# Patient Record
Sex: Female | Born: 1935 | Race: White | Hispanic: No | Smoking: Former smoker
Health system: Southern US, Community
[De-identification: ages and names within clinical notes are randomized; demographics above are authoritative.]

## PROBLEM LIST (undated history)

## (undated) DIAGNOSIS — S329XXA Fracture of unspecified parts of lumbosacral spine and pelvis, initial encounter for closed fracture: Secondary | ICD-10-CM

## (undated) DIAGNOSIS — R739 Hyperglycemia, unspecified: Secondary | ICD-10-CM

## (undated) DIAGNOSIS — E119 Type 2 diabetes mellitus without complications: Secondary | ICD-10-CM

## (undated) DIAGNOSIS — I639 Cerebral infarction, unspecified: Secondary | ICD-10-CM

## (undated) DIAGNOSIS — N200 Calculus of kidney: Secondary | ICD-10-CM

## (undated) DIAGNOSIS — E7800 Pure hypercholesterolemia, unspecified: Secondary | ICD-10-CM

## (undated) DIAGNOSIS — R0902 Hypoxemia: Secondary | ICD-10-CM

## (undated) DIAGNOSIS — I10 Essential (primary) hypertension: Secondary | ICD-10-CM

## (undated) HISTORY — PX: ABDOMINAL HYSTERECTOMY: SHX81

---

## 1999-05-01 ENCOUNTER — Encounter (INDEPENDENT_AMBULATORY_CARE_PROVIDER_SITE_OTHER): Payer: Self-pay | Admitting: Specialist

## 1999-05-01 ENCOUNTER — Ambulatory Visit (HOSPITAL_COMMUNITY): Admission: RE | Admit: 1999-05-01 | Discharge: 1999-05-01 | Payer: Self-pay | Admitting: Gastroenterology

## 2000-06-16 ENCOUNTER — Ambulatory Visit (HOSPITAL_COMMUNITY): Admission: RE | Admit: 2000-06-16 | Discharge: 2000-06-16 | Payer: Self-pay | Admitting: *Deleted

## 2000-06-16 ENCOUNTER — Encounter: Payer: Self-pay | Admitting: *Deleted

## 2000-09-28 ENCOUNTER — Encounter: Payer: Self-pay | Admitting: Internal Medicine

## 2000-09-28 ENCOUNTER — Encounter: Admission: RE | Admit: 2000-09-28 | Discharge: 2000-09-28 | Payer: Self-pay | Admitting: Internal Medicine

## 2000-09-30 ENCOUNTER — Encounter: Admission: RE | Admit: 2000-09-30 | Discharge: 2000-09-30 | Payer: Self-pay | Admitting: Internal Medicine

## 2000-09-30 ENCOUNTER — Encounter: Payer: Self-pay | Admitting: Internal Medicine

## 2002-03-23 ENCOUNTER — Ambulatory Visit (HOSPITAL_COMMUNITY): Admission: RE | Admit: 2002-03-23 | Discharge: 2002-03-23 | Payer: Self-pay | Admitting: Gastroenterology

## 2005-01-22 ENCOUNTER — Encounter: Admission: RE | Admit: 2005-01-22 | Discharge: 2005-01-22 | Payer: Self-pay | Admitting: Internal Medicine

## 2005-02-05 ENCOUNTER — Encounter: Admission: RE | Admit: 2005-02-05 | Discharge: 2005-02-05 | Payer: Self-pay | Admitting: Internal Medicine

## 2005-02-05 ENCOUNTER — Other Ambulatory Visit: Admission: RE | Admit: 2005-02-05 | Discharge: 2005-02-05 | Payer: Self-pay | Admitting: Diagnostic Radiology

## 2005-02-05 ENCOUNTER — Encounter (INDEPENDENT_AMBULATORY_CARE_PROVIDER_SITE_OTHER): Payer: Self-pay | Admitting: Specialist

## 2005-05-14 ENCOUNTER — Ambulatory Visit: Payer: Self-pay | Admitting: Hematology & Oncology

## 2005-05-15 ENCOUNTER — Ambulatory Visit: Payer: Self-pay | Admitting: Physical Medicine & Rehabilitation

## 2005-05-15 ENCOUNTER — Inpatient Hospital Stay (HOSPITAL_COMMUNITY): Admission: EM | Admit: 2005-05-15 | Discharge: 2005-05-19 | Payer: Self-pay | Admitting: Emergency Medicine

## 2005-05-19 ENCOUNTER — Inpatient Hospital Stay
Admission: RE | Admit: 2005-05-19 | Discharge: 2005-05-23 | Payer: Self-pay | Admitting: Physical Medicine & Rehabilitation

## 2005-05-29 ENCOUNTER — Ambulatory Visit: Payer: Self-pay | Admitting: Hematology & Oncology

## 2005-06-05 ENCOUNTER — Encounter (INDEPENDENT_AMBULATORY_CARE_PROVIDER_SITE_OTHER): Payer: Self-pay | Admitting: *Deleted

## 2005-06-05 ENCOUNTER — Ambulatory Visit (HOSPITAL_COMMUNITY): Admission: RE | Admit: 2005-06-05 | Discharge: 2005-06-05 | Payer: Self-pay | Admitting: Gastroenterology

## 2005-06-20 ENCOUNTER — Encounter: Admission: RE | Admit: 2005-06-20 | Discharge: 2005-06-20 | Payer: Self-pay | Admitting: Orthopedic Surgery

## 2005-08-12 ENCOUNTER — Ambulatory Visit: Payer: Self-pay | Admitting: Hematology & Oncology

## 2006-01-21 ENCOUNTER — Ambulatory Visit: Payer: Self-pay | Admitting: Hematology & Oncology

## 2006-03-11 ENCOUNTER — Encounter: Admission: RE | Admit: 2006-03-11 | Discharge: 2006-03-11 | Payer: Self-pay | Admitting: Internal Medicine

## 2006-03-18 ENCOUNTER — Ambulatory Visit (HOSPITAL_COMMUNITY): Admission: RE | Admit: 2006-03-18 | Discharge: 2006-03-18 | Payer: Self-pay | Admitting: Gastroenterology

## 2006-03-24 ENCOUNTER — Ambulatory Visit: Payer: Self-pay | Admitting: Hematology & Oncology

## 2006-03-26 LAB — CBC WITH DIFFERENTIAL/PLATELET
BASO%: 0.7 % (ref 0.0–2.0)
EOS%: 0 % (ref 0.0–7.0)
HCT: 40.7 % (ref 34.8–46.6)
LYMPH%: 31.5 % (ref 14.0–48.0)
MCH: 27.4 pg (ref 26.0–34.0)
MCHC: 33.1 g/dL (ref 32.0–36.0)
MCV: 82.6 fL (ref 81.0–101.0)
MONO#: 0.8 10*3/uL (ref 0.1–0.9)
NEUT%: 53.9 % (ref 39.6–76.8)
Platelets: 272 10*3/uL (ref 145–400)

## 2006-06-24 ENCOUNTER — Ambulatory Visit: Payer: Self-pay | Admitting: Hematology & Oncology

## 2006-06-25 LAB — CBC WITH DIFFERENTIAL/PLATELET
BASO%: 0.7 % (ref 0.0–2.0)
EOS%: 0 % (ref 0.0–7.0)
HCT: 37.6 % (ref 34.8–46.6)
LYMPH%: 21.6 % (ref 14.0–48.0)
MCH: 28.4 pg (ref 26.0–34.0)
MCHC: 33.4 g/dL (ref 32.0–36.0)
MONO%: 10.1 % (ref 0.0–13.0)
NEUT%: 67.6 % (ref 39.6–76.8)
Platelets: 269 10*3/uL (ref 145–400)

## 2006-09-22 ENCOUNTER — Ambulatory Visit: Payer: Self-pay | Admitting: Hematology & Oncology

## 2006-09-24 LAB — CBC WITH DIFFERENTIAL/PLATELET
BASO%: 0.7 % (ref 0.0–2.0)
Basophils Absolute: 0 10e3/uL (ref 0.0–0.1)
EOS%: 0 % (ref 0.0–7.0)
Eosinophils Absolute: 0 10e3/uL (ref 0.0–0.5)
HCT: 39.6 % (ref 34.8–46.6)
HGB: 13.1 g/dL (ref 11.6–15.9)
LYMPH%: 25.4 % (ref 14.0–48.0)
MCH: 28.1 pg (ref 26.0–34.0)
MCHC: 33.1 g/dL (ref 32.0–36.0)
MCV: 84.9 fL (ref 81.0–101.0)
MONO#: 0.7 10e3/uL (ref 0.1–0.9)
MONO%: 12.6 % (ref 0.0–13.0)
NEUT#: 3.6 10e3/uL (ref 1.5–6.5)
NEUT%: 61.3 % (ref 39.6–76.8)
Platelets: 299 10e3/uL (ref 145–400)
RBC: 4.67 10e6/uL (ref 3.70–5.32)
RDW: 14.3 % (ref 11.3–14.5)
WBC: 5.9 10e3/uL (ref 3.9–10.0)
lymph#: 1.5 10e3/uL (ref 0.9–3.3)

## 2006-09-24 LAB — CHCC SMEAR

## 2006-09-26 LAB — FERRITIN: Ferritin: 17 ng/mL (ref 10–291)

## 2006-12-18 ENCOUNTER — Ambulatory Visit: Payer: Self-pay | Admitting: Hematology & Oncology

## 2006-12-18 ENCOUNTER — Encounter (HOSPITAL_COMMUNITY): Admission: RE | Admit: 2006-12-18 | Discharge: 2007-03-11 | Payer: Self-pay | Admitting: Hematology & Oncology

## 2006-12-18 LAB — CBC WITH DIFFERENTIAL/PLATELET
BASO%: 1.1 % (ref 0.0–2.0)
LYMPH%: 23.9 % (ref 14.0–48.0)
MCHC: 32.7 g/dL (ref 32.0–36.0)
MCV: 75.3 fL — ABNORMAL LOW (ref 81.0–101.0)
MONO#: 0.6 10*3/uL (ref 0.1–0.9)
MONO%: 11.4 % (ref 0.0–13.0)
NEUT#: 3.1 10*3/uL (ref 1.5–6.5)
Platelets: 356 10*3/uL (ref 145–400)
RBC: 3.12 10*6/uL — ABNORMAL LOW (ref 3.70–5.32)
RDW: 15.6 % — ABNORMAL HIGH (ref 11.3–14.5)
WBC: 4.9 10*3/uL (ref 3.9–10.0)

## 2006-12-22 LAB — CBC WITH DIFFERENTIAL/PLATELET
BASO%: 0.8 % (ref 0.0–2.0)
EOS%: 0.1 % (ref 0.0–7.0)
MCH: 25.5 pg — ABNORMAL LOW (ref 26.0–34.0)
MCHC: 32.6 g/dL (ref 32.0–36.0)
MONO#: 0.6 10*3/uL (ref 0.1–0.9)
RBC: 4.18 10*6/uL (ref 3.70–5.32)
RDW: 17.5 % — ABNORMAL HIGH (ref 11.3–14.5)
WBC: 5.8 10*3/uL (ref 3.9–10.0)
lymph#: 1.2 10*3/uL (ref 0.9–3.3)

## 2006-12-22 LAB — TYPE & CROSSMATCH - CHCC

## 2006-12-24 LAB — TRANSFERRIN RECEPTOR, SOLUABLE: Transferrin Receptor, Soluble: 58.5 nmol/L

## 2006-12-24 LAB — FERRITIN: Ferritin: 8 ng/mL — ABNORMAL LOW (ref 10–291)

## 2007-01-20 LAB — CBC & DIFF AND RETIC
Basophils Absolute: 0 10*3/uL (ref 0.0–0.1)
EOS%: 0.1 % (ref 0.0–7.0)
HGB: 11.7 g/dL (ref 11.6–15.9)
IRF: 0.43 — ABNORMAL HIGH (ref 0.130–0.330)
MCH: 27.6 pg (ref 26.0–34.0)
MCV: 83.4 fL (ref 81.0–101.0)
MONO%: 8.5 % (ref 0.0–13.0)
NEUT#: 6.5 10*3/uL (ref 1.5–6.5)
RBC: 4.24 10*6/uL (ref 3.70–5.32)
RDW: 24.5 % — ABNORMAL HIGH (ref 11.3–14.5)
RETIC #: 134 10*3/uL — ABNORMAL HIGH (ref 19.7–115.1)
Retic %: 3.2 % — ABNORMAL HIGH (ref 0.4–2.3)
lymph#: 1 10*3/uL (ref 0.9–3.3)

## 2007-02-26 ENCOUNTER — Ambulatory Visit: Payer: Self-pay | Admitting: Hematology & Oncology

## 2007-03-03 LAB — CBC WITH DIFFERENTIAL/PLATELET
BASO%: 0.8 % (ref 0.0–2.0)
EOS%: 0.1 % (ref 0.0–7.0)
Eosinophils Absolute: 0 10*3/uL (ref 0.0–0.5)
LYMPH%: 22.8 % (ref 14.0–48.0)
MCH: 26.9 pg (ref 26.0–34.0)
MCHC: 33 g/dL (ref 32.0–36.0)
MCV: 81.7 fL (ref 81.0–101.0)
MONO%: 7.3 % (ref 0.0–13.0)
Platelets: 481 10*3/uL — ABNORMAL HIGH (ref 145–400)
RBC: 4.24 10*6/uL (ref 3.70–5.32)
RDW: 21.2 % — ABNORMAL HIGH (ref 11.3–14.5)

## 2007-03-03 LAB — FERRITIN: Ferritin: 181 ng/mL (ref 10–291)

## 2007-04-12 ENCOUNTER — Ambulatory Visit: Payer: Self-pay | Admitting: Hematology & Oncology

## 2007-05-19 LAB — CBC WITH DIFFERENTIAL/PLATELET
BASO%: 0.4 % (ref 0.0–2.0)
EOS%: 0.1 % (ref 0.0–7.0)
HCT: 38.2 % (ref 34.8–46.6)
MCH: 27.5 pg (ref 26.0–34.0)
MCHC: 34 g/dL (ref 32.0–36.0)
NEUT%: 60.9 % (ref 39.6–76.8)
RBC: 4.71 10*6/uL (ref 3.70–5.32)
WBC: 5.9 10*3/uL (ref 3.9–10.0)
lymph#: 1.7 10*3/uL (ref 0.9–3.3)

## 2007-05-19 LAB — FERRITIN: Ferritin: 32 ng/mL (ref 10–291)

## 2007-06-25 ENCOUNTER — Ambulatory Visit: Payer: Self-pay | Admitting: Hematology & Oncology

## 2007-06-30 LAB — CBC WITH DIFFERENTIAL/PLATELET
Basophils Absolute: 0.1 10*3/uL (ref 0.0–0.1)
Eosinophils Absolute: 0 10*3/uL (ref 0.0–0.5)
HCT: 40.3 % (ref 34.8–46.6)
LYMPH%: 23.2 % (ref 14.0–48.0)
MCH: 27.6 pg (ref 26.0–34.0)
MCHC: 33.8 g/dL (ref 32.0–36.0)
MONO#: 0.6 10*3/uL (ref 0.1–0.9)
MONO%: 10.2 % (ref 0.0–13.0)
RBC: 4.93 10*6/uL (ref 3.70–5.32)
RDW: 15.4 % — ABNORMAL HIGH (ref 11.3–14.5)
lymph#: 1.4 10*3/uL (ref 0.9–3.3)

## 2007-08-16 ENCOUNTER — Ambulatory Visit: Payer: Self-pay | Admitting: Hematology & Oncology

## 2007-08-18 LAB — CBC WITH DIFFERENTIAL/PLATELET
BASO%: 0.7 % (ref 0.0–2.0)
Basophils Absolute: 0 10*3/uL (ref 0.0–0.1)
HCT: 37.1 % (ref 34.8–46.6)
HGB: 12.7 g/dL (ref 11.6–15.9)
LYMPH%: 31.2 % (ref 14.0–48.0)
MCH: 28.2 pg (ref 26.0–34.0)
MCHC: 34.3 g/dL (ref 32.0–36.0)
MONO#: 0.6 10*3/uL (ref 0.1–0.9)
NEUT%: 56.9 % (ref 39.6–76.8)
Platelets: 254 10*3/uL (ref 145–400)
WBC: 5.6 10*3/uL (ref 3.9–10.0)
lymph#: 1.8 10*3/uL (ref 0.9–3.3)

## 2007-10-11 ENCOUNTER — Ambulatory Visit: Payer: Self-pay | Admitting: Hematology & Oncology

## 2007-10-13 LAB — CBC & DIFF AND RETIC
Eosinophils Absolute: 0 10*3/uL (ref 0.0–0.5)
HCT: 36.5 % (ref 34.8–46.6)
LYMPH%: 30.1 % (ref 14.0–48.0)
MCV: 82.9 fL (ref 81.0–101.0)
MONO#: 0.6 10*3/uL (ref 0.1–0.9)
MONO%: 11 % (ref 0.0–13.0)
NEUT#: 2.9 10*3/uL (ref 1.5–6.5)
NEUT%: 57.7 % (ref 39.6–76.8)
Platelets: 288 10*3/uL (ref 145–400)
RBC: 4.4 10*6/uL (ref 3.70–5.32)
Retic %: 2.4 % — ABNORMAL HIGH (ref 0.4–2.3)

## 2007-10-13 LAB — FERRITIN: Ferritin: 521 ng/mL — ABNORMAL HIGH (ref 10–291)

## 2007-12-20 ENCOUNTER — Ambulatory Visit: Payer: Self-pay | Admitting: Hematology & Oncology

## 2007-12-22 LAB — CBC & DIFF AND RETIC
Basophils Absolute: 0 10*3/uL (ref 0.0–0.1)
Eosinophils Absolute: 0 10*3/uL (ref 0.0–0.5)
HCT: 39.8 % (ref 34.8–46.6)
HGB: 13.5 g/dL (ref 11.6–15.9)
LYMPH%: 17.9 % (ref 14.0–48.0)
MCV: 83.5 fL (ref 81.0–101.0)
MONO#: 0.8 10*3/uL (ref 0.1–0.9)
MONO%: 8.9 % (ref 0.0–13.0)
NEUT#: 6.4 10*3/uL (ref 1.5–6.5)
Platelets: 285 10*3/uL (ref 145–400)
RBC: 4.76 10*6/uL (ref 3.70–5.32)
Retic %: 1.4 % (ref 0.4–2.3)
WBC: 8.8 10*3/uL (ref 3.9–10.0)

## 2007-12-22 LAB — FERRITIN: Ferritin: 82 ng/mL (ref 10–291)

## 2007-12-30 ENCOUNTER — Other Ambulatory Visit: Payer: Self-pay | Admitting: Emergency Medicine

## 2007-12-30 ENCOUNTER — Other Ambulatory Visit: Payer: Self-pay | Admitting: Internal Medicine

## 2007-12-30 ENCOUNTER — Inpatient Hospital Stay (HOSPITAL_COMMUNITY): Admission: AD | Admit: 2007-12-30 | Discharge: 2008-01-02 | Payer: Self-pay | Admitting: Internal Medicine

## 2008-03-21 ENCOUNTER — Ambulatory Visit: Payer: Self-pay | Admitting: Hematology & Oncology

## 2008-06-20 ENCOUNTER — Ambulatory Visit: Payer: Self-pay | Admitting: Hematology & Oncology

## 2008-09-20 ENCOUNTER — Ambulatory Visit: Payer: Self-pay | Admitting: Hematology & Oncology

## 2008-09-21 LAB — CBC WITH DIFFERENTIAL (CANCER CENTER ONLY)
BASO#: 0 10*3/uL (ref 0.0–0.2)
BASO%: 0.6 % (ref 0.0–2.0)
EOS%: 0.7 % (ref 0.0–7.0)
HCT: 35.7 % (ref 34.8–46.6)
HGB: 11.9 g/dL (ref 11.6–15.9)
MCH: 26.8 pg (ref 26.0–34.0)
MCHC: 33.4 g/dL (ref 32.0–36.0)
MONO%: 9.7 % (ref 0.0–13.0)
NEUT#: 3.3 10*3/uL (ref 1.5–6.5)
NEUT%: 65.4 % (ref 39.6–80.0)
RDW: 12.5 % (ref 10.5–14.6)

## 2008-09-21 LAB — CHCC SATELLITE - SMEAR

## 2008-09-22 LAB — LIPID PANEL
Cholesterol: 204 mg/dL — ABNORMAL HIGH (ref 0–200)
HDL: 47 mg/dL (ref >39)
Total CHOL/HDL Ratio: 4.3 Ratio
Triglycerides: 166 mg/dL — ABNORMAL HIGH (ref <150)
VLDL: 33 mg/dL (ref 0–40)

## 2008-09-22 LAB — COMPREHENSIVE METABOLIC PANEL
Albumin: 3.6 g/dL (ref 3.5–5.2)
BUN: 17 mg/dL (ref 6–23)
CO2: 29 mEq/L (ref 19–32)
Glucose, Bld: 116 mg/dL — ABNORMAL HIGH (ref 70–99)
Sodium: 146 mEq/L — ABNORMAL HIGH (ref 135–145)
Total Bilirubin: 0.4 mg/dL (ref 0.3–1.2)
Total Protein: 6.1 g/dL (ref 6.0–8.3)

## 2008-09-22 LAB — VITAMIN D 25 HYDROXY (VIT D DEFICIENCY, FRACTURES): Vit D, 25-Hydroxy: 16 ng/mL — ABNORMAL LOW (ref 30–89)

## 2009-01-03 ENCOUNTER — Ambulatory Visit: Payer: Self-pay | Admitting: Hematology & Oncology

## 2009-01-11 LAB — CBC WITH DIFFERENTIAL (CANCER CENTER ONLY)
BASO#: 0 10*3/uL (ref 0.0–0.2)
BASO%: 0.2 % (ref 0.0–2.0)
EOS%: 0.9 % (ref 0.0–7.0)
HCT: 29.2 % — ABNORMAL LOW (ref 34.8–46.6)
HGB: 9 g/dL — ABNORMAL LOW (ref 11.6–15.9)
LYMPH#: 1 10*3/uL (ref 0.9–3.3)
LYMPH%: 21.9 % (ref 14.0–48.0)
MCHC: 30.8 g/dL — ABNORMAL LOW (ref 32.0–36.0)
MCV: 65 fL — ABNORMAL LOW (ref 81–101)
NEUT%: 67.9 % (ref 39.6–80.0)
RDW: 14.6 % (ref 10.5–14.6)

## 2009-02-08 LAB — CBC WITH DIFFERENTIAL (CANCER CENTER ONLY)
BASO#: 0 10*3/uL (ref 0.0–0.2)
BASO%: 0.6 % (ref 0.0–2.0)
Eosinophils Absolute: 0.1 10*3/uL (ref 0.0–0.5)
HCT: 41.2 % (ref 34.8–46.6)
HGB: 12.8 g/dL (ref 11.6–15.9)
LYMPH#: 1.2 10*3/uL (ref 0.9–3.3)
LYMPH%: 23.9 % (ref 14.0–48.0)
MCV: 77 fL — ABNORMAL LOW (ref 81–101)
MONO#: 0.4 10*3/uL (ref 0.1–0.9)
NEUT%: 66.1 % (ref 39.6–80.0)
RBC: 5.33 10*6/uL — ABNORMAL HIGH (ref 3.70–5.32)
RDW: 18.7 % — ABNORMAL HIGH (ref 10.5–14.6)
WBC: 4.9 10*3/uL (ref 3.9–10.0)

## 2009-04-04 ENCOUNTER — Ambulatory Visit: Payer: Self-pay | Admitting: Hematology & Oncology

## 2009-05-19 ENCOUNTER — Inpatient Hospital Stay (HOSPITAL_COMMUNITY): Admission: EM | Admit: 2009-05-19 | Discharge: 2009-05-25 | Payer: Self-pay | Admitting: Emergency Medicine

## 2009-05-21 ENCOUNTER — Ambulatory Visit: Payer: Self-pay | Admitting: Physical Medicine & Rehabilitation

## 2009-05-21 ENCOUNTER — Ambulatory Visit: Payer: Self-pay | Admitting: *Deleted

## 2009-05-21 ENCOUNTER — Encounter (INDEPENDENT_AMBULATORY_CARE_PROVIDER_SITE_OTHER): Payer: Self-pay | Admitting: *Deleted

## 2009-05-22 ENCOUNTER — Encounter (INDEPENDENT_AMBULATORY_CARE_PROVIDER_SITE_OTHER): Payer: Self-pay | Admitting: Internal Medicine

## 2009-05-25 ENCOUNTER — Ambulatory Visit: Payer: Self-pay | Admitting: Physical Medicine & Rehabilitation

## 2009-05-25 ENCOUNTER — Inpatient Hospital Stay (HOSPITAL_COMMUNITY)
Admission: RE | Admit: 2009-05-25 | Discharge: 2009-05-31 | Payer: Self-pay | Admitting: Physical Medicine & Rehabilitation

## 2010-01-02 ENCOUNTER — Ambulatory Visit: Payer: Self-pay | Admitting: Hematology & Oncology

## 2010-01-04 LAB — CBC WITH DIFFERENTIAL (CANCER CENTER ONLY)
BASO#: 0 10*3/uL (ref 0.0–0.2)
BASO%: 0.6 % (ref 0.0–2.0)
HCT: 33.8 % — ABNORMAL LOW (ref 34.8–46.6)
LYMPH%: 27.5 % (ref 14.0–48.0)
MCHC: 30.9 g/dL — ABNORMAL LOW (ref 32.0–36.0)
MCV: 68 fL — ABNORMAL LOW (ref 81–101)
MONO#: 0.7 10*3/uL (ref 0.1–0.9)
NEUT%: 61.4 % (ref 39.6–80.0)
RDW: 14.3 % (ref 10.5–14.6)
WBC: 6.8 10*3/uL (ref 3.9–10.0)

## 2010-01-04 LAB — RETICULOCYTES (CHCC): ABS Retic: 54.1 10*3/uL (ref 19.0–186.0)

## 2010-02-27 ENCOUNTER — Ambulatory Visit: Payer: Self-pay | Admitting: Hematology & Oncology

## 2010-03-01 LAB — CBC WITH DIFFERENTIAL (CANCER CENTER ONLY)
Eosinophils Absolute: 0 10*3/uL (ref 0.0–0.5)
LYMPH%: 31.2 % (ref 14.0–48.0)
MONO#: 0.4 10*3/uL (ref 0.1–0.9)
NEUT%: 59.8 % (ref 39.6–80.0)
RBC: 5.62 10*6/uL — ABNORMAL HIGH (ref 3.70–5.32)
RDW: 20.9 % — ABNORMAL HIGH (ref 10.5–14.6)
WBC: 5.5 10*3/uL (ref 3.9–10.0)

## 2010-03-01 LAB — RETICULOCYTES (CHCC)
ABS Retic: 43.8 10*3/uL (ref 19.0–186.0)
RBC.: 5.48 MIL/uL — ABNORMAL HIGH (ref 3.87–5.11)

## 2010-05-15 ENCOUNTER — Ambulatory Visit: Payer: Self-pay | Admitting: Hematology & Oncology

## 2010-06-13 LAB — CBC WITH DIFFERENTIAL (CANCER CENTER ONLY)
BASO%: 0.5 % (ref 0.0–2.0)
LYMPH%: 31.2 % (ref 14.0–48.0)
MCH: 29.3 pg (ref 26.0–34.0)
MCV: 87 fL (ref 81–101)
MONO%: 9.5 % (ref 0.0–13.0)
NEUT#: 3.1 10*3/uL (ref 1.5–6.5)
Platelets: 212 10*3/uL (ref 145–400)
RDW: 12.2 % (ref 10.5–14.6)

## 2010-06-13 LAB — FERRITIN: Ferritin: 18 ng/mL (ref 10–291)

## 2010-06-13 LAB — RETICULOCYTES (CHCC): RBC.: 5.11 MIL/uL (ref 3.87–5.11)

## 2010-09-11 ENCOUNTER — Ambulatory Visit: Payer: Self-pay | Admitting: Hematology & Oncology

## 2010-09-12 LAB — CBC WITH DIFFERENTIAL (CANCER CENTER ONLY)
BASO%: 0.6 % (ref 0.0–2.0)
EOS%: 0.6 % (ref 0.0–7.0)
HCT: 43.3 % (ref 34.8–46.6)
LYMPH%: 28.7 % (ref 14.0–48.0)
MCHC: 32.9 g/dL (ref 32.0–36.0)
MCV: 85 fL (ref 81–101)
MONO%: 10.4 % (ref 0.0–13.0)
NEUT%: 59.7 % (ref 39.6–80.0)
Platelets: 231 10*3/uL (ref 145–400)
RDW: 11.8 % (ref 10.5–14.6)

## 2010-09-12 LAB — TECHNOLOGIST REVIEW CHCC SATELLITE

## 2010-09-12 LAB — RETICULOCYTES (CHCC): RBC.: 4.95 MIL/uL (ref 3.87–5.11)

## 2010-09-12 LAB — CHCC SATELLITE - SMEAR

## 2010-12-09 ENCOUNTER — Ambulatory Visit: Payer: Self-pay | Admitting: Hematology & Oncology

## 2011-02-16 LAB — LIPID PANEL
Triglycerides: 85 mg/dL (ref <150)
VLDL: 17 mg/dL (ref 0–40)

## 2011-02-16 LAB — COMPREHENSIVE METABOLIC PANEL
AST: 28 U/L (ref 0–37)
Albumin: 2.8 g/dL — ABNORMAL LOW (ref 3.5–5.2)
Calcium: 8.9 mg/dL (ref 8.4–10.5)
Creatinine, Ser: 0.86 mg/dL (ref 0.4–1.2)
Sodium: 141 mEq/L (ref 135–145)
Total Protein: 5.6 g/dL — ABNORMAL LOW (ref 6.0–8.3)

## 2011-02-16 LAB — DIFFERENTIAL
Basophils Absolute: 0.1 10*3/uL (ref 0.0–0.1)
Basophils Absolute: 0 10*3/uL (ref 0.0–0.1)
Basophils Relative: 0 % (ref 0–1)
Eosinophils Absolute: 0 10*3/uL (ref 0.0–0.7)
Eosinophils Relative: 0 % (ref 0–5)
Lymphocytes Relative: 29 % (ref 12–46)
Neutro Abs: 2.9 10*3/uL (ref 1.7–7.7)

## 2011-02-16 LAB — URINE CULTURE
Colony Count: 25000
Special Requests: NEGATIVE

## 2011-02-16 LAB — POCT CARDIAC MARKERS
CKMB, poc: 2.8 ng/mL (ref 1.0–8.0)
Troponin i, poc: <0.05 ng/mL (ref 0.00–0.09)

## 2011-02-16 LAB — BASIC METABOLIC PANEL
BUN: 16 mg/dL (ref 6–23)
BUN: 19 mg/dL (ref 6–23)
BUN: 9 mg/dL (ref 6–23)
CO2: 29 mEq/L (ref 19–32)
CO2: 30 mEq/L (ref 19–32)
Chloride: 106 mEq/L (ref 96–112)
Chloride: 106 mEq/L (ref 96–112)
Chloride: 108 mEq/L (ref 96–112)
Creatinine, Ser: 0.96 mg/dL (ref 0.4–1.2)
GFR calc Af Amer: >60 mL/min (ref >60)
GFR calc non Af Amer: 51 mL/min — ABNORMAL LOW (ref >60)
GFR calc non Af Amer: >60 mL/min (ref >60)
Glucose, Bld: 104 mg/dL — ABNORMAL HIGH (ref 70–99)
Glucose, Bld: 95 mg/dL (ref 70–99)
Potassium: 3.5 mEq/L (ref 3.5–5.1)
Potassium: 4 mEq/L (ref 3.5–5.1)
Potassium: 4.1 mEq/L (ref 3.5–5.1)
Sodium: 141 mEq/L (ref 135–145)

## 2011-02-16 LAB — CBC
HCT: 36.6 % (ref 36.0–46.0)
HCT: 36.8 % (ref 36.0–46.0)
HCT: 41.6 % (ref 36.0–46.0)
Hemoglobin: 12.2 g/dL (ref 12.0–15.0)
MCHC: 33.6 g/dL (ref 30.0–36.0)
MCHC: 33 g/dL (ref 30.0–36.0)
MCV: 84.5 fL (ref 78.0–100.0)
MCV: 83.4 fL (ref 78.0–100.0)
MCV: 84.6 fL (ref 78.0–100.0)
Platelets: 195 10*3/uL (ref 150–400)
Platelets: 246 10*3/uL (ref 150–400)
RBC: 4.33 MIL/uL (ref 3.87–5.11)
RBC: 4.34 MIL/uL (ref 3.87–5.11)
RDW: 13.3 % (ref 11.5–15.5)
WBC: 5.4 10*3/uL (ref 4.0–10.5)
WBC: 7.3 10*3/uL (ref 4.0–10.5)

## 2011-02-16 LAB — URINALYSIS, MICROSCOPIC ONLY
Bilirubin Urine: NEGATIVE
Leukocytes, UA: NEGATIVE
Nitrite: NEGATIVE
Specific Gravity, Urine: 1.011 (ref 1.005–1.030)
Urobilinogen, UA: 1 mg/dL (ref 0.0–1.0)
pH: 7 (ref 5.0–8.0)

## 2011-02-16 LAB — MAGNESIUM: Magnesium: 2.2 mg/dL (ref 1.5–2.5)

## 2011-02-16 LAB — HEPATIC FUNCTION PANEL
AST: 20 U/L (ref 0–37)
Alkaline Phosphatase: 69 U/L (ref 39–117)
Bilirubin, Direct: 0.1 mg/dL (ref 0.0–0.3)
Total Bilirubin: 0.6 mg/dL (ref 0.3–1.2)

## 2011-02-16 LAB — URINALYSIS, ROUTINE W REFLEX MICROSCOPIC
Nitrite: POSITIVE — AB
Specific Gravity, Urine: 1.016 (ref 1.005–1.030)
Urobilinogen, UA: 0.2 mg/dL (ref 0.0–1.0)
pH: 5.5 (ref 5.0–8.0)

## 2011-02-16 LAB — RPR: RPR Ser Ql: NONREACTIVE

## 2011-02-16 LAB — CARDIAC PANEL(CRET KIN+CKTOT+MB+TROPI)
Relative Index: 3.2 — ABNORMAL HIGH (ref 0.0–2.5)
Troponin I: 0.02 ng/mL (ref 0.00–0.06)

## 2011-02-16 LAB — SEDIMENTATION RATE: Sed Rate: 19 mm/hr (ref 0–22)

## 2011-02-16 LAB — BRAIN NATRIURETIC PEPTIDE: Pro B Natriuretic peptide (BNP): 35.9 pg/mL (ref 0.0–100.0)

## 2011-02-16 LAB — URINE MICROSCOPIC-ADD ON

## 2011-02-16 LAB — D-DIMER, QUANTITATIVE: D-Dimer, Quant: 0.51 ug/mL-FEU — ABNORMAL HIGH (ref 0.00–0.48)

## 2011-03-25 NOTE — H&P (Signed)
NAMELAVORA, BRISBON                  ACCOUNT NO.:  [id number]   MEDICAL RECORD NO.:  [id number]          PATIENT TYPE:  IPS   LOCATION:  4037                         FACILITY:  [facility]   PHYSICIAN:  Ranelle Oyster, M.D.DATE OF BIRTH:  05/22/1919   DATE OF ADMISSION:  05/25/2009  DATE OF DISCHARGE:                              HISTORY & PHYSICAL   PRIMARY CARE PHYSICIAN:  Soyla Murphy. Renne Crigler, MD   HISTORY OF PRESENT ILLNESS:  This is an 77 year old white female with  history of hypertension and gait disorder.  She was admitted on May 18, 2009, with inability to walk.  There is some question of her anxiety.  MRI of the head and spine showed chronic T11 fracture.  CTA of the chest  was negative.  She was noted to have an E. coli UTI treated with Cipro.  It was felt that her shortness of breath was multifactorial, possibly  secondary to mild COPD, hiatal hernia, obesity, etc.  She had a run of V-  tach on the day of admission, and a 2-D echo was done showing left  ventricular was essentially normal with normal EF.  She was treated for  low potassium with supplementation and started on metoprolol 12.5 mg  b.i.d.  Rehab consulted on the patient on May 21, 2009, and felt that  she could potentially benefit from an inpatient rehab admission and  ultimately she was brought here today.   PAST MEDICAL HISTORY:  Notable for:  1. Hypertension.  2. Diverticular bleeding.  3. Depression.  4. Osteoporosis.  5. Gait disorder.  6. Also has anemia.  7. History of pubic rami fracture.  8. Appendectomy.  9. Hysterectomy.  10.T and A.   Family history is noncontributory.   REVIEW OF SYSTEMS:  Notable for dyspnea on exertion, coughing,  generalized weakness.  She denies frank back pain.  Other pertinent  positives are as above and full 14-point review is in the written H and  P.   SOCIAL HISTORY:  The patient lives alone, independent prior to arrival.  She does not drink or smoke.  She has a  daughter who lives next door,  who can check on her frequently.  She has 1-level house with no steps to  enter.   MEDICATIONS UPON ADMISSION:  Crestor, Norvasc, calcium and vitamin D,  Cipro 250 b.i.d., metoprolol, and Combivent inhaler q.4 h. p.r.n.   ALLERGIES:  None.   LABORATORY DATA:  Vitamin D level 15.  Beta-natriuretic peptide 35.  Magnesium 2.2, sodium 139, potassium 4, BUN 16, creatinine 0.96.  Hemoglobin 12.2, white count 7.3, platelets 195.  Urine culture notable  for 100,000 colon of E. coli UTI.   PHYSICAL EXAMINATION:  VITAL SIGNS:  Blood pressure is 119/58, pulse 72,  respiratory rate 20, temperature 97.5.  The patient is sating 95% on  room air.  GENERAL:  The patient is pleasant, in no acute distress.  She is alert  and oriented x3.  She is of short stature and moderately obese.  EXTREMITIES:  She has a few bruises noted on the arms  and extremities.  She has trace edema on both legs.  HEART:  Regular rate and rhythm without murmur, rubs, or gallops.  CHEST:  Generally clear with occasional wheeze.  ABDOMEN:  Slightly distended, but otherwise, nontender.  NEUROLOGIC:  Cranial nerves II through XII are intact.  Reflexes are 1+.  Sensation is generally normal 2/2 throughout all 4 extremities.  Strength was essentially 4/5 proximal upper extremities to 4+/5  distally.  Lower extremities strength was 3/5 proximal to 4/5 distally.  Judgment, orientation, memory, and mood are all within functional  limits.   POST-ADMISSION PHYSICIAN EVALUATION:  1. Functional deficits secondary to T11 compression fracture and      chronic gait disorder, subsequent deconditioning.  2. The patient is admitted to receive collaborative interdisciplinary      care between the physiatrist, rehab nursing staff, and therapy      team.  3. The patient's level of medical complexity and substantial therapy      needs in context of that medical necessity cannot be provided at a      lesser  intensity of care.  4. The patient has experienced substantial functional loss from her      baseline.  Premorbidly, she was independent albeit potentially      unsafe using a walker, cane, and furniture for balance.  Most      recently with therapy, she has been min to mod assist with basic      transfers and min to mod with ADLs.  Judging by the patient's      diagnosis, physical exam, and functional history, she has a      potential for functional progress, which will result in measurable      gains while an inpatient rehab.  These gains will be of substantial      and practical use upon discharge to home facilitating mobility and      self-care.  Interim changes in medical status since rehab consult      are detailed above.  5. Physiatrist will provide 24-hour management of the medical needs as      well as oversight of therapy plan/treatment and provide guidance as      appropriate regarding interaction of the two.  Medical problem list      and plan are listed below.  6. The 24-hour rehab nursing will assist in management of the      patient's skin care, safety, pain, nutrition, medication      administration, and integration of therapy concepts, techniques,      and education.  7. PT will assess and treat for lower extremity strength, range of      motion, stability, balance, adaptive equipment and techniques, and      safety awareness and family education with goals supervision to      modified independent.  8. OT will assess and treat for upper extremity use, adaptive      techniques and equipment, safety awareness, family education with      goals supervision to occasional modified independent.  9. Case management and social worker will assess and treat for      psychosocial issues and discharge planning.  10.Team conferences will be held weekly to assess progress towards      goals and to determine barriers at discharge.  11.The patient has demonstrated a sufficient medical  stability,      exercise capacity to tolerate at least 3 hours of therapy per day  at least 5 days per week.  12.Estimated length of stay is approximately 7-10 days.  Prognosis is      good.   MEDICAL PROBLEM LIST AND PLAN:  1. Escherichia coli urinary tract infection:  Continue Cipro 250 mg      b.i.d. for 3 additional days to completion.  Consider recheck of      urine culture at that point.  2. Shortness of breath:  Combivent inhaler p.r.n. and oxygen wean.      The patient may have mild case of COPD.  3. Hypokalemia:  The patient's potassium was supplemented.  We will      recheck level on Monday morning.  4. Hypertension:  Currently, the patient is eutensive on Norvasc 2.5      daily, metoprolol 12.5 b.i.d.  5. Vitamin D deficiency:  Calcium plus vitamin D 1 tablet twice daily.  6. Hyperlipidemia:  Crestor 5 mg daily.  7. Deep venous thrombosis prophylaxis:  Subcu Lovenox 5000 units q.8      h. for now until mobility increases sufficiently.  Knee-high TED      hose.  8. Pain management:  P.r.n. Tylenol for now.  The patient denied pain      on examination this morning.      Ranelle Oyster, M.D.  Electronically Signed     ZTS/MEDQ  D:  05/25/2009  T:  05/26/2009  Job:  [id number]   cc:   Soyla Murphy. Renne Crigler, M.D.

## 2011-03-25 NOTE — H&P (Signed)
Savannah [name], Savannah [name]                  ACCOUNT NO.:  [id number]   MEDICAL RECORD NO.:  [id number]          PATIENT TYPE:  EMS   LOCATION:  ED                           FACILITY:  [facility]   PHYSICIAN:  Savannah D. Prime, MD    DATE OF BIRTH:  [birth year]   DATE OF ADMISSION:  12/30/2007  DATE OF DISCHARGE:                              HISTORY & PHYSICAL   PRIMARY CARE PHYSICIAN:  Savannah Murphy. Renne Crigler, M.D.   The patient's GI doctor for her son is Savannah [name]. Savannah Fireman, M.D.   CHIEF COMPLAINT:  Bleeding.   HISTORY OF PRESENT ILLNESS:  Savannah [name] is an 77 year old female with a  history of microcytic anemia, history of severe diverticulosis with a  history of GI bleeding and heme-positive stools, who presents with a  history of waking up around 3 a.m. on the day of admission surrounded in  blood.  She notes she tried to walk to the bathroom but very lightheaded  with this, with blood trailing her.  She notes she did take a bath but  was exhausted after the bath to where she had to crawl to the telephone  to call her son.  Patient brought here with vital signs  stable.  She  has been taking B.C. and Goody Powders for severe headaches in the month  of January.  This was discontinued at the end of January.  The patient  denies any hematemesis.  The blood was bright red from below.  She  denies any fevers.  She denies any cough.  The patient was Hemoccult  fecal positive in the ER.   PAST MEDICAL HISTORY:  1. She has a history of lower GI bleed.  Her last hematocrit before      now shows a hemoglobin of 10.3, hematocrit 31.7 May 20, 2005.  2. She has a history of hysterectomy.  3. She has a history of right inferior superior pelvic fractures in      2006.  She was not operated on.  She has since used a cane.  4. History of hypertension.  5. History of depression.  6. History of osteoporosis.  7. History of adenomatous colon polyp.   ALLERGIES:  No known drug allergies.   MEDICATIONS:  She is on  Effexor, unsure of the dose.   SOCIAL HISTORY:  No history of tobacco, alcohol or drug use.  She lives  alone and she is very independent.   FAMILY HISTORY:  Father passed away of an MI at the age of 39.  Mother  passed away with stroke.   REVIEW OF SYSTEMS:  A 14-point review of systems negative except as  stated above.   PHYSICAL EXAMINATION:  VITAL SIGNS:  Temperature is 97.5, blood pressure  136/68, pulse of 84, respiratory rate of 16, saturations are 95% on room  air.  GENERAL:  She is a female who looks her stated age, in no acute  distress.  HEENT:  Normocephalic, atraumatic.  Pupils equal, round, and reactive to  light.  Extraocular movements intact.  Conjunctivae are mildly pale.  The oropharynx  is dry.  NECK:  Supple with no lymphadenopathy.  LUNGS:  Clear to auscultation bilaterally.  CARDIOVASCULAR:  Regular rhythm and rate with no murmurs.  ABDOMEN:  Obese, soft, nontender, nondistended.  EXTREMITIES:  No clubbing, cyanosis, or edema.  NEUROLOGIC:  She is alert and oriented x4.  Cranial nerves II-XII  grossly intact.   LABORATORY DATA:  White count of 8.3, hemoglobin 11.2, hematocrit 33.5,  platelets 254, segs of 79, lymphocytes 14.  Total protein 5.4, albumin  2.9, AST 15, ALT 11, alkaline phosphatase 52, total bilirubin 0.5.  Sodium 138, potassium 3.7, chloride 103, bicarb 29, glucose 159, BUN 21,  creatinine 0.81, calcium 8.4.   ASSESSMENT AND PLAN:  This is a patient with a history of severe  diverticulosis and adenomatous polyps who presents with gastrointestinal  bleeding and severe lightheadedness.  Orthostatics pending.  We will  type and cross her with a low threshold to transfuse her.  She will be  placed on high-dose proton pump inhibitor and also get gastroenterology  to see her.  We will counsel her concerning the use of nonsteroidal anti-  inflammatory drugs and will follow her hematocrits closely.  She  requests Dr. Madilyn Fireman to see her.  DVT  prophylaxis will be pneumatic  compression devices.      Savannah D. Prime, MD  Electronically Signed     DDP/MEDQ  D:  12/30/2007  T:  12/30/2007  Job:  [id number]

## 2011-03-25 NOTE — Discharge Summary (Signed)
NAMESYMPHONI, Savannah [name]                  ACCOUNT NO.:  [id number]   MEDICAL RECORD NO.:  [id number]          PATIENT TYPE:  IPS   LOCATION:  4037                         FACILITY:  [facility]   PHYSICIAN:  Ranelle Oyster, M.D.DATE OF BIRTH:  [birth year]   DATE OF ADMISSION:  05/25/2009  DATE OF DISCHARGE:  05/31/2009                               DISCHARGE SUMMARY   DISCHARGE DIAGNOSES:  1. Deconditioning secondary gait disorder and T11 fracture.  2. Escherichia coli urinary tract infection.  3. Hypokalemia.  4. Hypertension.   HISTORY OF PRESENT ILLNESS:  Savannah [name] is an 77 year old female with  history of hypertension, low back pain, and problems with mobility,  admitted on July 9 with inability to walk past sustaining a fall.  MRI  of brain and spine showed chronic T11 fracture.  CT of chest showed no  evidence of PE.  The patient was noted to have E. coli UTI and was  started on Cipro for treatment.  Therapies initiated and the patient is  noted to be limited by shortness of breath as well as dyspnea on  exertion.  She was noted to have poor standing balance and difficulty  ambulating due to issues with dizziness.  She was noted to have an  episode of V-tach on day of admission.  A 2-D echo done showed normal  EF, and the patient was started on low-dose metoprolol.  The patient was  evaluated by rehab and felt to be an appropriate candidate for CIR  program.   PAST MEDICAL HISTORY:  Significant for:  1. Hypertension.  2. Anemia.  3. Anxiety.  4. Pubic rami fracture in 2006.  5. Appendectomy.  6. Hysterectomy.  7. T&A.  8. Large hiatal hernia.  9. History of depression.  10.History of diverticular bleed.   ALLERGIES:  No known drug allergies.   FAMILY HISTORY:  Noncontributory.   SOCIAL HISTORY:  The patient lives alone in 1-level home with no steps  at entry.  Does not use any tobacco or alcohol.  Daughter lives next to  and checks in daily.   FUNCTIONAL HISTORY:  The  patient was independent with cane and walker  prior to admission.   FUNCTIONAL STATUS:  The patient is min guard assist for lower body  dressing and grooming with DOE noted, min to mod assist for transfers,  mod assist ambulating 2-3 steps limited by dizziness.   PHYSICAL EXAMINATION:  VITAL SIGNS:  Blood pressure 119/58, pulse 72,  respiratory rate 20, and temperature 97.5.  GENERAL:  The patient is a pleasant, elderly female, alert and oriented  x3, moderately obese, no acute distress.  HEART:  Regular rate rhythm without murmurs or gallops.  LUNGS:  Clear to auscultation bilaterally with occasional wheezing  noted.  EXTREMITIES:  Few bruises on arms and legs with trace edema in bilateral  lower extremity.  ABDOMEN:  Soft and nontender with positive bowel sounds.  NEUROLOGIC:  Cranial nerves II through XII are intact.  Reflexes 1+.  Sensation generally normal 2/2 through all 4 extremities.  Strength was  essentially 4/5 proximal upper  extremity and 4+/5 distally.  Lower  extremity strength is 3/5 proximally and 4/5 distally.  Judgment,  orientation, memory, and mood are all within functional limits.   HOSPITAL COURSE:  Savannah [name] was admitted to rehab on May 25, 2009,  for inpatient therapies to consist of PT and OT at least 3 hours a day.  Past admission physiatrist, rehab RN, and therapy team have worked  together to provide customized collaborative interdisciplinary care.  The patient was maintained on Cipro for 3 additional days to complete  her treatment for E. coli UTI.  The patient's blood pressures were  monitored on b.i.d. basis for evidence of orthostasis.  The patient has  had no complaints of dizziness during this stay.  Blood pressures at the  time of discharge ranging from 130s to 150s systolic and 60s to 70s  diastolic.  Last weight is at 72 kgs.  Labs done past admission showed  issues with hypokalemia to have resolved with sodium 141, potassium 4.2,  chloride  102, CO2 of 32, BUN 16, creatinine 0.9, and glucose 89.  CBC  revealed hemoglobin 12.3, hematocrit 36.6, white count 9.4, and  platelets 256.  The patient was maintained on subcu heparin for DVT  prophylaxis during this stay.  Rehab RN has been assisting with the  patient's care by emphasizing pressure relief measures as well as  working on safety and encouraging p.o. intake to prevent issues with  dehydration.   At the time of admission, the patient was noted to be limited by  generalized weakness, decrease in standing balance, and was unable to  take any steps at all with cane.  She was able to walk short distances  with a rolling walker, approximately 20 feet, however, limited by lower  extremity weakness.  She was also noted to have dyspnea 2/4 limiting her  activity and self care.  Self-care training has focused at sink level  per the patient's choice.  She was noted to be easily distracted  initially with multi tasks for bathing and dressing.  The patient  progressed along to being modified independent level for bathing and  dressing.  She was modified independent for toileting.  She was able to  shower and perform shower transfers at supervision.  Physical therapy  evaluation revealed the patient was unwilling to take any steps with  cane.  She was able to ambulate with a rolling walker for 20 feet with  rest break due to generalized lower extremity weakness, was able to walk  a total of 40 feet.  PT and OT has been working with the patient on  lower extremity exercise group for strengthening, endurance as well as  range of motion.  They have also been working with tasks to help improve  the patient's divided attention during mobility.  By the time of  discharge, the patient was modified independent to navigate a curve.  She required supervision to navigate 8 stairs with both rails.  She was  modified independent for bed mobility, supervision for car transfers,  and able to  ambulate 60 feet at modified independent level.  She was  able to do community gait in gift shop at 50 feet with supervision.  Family education was done with the patient's daughters to include home  arrangements, to decrease fall risk as well as other after home safety  recommendation.  They have also educated regarding guarding the patient  in community settings as well as for navigation of stairs.  The  patient  will continue to receive further follow up home health, PT, OT by  advanced home care.  On May 31, 2009, the patient is discharged to  home.   DISCHARGED MEDICATIONS:  1. Calcium plus D b.i.d.  2. Amlodipine 2.5 mg a day.  3. Coated aspirin 81 mg a day.  4. Crestor 5 mg a day.  5. Lopressor 25 mg half p.o. b.i.d.   DIET:  Regular.   ACTIVITY LEVEL:  At intermittent supervision.  No strenuous activity.  No alcohol.  No smoking.  No driving.  Walk with walker.   FOLLOWUP:  The patient to follow up with Dr. Riley Kill as needed.  Follow  up with Dr. Renne Crigler in 2 weeks for routine check.      Greg Cutter, P.A.      Ranelle Oyster, M.D.  Electronically Signed    PP/MEDQ  D:  05/31/2009  T:  06/01/2009  Job:  [id number]   cc:   Soyla Murphy. Renne Crigler, M.D.

## 2011-03-25 NOTE — Discharge Summary (Signed)
NAMEFRIMY, UFFELMAN                  ACCOUNT NO.:  [id number]   MEDICAL RECORD NO.:  [id number]          PATIENT TYPE:  INP   LOCATION:  1401                         FACILITY:  [facility]   PHYSICIAN:  Marcellus Scott, MD     DATE OF BIRTH:  07/01/1919   DATE OF ADMISSION:  05/19/2009  DATE OF DISCHARGE:  05/25/2009                               DISCHARGE SUMMARY   PRIMARY MEDICAL DOCTOR:  Dr. Merri Brunette.   DISCHARGE DIAGNOSES:  1. Escherichia coli urinary tract infection.  2. Multifactorial dyspnea.  3. Vitamin D deficiency.  4. Chronic T11 compression fracture with back pain.  5. Hypertension.  6. Leukocytosis.  7. Anemia.  8. Deconditioning, weakness, status post fall.  9. Nonsustained ventricular tachycardia with normal left ventricular      ejection fraction on echo.   DISCHARGE MEDICATIONS:  1. Protonix 40 mg p.o. daily.  2. Senokot-S 1 p.o. nightly.  Hold for diarrhea.  3. Enteric-coated aspirin 81 mg p.o. daily.  4. Crestor 5 mg p.o. nightly.  5. Amlodipine 10 mg p.o. daily.  6. Ciprofloxacin 500 mg p.o. b.i.d. through May 25, 2009, then      discontinue.  7. Metoprolol 12.5 mg p.o. b.i.d.  8. Ergocalciferol 50,000 units p.o. weekly for 8 weeks.  9. Os-Cal 500 plus vitamin D, 1 p.o. b.i.d.  10.Ambien 5 mg p.o. nightly p.r.n.  11.Tylenol 650 mg p.o. q.4 h., p.r.n.  12.Albuterol metered drug inhaler 90 mcg per spray, 2 puffs inhaled      q.4-6 hourly p.r.n.  13.Atrovent HFA 17 mcg per spray MDI, 2 puffs inhaled q.i.d. p.r.n.   PROCEDURES:  1. Chest x-ray on May 23, 2009, impression;      a.     Haziness at the lung base.  This most likely reflects       atelectasis and possible small effusions.      b.     Stable cardiomegaly and moderately large hiatal hernia.  2. CT angiogram of the chest on May 20, 2009, impression;      a.     No pulmonary thromboembolism.      b.     Hiatal hernia.      c.     Small bilateral pleural effusions.      d.     T11 compression  deformity.  3. MRI of the head without contrast, impression; No acute intracranial      abnormality.  Atrophy and mild chronic microvascular ischemic      change.  4. MRA of the head, impression; Atherosclerotic disease in the      posterior cerebral arteries bilaterally and in the right middle      cerebral artery.  No large vessel occlusion.  5. MRI of the thoracic spine, impression;  Chronic fracture of T11.      No acute fracture.  Negative for spinal stenosis or cord      compression.  6. Chest x-ray on May 18, 2009, impression;      a.     Approximately 50% thoracic vertebral compression deformity,  age indeterminate.      b.     Mild bibasilar atelectasis or scarring.      c.     Mild changes of COPD.      d.     Moderate to large sized hiatal hernia.  7. CT of the head without contrast, impression; mild atrophy and mild      chronic small vessel white matter ischemic change.  No acute      abnormality.  8. 2-D echocardiogram, impression;      a.     Left ventricle cavity size was normal.  There was mild focal       basal hypertrophy of the septum.  Systolic function was normal.       Wall motion was normal.  There were no regional wall motion       abnormalities.  There was an increased relative contribution of       atrial contraction to ventricular filling.      b.     Mild-to-moderate aortic valve regurgitation.      c.     Mild mitral valve regurgitation.  9. Carotid Dopplers.  Summary:  40-60% ICA stenosis, possibly due to      vessel tortuosity.  Mild ECA stenosis noted.      a.     Left, no evidence of significant ICA stenosis.  Mild ECA       stenosis noted.  Vertebral artery flow is antegrade.   PERTINENT LABS:  Vitamin D 25-hydroxy 15.  BNP 36, magnesium 2.2.  Cardiac panel cycled and not suggestive of acute coronary syndrome.  Basic metabolic panel within normal limits with BUN 16, creatinine 0.96.  Urine culture, E-coli which was pansensitive, except to  ampicillin.  CBCs with hemoglobin 12.2, hematocrit 36.8, white blood cell 7.3,  platelets 195.  C-reactive protein 0.1, D-dimer 0.51.  Lipid panel  remarkable for LDL of 124.  RPR nonreactive.  Homocystine 11.8, free T4  0.86, TSH 1.353, ESR 19.  Hepatic panel only remarkable for low protein  of 5.9 and albumin of 3.  Urinalysis with 3-6 white blood cells and rare  bacteria.   CONSULTATIONS:  Comprehensive inpatient rehabilitation.   HOSPITAL COURSE AND PATIENT DISPOSITION:  Savannah [name] is a pleasant 77-  year-old Caucasian female patient who lives at home and was independent  of activities of daily living, although she used a cane to walk.  She is  status post remote hip fracture, who recently obtained a walker.  She  has history of hypertension, chronic anemia, anxiety.  She now presented  with inability to walk and possible fall.  There was no loss of  consciousness.  She was thereby admitted to the hospital for evaluation  of cerebrovascular accident.   PROBLEMS:  1. E-coli urinary tract infection.  Further evaluation as to the cause      of her weakness and inability to walk revealed E-coli UTI.  She was      started on ciprofloxacin and will complete a week course of      antibiotics on May 25, 2009.  She has remained afebrile and does      not have leukocytosis or any urinary symptoms at this time.  2. Dyspnea.  During the hospitalization, the patient has progressively      done well.  She has some dyspnea on exertion with physical therapy.      The workup for this is not revealing for pulmonary embolism or  congestive heart failure.  This is probably multifactorial from      some degree of COPD given her history of smoking in the past,      deconditioning, bibasal atelectasis and tiny effusions.  This      should improve with strengthening.  The patient was hypoxic at one      point.  She has been able to come off the oxygen and saturating 94%      on room air.  Recommend  repeating CXR in acouple of weeks.  3. Vitamin D deficiency.  As a part of workup for her T11 compression      fracture, vitamin D levels were done which are suggestive of      deficiency.  We have started her on vitamin D supplements.      Recommend repeating her vitamin D levels in 8 weeks and tailor      medications to the levels.  She is to continue calcium supplements.  4. Hypertension.  Her blood pressures currently range in the 150s/70s      predominantly.  Consider adjusting medications as an outpatient.  5. Nonsustained ventricular tachycardia.  The patient had one episode      of 9 beat nonsustained ventricular tachycardia approximately 3 days      ago.  This was asymptomatic.  Workup for this has shown normal left      ventricular ejection fraction.  She was started on low-dose beta-      blockers and the patient has not had any further events.  6. Anxiety disorder.  The patient has not been on any medications at      home prior to admission.  She does not demonstrate overactive      anxiety.  She in fact tells [place] that she had stopped taking Effexor      along back and was told not to take it.   The patient was seen in consult by the inpatient rehab MD, and the  patient was deemed appropriate for transfer to the rehab facility.  However, there were no beds available until this morning when we were  told there is a bed available at the [facility] inpatient rehab  facility.  The patient at this time is stable for discharge and transfer  to rehab.   DISCHARGE MEDICATIONS:  Time taken in coordinating this discharge was 35  minutes.      Marcellus Scott, MD  Electronically Signed     AH/MEDQ  D:  05/25/2009  T:  05/25/2009  Job:  [id number]   cc:   Soyla Murphy. Renne Crigler, M.D.  Fax: [phone]   Erick Colace, M.D.  Fax: [phone]

## 2011-03-25 NOTE — Consult Note (Signed)
NAMEJASKIRAN, Savannah [name]                  ACCOUNT NO.:  [id number]   MEDICAL RECORD NO.:  [id number]          PATIENT TYPE:  INP   LOCATION:  4742                         FACILITY:  [facility]   PHYSICIAN:  Graylin Shiver, M.D.   DATE OF BIRTH:  [birth year]   DATE OF CONSULTATION:  12/30/2007  DATE OF DISCHARGE:                                 CONSULTATION   REASON FOR CONSULTATION:  We were asked to see Savannah [name] in consultation  for rectal bleed by [facility].   HISTORY OF PRESENT ILLNESS:  This is a very pleasant 77 year old female  who had a single episode of a large amount of bright red blood per  rectum last night in the middle of the night.  She felt dizzy  afterwards.  She denies any heartburn, indigestion, anorexia,  constipation or change in bowel habits as well as any abdominal pain.  She tells me she is hungry.  She is a patient of Dr. Dorena Cookey of [facility] as well as Dr. Arlan Organ.  She sees Dr. Myna Hidalgo for chronic  anemia.  According to her daughters, she has been using BC powders since  January for headaches and has taken approximately 24 packets since the  beginning of January of this year.   PAST MEDICAL HISTORY:  Significant for severe right and left  diverticulosis on colonoscopy in May 2007.  She does have a history of  adenomatous colon polyps, hypertension, depression, osteoporosis, iron  deficiency anemia and pelvic fracture.  She also had an upper endoscopy  with biopsy in July 2006 that demonstrated a large hiatal hernia.   PAST SURGICAL HISTORY:  Including appendectomy, hysterectomy and  tonsillectomy.   SOCIAL HISTORY:  She lives alone, but has 3 attentive children.  She  denies any drug, alcohol or tobacco use.   FAMILY HISTORY:  Noncontributory.   REVIEW OF SYSTEMS:  Negative for fever, shortness of breath,  palpitations or dizziness prior to her bleed.   CURRENT MEDICATIONS:  Unknown by the patient.   LABORATORY DATA:  Coags are within normal  limits.  Hemoglobin is  currently 11.2, hematocrit 33.5, white count 8.3.  BMET is normal with a  BUN of 21.  Her LFTs are normal.  She was guaiac positive in the ER.   PHYSICAL EXAMINATION:  GENERAL:  She is alert and oriented in no  apparent distress.  She is very pleasant to speak with.  Her 2 daughters  are at bedside.  VITAL SIGNS:  Pulse is 75, blood pressure is 145/69.  CARDIOVASCULAR:  Difficult to hear, but sounds regular.  ABDOMEN:  Soft, nontender, nondistended with normal bowel sounds.   ASSESSMENT:  Dr. Wandalee Ferdinand has seen and examined the patient.  His  impression is that this is likely a diverticular bleed in a patient with  known severe diverticulosis and chronic anemia.   PLAN:  We will place on clear liquid diet for now.  Monitor closely.  Check stools for continued bleeding.  We will check H&H q.8 h.  No  immediate need for colonoscopy seen.  Thanks very much for this consultation.      Stephani Police, PA    ______________________________  Graylin Shiver, M.D.    MLY/MEDQ  D:  12/30/2007  T:  12/31/2007  Job:  [id number]   cc:   Everardo All. Madilyn Fireman, M.D.  Rose Phi. Myna Hidalgo, M.D.  Graylin Shiver, M.D.

## 2011-03-25 NOTE — H&P (Signed)
Savannah [name], SOSA NO.:  [id number]   MEDICAL RECORD NO.:  [id number]          PATIENT TYPE:  EMS   LOCATION:  ED                           FACILITY:  [facility]   PHYSICIAN:  Vania Rea, M.D. DATE OF BIRTH:  05/30/1919   DATE OF ADMISSION:  05/18/2009  DATE OF DISCHARGE:                              HISTORY & PHYSICAL   PRIMARY CARE PHYSICIAN:  Dr. Merri Brunette.   CHIEF COMPLAINT:  Inability to walk.   HISTORY OF PRESENT ILLNESS:  This is an 77 year old Caucasian lady who  lives at home, usually is independent with activities of daily living,  although she does use a cane to walk with, status post a remote hip  fracture and was recently prescribed a walker, although she has not  gotten it yet, as noted recently to be more nervous about walking, and  then today while on the phone suddenly complained of inability to walk  and was found about 1 hour later with the phone off the hook, down on  her knees, unable to get up.  Her knees were noted to be very sore  because she had been kneeling on them for so long, and she had a large  hematoma on her right forearm.  There is no history to suggest head  trauma nor loss of consciousness.  She denies any chest pain, shortness  of breath or palpitations.  She has been having headaches since arrival  in the emergency room.  Denies any bladder or bowel problems.  Denies  any numbness, tingling or burning in the lower extremities.   PAST MEDICAL HISTORY:  1. Hypertension.  2. Chronic anemia.  3. Anxiety.  4. Remote history of pubic ramus fracture.   PAST SURGICAL HISTORY:  1. Appendectomy.  2. Hysterectomy.  3. Tonsillectomy.   MEDICATIONS:  Crestor 5 mg daily, amlodipine 2.5 mg daily, Actonel  monthly. and calcium with vitamin D twice daily.   ALLERGIES:  NO KNOWN DRUG ALLERGIES.   SOCIAL HISTORY:  Lives alone.  Her children live next door.  Denies  tobacco, alcohol or illicit drug use.  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   FAMILY HISTORY:  Significant for cardiovascular disease and strokes in  siblings and her father.  She has one sister with diabetes.   REVIEW OF SYSTEMS:  On a 10-point review of systems, other than noted  above was unremarkable.   PHYSICAL EXAMINATION:  Well-nourished elderly Caucasian lady lying in  bed, somewhat anxious but not distressed.  She is smiling pleasantly  during the conversation.  VITALS:  Her temperature is 98.0.  Pulse 87, respiration 18, blood  pressure 191/99.  She is saturating at 92% on room air.  Pupils are  round and equal.  Mucous membranes pink and anicteric.  No cervical lymphadenopathy or thyromegaly.  No jugular venous  distention.  No carotid bruit.  Her chest is clear to auscultation bilaterally.  CARDIOVASCULAR:  Regular rhythm.  ABDOMEN:  Obese but soft.  There is no tenderness.  No masses felt.  EXTREMITIES:  She has a  trace edema bilaterally.  Dorsalis pedis pulses  are 1+ bilaterally.  Arthritic changes in both knees.  SKIN:  She has tender bruising just below both knees, and she has large  hematoma on the mid forearm about 5 inches in diameter; however, the  skin is warm, and there is no ulcerations.  CENTRAL NERVOUS SYSTEM:  Cranial nerves II-XII are grossly intact,  although she does seem to be mildly hearing-impaired.  She has grade 5  power in both upper extremities, and there is no sensory impairment in  the lower extremities.  No sensory impairment was noted.  She is able to  move her lower legs against resistance, but she is noticeably weak.  Her  Babinski's are upgoing bilaterally, and the deep tendon reflexes on the  lower extremities were otherwise not tested because of the tenderness of  her knee.  Her gait:  The patient was able to stand but was very nervous  about it and kept insisting that she want to sit down.  She was able to  take a few steps but seemed again very nervous about walking.  She was   assisted back to bed.  She had no lateralizing signs while attempting to  walk.   LABS:  White count is 12.5, hemoglobin 13.8, platelets 246.  She has 84%  neutrophils.  Absolute granulocyte count was 10.5.  Differential was  otherwise normal.  Her chemistry was significant for sodium of 142,  potassium 4.1, chloride 106, CO2 30, glucose 95, BUN elevated to 99,  creatinine 1.03.  Her calcium was 9.3.  Her cardiac enzymes:  Myoglobin  was greater than 500, CK-MB 2.8.  Troponins undetectable.  Urinalysis:  Specific gravity is 1.016 with trace of ketones, trace of blood,  negative for protein, is positive for nitrites, moderate leukocyte  esterase with microscopy revealed 3 to 6 white cells and rare bacteria.   Chest x-ray shows 50% lower thoracic vertebral compression deformity,  age indeterminate, and mild bibasilar atelectasis or scarring, mild COPD  changes moderate-to-large hiatal hernia.  No acute abnormalities  detected.   CT scan of the brain showed patchy white matter, low density in both  cerebral hemispheres, small left basal ganglia, lacunar infarct or  prominent perivascular space, no fractures.  No CT evidence of acute  stroke.   ASSESSMENT:  1. Acute inability to walk.  Questionable anxiety, questionable acute      cerebrovascular accident, question natural progression of age-      related debility.  2. History of anxiety disorder.  3. History of anemia.  4. Hypertension uncontrolled.  5. Dehydration as evidenced by the elevated BUN/creatinine ratio.  6. Probable urinary tract infection, as evidenced by the abnormal      urinalysis.   PLAN:  Will hydrate this lady gently and will lower her blood pressure  somewhat but not to normal, in case she has had an acute stroke.  Will  order an MRI of the brain in the morning and will get neurology and  physical therapy consult after MRI.  Other plans as per orders.      Vania Rea, M.D.  Electronically  Signed     LC/MEDQ  D:  05/19/2009  T:  05/19/2009  Job:  [id number]   cc:   Rose Phi. Myna Hidalgo, M.D.  Fax: [phone]   Soyla Murphy. Renne Crigler, M.D.  Fax: [phone]   Annye Rusk, MD

## 2011-03-25 NOTE — Discharge Summary (Signed)
NAMEWEDNESDAY, ERICSSON                  ACCOUNT NO.:  [id number]   MEDICAL RECORD NO.:  [id number]          PATIENT TYPE:  INP   LOCATION:  1401                         FACILITY:  [facility]   PHYSICIAN:  Hind I Elsaid, MD      DATE OF BIRTH:  1919/04/26   DATE OF ADMISSION:  05/18/2009  DATE OF DISCHARGE:                               DISCHARGE SUMMARY   PRIMARY CARE PHYSICIAN:  Dr. Merri Brunette.   DISCHARGE DIAGNOSES:  1. E. coli urinary tract infection.  2. Multifactorial shortness of breath secondary to obesity, hiatal      hernia, and anxiety.  3. History of hypertension.  4. History of diverticular gastrointestinal bleeding.  5. History of depression.  6. Osteoporosis.  7. Nonsustained V-tach felt to be secondary to hypokalemia.  8. Weakness/deconditioning.  9. Chronic T12 compression fracture.   MEDICATIONS:  1. Crestor 5 mg p.o. daily.  2. Norvasc 2.5 mg daily.  3. Calcium and vitamin D 1 tab twice daily.  4. Cipro 250 mg p.o. b.i.d. for 3 days.  5. Metoprolol 12.5 mg twice daily.  6. Combivent inhaler every 4 hours p.r.n.   PROCEDURE:  1. CT head without contrast, mild atrophy, mild chronic small vessel      disease.  2. Chest x-ray, approximately 50% lower thoracic vertebral compression      deformity, mild basilar atelectasis, mild change of COPD, moderate      to large-sized hiatal hernia.  3. MRI of the brain, atherosclerotic disease in the posterior cerebral      artery bilaterally and in the right middle cerebral artery.  4. MRA, chronic fracture of the T11, no acute fracture.  5. CT angio, no pulmonary embolism, hiatal hernia, small bilateral      pleural effusion, T11 comprehensive deformity, chronic.   HISTORY OF PRESENT ILLNESS:  1. This is an 77 year old Caucasian female, lives at home, usually is      independent with activities of daily living.  Also, she does use a      cane to walk, status post remote hip fracture, was recently      prescribed a walker.   She complained of an inability to walk and      was found about 1 hour later, was found on her knees, unable to get      up.  Noticed her knees were sore secondary to the position.  She      had a large hematoma on her right forearm.  Denies any chest pain      or shortness of breath.  There is acute inability to walk.  The      question on anxiety versus acute CVA versus deconditioning.      Patient had CT head of the brain which was negative for acute      infarct.  MRI of the brain negative for acute infarct and MRA of      the head no acute intracranial abnormalities.  Had also carotid      duplex but did not show any significant carotid artery stenosis.  X-      ray of her thoracic spine did show chronic deformity of chronic      compression of the thoracic spine.  Patient evaluate by PT and OT      and recommend inpatient rehab.  At this point, patient is waiting      for inpatient rehab.  2. E. coli UTI.  Patient to continue Cipro 250 mg p.o. b.i.d. for      extra 3 days.  3. Shortness of breath which is multifactorial, could be secondary to      mild COPD, large hiatal hernia, obesity, mainly truncal obesity and      for that patient will be discharged with Combivent and the plan for      this patient to wean oxygen and if the patient requires any kind of      home oxygen.  4. Nonsustained V-tach.  Patient noticed to have a run of V-tach on      the day of admission.  For that, 2D echo was done which showed left      ventricular cavity within normal and systolic function was in      normal.  There is no motion wall abnormality and there was      increased rate of contribution of the __________contraction to      ventricular filling.  Her potassium level was low.  Patient      replaced.  No further episode.  Patient was started on metoprolol      12.5 mg p.o. b.i.d.  5. History of hypertension, remained stable.   Further discharge medications will be added at the date of  actual  discharge.  Patient waiting for bed at the inpatient rehab.      Hind Bosie Helper, MD  Electronically Signed     HIE/MEDQ  D:  05/23/2009  T:  05/23/2009  Job:  [id number]

## 2011-03-28 NOTE — Procedures (Signed)
[facility]  Patient:    Savannah [name], Savannah [name] Visit Number: [id number] MRN: [id number]          Service Type: END Location: ENDO Attending Physician:  Louie Bun Dictated by:   Everardo All Madilyn Fireman, M.D. Proc. Date: 03/23/02 Admit Date:  03/23/2002   CC:         Soyla Murphy. Renne Crigler, M.D.   Procedure Report  PROCEDURE:  Colonoscopy.  INDICATION FOR PROCEDURE:  History of adenomatous colon polyps.  DESCRIPTION OF PROCEDURE:  The patient was placed in the left lateral decubitus position and placed on the pulse monitor with continuous low flow oxygen delivered by nasal cannula. She was sedated with 50 mg IV Demerol and 5 1/2 mg IV Versed. The Olympus video colonoscope was inserted into the rectum and advanced to the cecum, confirmed by transillumination at McBurneys point and visualization of the ileocecal valve and appendiceal orifice. The prep was excellent. The cecum, ascending, and transverse colon appeared normal with no masses, polyps, diverticula or other mucosal abnormalities. Within the descending and sigmoid colon, there were seen numerous diverticula, no other abnormalities. The rectum appeared normal and retroflexed view of the anus revealed no obviously enlarged internal hemorrhoids. The colonoscope was then withdrawn and the patient returned to the recovery room in stable condition. The patient tolerated the procedure well and there were no immediate complications.  IMPRESSION:  Diverticulosis, otherwise, normal colonoscopy.  PLAN:  Repeat colonoscopy in five years. Dictated by:   Everardo All Madilyn Fireman, M.D. Attending Physician:  Louie Bun DD:  03/23/02 TD:  03/24/02 Job: [id number]

## 2011-03-28 NOTE — Op Note (Signed)
NAMEGLENYCE, RANDLE                  ACCOUNT NO.:  [id number]   MEDICAL RECORD NO.:  [id number]          PATIENT TYPE:  AMB   LOCATION:  ENDO                         FACILITY:  [facility]   PHYSICIAN:  John C. Madilyn Fireman, M.D.    DATE OF BIRTH:  1919/07/24   DATE OF PROCEDURE:  03/18/2006  DATE OF DISCHARGE:                                 OPERATIVE REPORT   PROCEDURE:  Colonoscopy.   INDICATION FOR PROCEDURE:  Heme-positive stool and anemia as well as a  history of adenomatous colon polyps at the last colonoscopy in 2003.   PROCEDURE:  The patient was placed in the left lateral decubitus position  and placed on the pulse monitor with continuous low-flow oxygen delivered by  nasal cannula.  She was sedated with 37.5 mcg IV fentanyl and 3 mg IV  Versed.  The Olympus video colonoscope was inserted into the rectum and  advanced to the cecum, confirmed by transillumination of McBurney's point  and visualization of the ileocecal valve and appendiceal orifice.  The prep  was excellent.  The cecum appeared normal with no masses, polyps,  diverticula or other mucosal abnormalities.  In the ascending, transverse,  descending and sigmoid colon were numerous large and small diverticula with  no polyps, masses or mucosal abnormalities seen.  The rectum appeared  normal, and retroflexed view of the anus revealed no obvious internal  hemorrhoids.  The scope was then withdrawn and the patient returned to the  recovery room in stable condition.  She tolerated the procedure well, and  there were no immediate complications.   IMPRESSION:  Severe right and left diverticulosis, otherwise normal study.   PLAN:  Monitor stools and hemoglobin for further signs of any GI bleeding.  Will repeat Hemoccults in four weeks.           ______________________________  Everardo All Madilyn Fireman, M.D.     [facility]/MEDQ  D:  03/18/2006  T:  03/19/2006  Job:  [id number]   cc:   Soyla Murphy. Renne Crigler, M.D.  Fax: [phone]   Rose Phi. Myna Hidalgo,  M.D.  Fax: [phone]

## 2011-08-01 LAB — BASIC METABOLIC PANEL
CO2: 29
CO2: 29
CO2: 32
Calcium: 8.4
Chloride: 103
Chloride: 103
Chloride: 108
Creatinine, Ser: 0.81
GFR calc Af Amer: >60
Glucose, Bld: 107 — ABNORMAL HIGH
Glucose, Bld: 156 — ABNORMAL HIGH
Potassium: 3.1 — ABNORMAL LOW
Potassium: 3.8
Sodium: 141
Sodium: 142

## 2011-08-01 LAB — CBC
HCT: 28.7 — ABNORMAL LOW
HCT: 36.8
HCT: 36.9
Hemoglobin: 10 — ABNORMAL LOW
Hemoglobin: 11.2 — ABNORMAL LOW
Hemoglobin: 12.3
Hemoglobin: 12.8
Hemoglobin: 12.9
Hemoglobin: 9.5 — ABNORMAL LOW
MCHC: 33.1
MCHC: 33.2
MCHC: 33.4
MCHC: 33.5
MCHC: 33.5
MCV: 84.3
MCV: 86.2
MCV: 86.4
Platelets: 221
Platelets: 227
RBC: 3.23 — ABNORMAL LOW
RBC: 3.38 — ABNORMAL LOW
RBC: 4.27
RBC: 4.4
RBC: 4.48
RDW: 14.7
RDW: 14.8
RDW: 14.8
RDW: 14.9
WBC: 5.3
WBC: 5.8
WBC: 6.3
WBC: 7.1
WBC: 7.1

## 2011-08-01 LAB — CROSSMATCH: Antibody Screen: NEGATIVE

## 2011-08-01 LAB — CARDIAC PANEL(CRET KIN+CKTOT+MB+TROPI)
CK, MB: 1.2
Relative Index: INVALID
Relative Index: INVALID
Total CK: 30
Total CK: 32
Total CK: 33

## 2011-08-01 LAB — TROPONIN I: Troponin I: 0.03

## 2011-08-01 LAB — COMPREHENSIVE METABOLIC PANEL
AST: 14
CO2: 31
Chloride: 106
Creatinine, Ser: 0.81
GFR calc Af Amer: >60
GFR calc non Af Amer: >60
Total Bilirubin: 0.8

## 2011-08-01 LAB — DIFFERENTIAL
Basophils Absolute: 0
Basophils Relative: 1
Eosinophils Absolute: 0
Monocytes Absolute: 0.6
Neutro Abs: 6.6
Neutrophils Relative %: 79 — ABNORMAL HIGH

## 2011-08-01 LAB — SALICYLATE LEVEL: Salicylate Lvl: <4

## 2011-08-01 LAB — PROTIME-INR
INR: 0.9
Prothrombin Time: 12.7

## 2011-08-01 LAB — ABO/RH: ABO/RH(D): A POS

## 2011-08-01 LAB — APTT: aPTT: 30

## 2011-08-01 LAB — HEPATIC FUNCTION PANEL
AST: 15
Bilirubin, Direct: 0.1
Total Bilirubin: 0.5

## 2011-08-01 LAB — CK TOTAL AND CKMB (NOT AT ARMC): CK, MB: 1.7

## 2011-08-01 LAB — SAMPLE TO BLOOD BANK

## 2012-12-25 ENCOUNTER — Inpatient Hospital Stay (HOSPITAL_COMMUNITY)
Admission: EM | Admit: 2012-12-25 | Discharge: 2012-12-28 | DRG: 536 | Disposition: A | Discharge status: Skilled Nursing Facility | Payer: Medicare Other | Attending: Internal Medicine | Admitting: Internal Medicine

## 2012-12-25 ENCOUNTER — Encounter (HOSPITAL_COMMUNITY): Payer: Self-pay

## 2012-12-25 ENCOUNTER — Emergency Department (HOSPITAL_COMMUNITY): Payer: Medicare Other

## 2012-12-25 DIAGNOSIS — Z87442 Personal history of urinary calculi: Secondary | ICD-10-CM

## 2012-12-25 DIAGNOSIS — Z79899 Other long term (current) drug therapy: Secondary | ICD-10-CM

## 2012-12-25 DIAGNOSIS — Z66 Do not resuscitate: Secondary | ICD-10-CM | POA: Diagnosis present

## 2012-12-25 DIAGNOSIS — I10 Essential (primary) hypertension: Secondary | ICD-10-CM | POA: Diagnosis present

## 2012-12-25 DIAGNOSIS — R7309 Other abnormal glucose: Secondary | ICD-10-CM | POA: Diagnosis present

## 2012-12-25 DIAGNOSIS — W19XXXA Unspecified fall, initial encounter: Secondary | ICD-10-CM | POA: Diagnosis present

## 2012-12-25 DIAGNOSIS — R739 Hyperglycemia, unspecified: Secondary | ICD-10-CM | POA: Diagnosis present

## 2012-12-25 DIAGNOSIS — S32509A Unspecified fracture of unspecified pubis, initial encounter for closed fracture: Principal | ICD-10-CM | POA: Diagnosis present

## 2012-12-25 DIAGNOSIS — Z87891 Personal history of nicotine dependence: Secondary | ICD-10-CM

## 2012-12-25 DIAGNOSIS — Y92009 Unspecified place in unspecified non-institutional (private) residence as the place of occurrence of the external cause: Secondary | ICD-10-CM

## 2012-12-25 DIAGNOSIS — Z7982 Long term (current) use of aspirin: Secondary | ICD-10-CM

## 2012-12-25 DIAGNOSIS — E7800 Pure hypercholesterolemia, unspecified: Secondary | ICD-10-CM | POA: Diagnosis present

## 2012-12-25 HISTORY — DX: Pure hypercholesterolemia, unspecified: E78.00

## 2012-12-25 HISTORY — DX: Essential (primary) hypertension: I10

## 2012-12-25 HISTORY — DX: Calculus of kidney: N20.0

## 2012-12-25 HISTORY — DX: Fracture of unspecified parts of lumbosacral spine and pelvis, initial encounter for closed fracture: S32.9XXA

## 2012-12-25 LAB — CBC WITH DIFFERENTIAL/PLATELET
Basophils Absolute: 0 10*3/uL (ref 0.0–0.1)
Eosinophils Absolute: 0 10*3/uL (ref 0.0–0.7)
Eosinophils Relative: 0 % (ref 0–5)
MCH: 25.5 pg — ABNORMAL LOW (ref 26.0–34.0)
MCV: 81.2 fL (ref 78.0–100.0)
Monocytes Absolute: 0.7 10*3/uL (ref 0.1–1.0)
Platelets: 184 10*3/uL (ref 150–400)
RDW: 14.9 % (ref 11.5–15.5)

## 2012-12-25 NOTE — ED Provider Notes (Signed)
History    CSN: [id number]  Arrival date & time 12/25/12  [name]   First MD Initiated Contact with Patient 12/25/12 1938      Chief Complaint  Patient presents with  . Fall    HPI Savannah [name] is a 79 y.o. female who was brought in to the ED after a ground level fall.  Patient lives at home alone and apparently tripped and fell while using her walker.  Pants had reportedly slipped to her ankles.  Patient had cell phone in walker and called daughter who lived next door.  Daughter arrived and patient on the floor.  Had L hip pain.  No spine pain.  No headaches.  No chest/abdominal pain.  No other symptoms.  Past Medical History  Diagnosis Date  . Hypertension   . Hypercholesteremia   . Pelvis fracture   . Kidney stones     Past Surgical History  Procedure Laterality Date  . Abdominal hysterectomy      Family History  Problem Relation Age of Onset  . Heart disease Mother   . Heart disease Father   . Diabetes Sister   . Stroke Brother     History  Substance Use Topics  . Smoking status: Former Games developer  . Smokeless tobacco: Not on file  . Alcohol Use: No    OB History   Grav Para Term Preterm Abortions TAB SAB Ect Mult Living                  Review of Systems  Constitutional: Negative for fever and chills.  HENT: Negative for congestion, rhinorrhea, neck pain and neck stiffness.   Respiratory: Negative for cough and shortness of breath.   Cardiovascular: Negative for chest pain.  Gastrointestinal: Negative for nausea, vomiting, abdominal pain, diarrhea and abdominal distention.  Endocrine: Negative for polyuria.  Genitourinary: Negative for dysuria.  Skin: Negative for rash.  Neurological: Negative for headaches.  Psychiatric/Behavioral: Negative.   All other systems reviewed and are negative.    Allergies  Review of patient's allergies indicates no known allergies.  Home Medications   Current Outpatient Rx  Name  Route  Sig  Dispense  Refill  .  amLODipine (NORVASC) 2.5 MG tablet   Oral   Take 2.5 mg by mouth daily.         Marland Kitchen aspirin EC 81 MG tablet   Oral   Take 81 mg by mouth daily.         . calcium-vitamin D (OSCAL WITH D) 500-200 MG-UNIT per tablet   Oral   Take 1 tablet by mouth daily.         . Cholecalciferol (VITAMIN D-3) 1000 UNITS CAPS   Oral   Take 1,000 Units by mouth daily.         . metoprolol tartrate (LOPRESSOR) 25 MG tablet   Oral   Take 12.5 mg by mouth 2 (two) times daily.         . risedronate (ACTONEL) 150 MG tablet   Oral   Take 150 mg by mouth every 30 (thirty) days. with water on empty stomach, nothing by mouth or lie down for next 30 minutes. Take the 1st of every month         . rosuvastatin (CRESTOR) 20 MG tablet   Oral   Take 20 mg by mouth at bedtime.           BP 145/64  Pulse 56  Temp(Src) 97.6 F (36.4 C) (Oral)  Resp 16  SpO2 100%  Physical Exam  Nursing note and vitals reviewed. Constitutional: She is oriented to person, place, and time. She appears well-developed and well-nourished. No distress.  HENT:  Head: Normocephalic and atraumatic.  Right Ear: External ear normal.  Left Ear: External ear normal.  Nose: Nose normal.  Mouth/Throat: Oropharynx is clear and moist. No oropharyngeal exudate.  Eyes: EOM are normal. Pupils are equal, round, and reactive to light.  Neck: Normal range of motion. Neck supple. No tracheal deviation present.  Cardiovascular: Normal rate.   Pulmonary/Chest: Effort normal and breath sounds normal. No stridor. No respiratory distress. She has no wheezes. She has no rales.  Abdominal: Soft. She exhibits no distension. There is no tenderness. There is no rebound.  Musculoskeletal: Normal range of motion.       Cervical back: She exhibits no bony tenderness.       Thoracic back: She exhibits no bony tenderness.       Lumbar back: She exhibits no bony tenderness.  LLE: TTP over L hip.  No deformity.  No shortening.  NVI.     Neurological: She is alert and oriented to person, place, and time.  Skin: Skin is warm and dry. She is not diaphoretic.    ED Course  Procedures (including critical care time)  Labs Reviewed  CBC WITH DIFFERENTIAL  BASIC METABOLIC PANEL  URINALYSIS, ROUTINE W REFLEX MICROSCOPIC   Dg Lumbar Spine 2-3 Views  12/25/2012  *RADIOLOGY REPORT*  Clinical Data: Status post fall  LUMBAR SPINE - 2-3 VIEW  Comparison: None.  Findings:  There is a compression deformity involving the T11 vertebra, which is unchanged from 05/20/2009.  There are no new fracture deformities identified.  There is a first-degree anterolisthesis of L4 on L5 measuring 7 mm. Multilevel disc space narrowing and ventral spurring is noted.  Degenerative disc disease is most severe at the L5 S1 level.  Calcified atherosclerotic disease affects the abdominal aorta and its branches.  IMPRESSION:  1.  Lumbar spondylosis. 2.  Stable T11 compression fracture.   Original Report Authenticated By: Signa Kell, M.D.    Dg Pelvis 1-2 Views  12/25/2012  *RADIOLOGY REPORT*  Clinical Data: Status post fall  PELVIS - 1-2 VIEW  Comparison: 06/20/2005  Findings: Bones are diffusely osteopenic and there is bowel gas overlying the sacrum.There are chronic fracture deformities involving the superior and inferior right pubic rami. Cortical irregularity involving the left pubic rami noted which is suspicious for fracture in this area.  Both hips appear located. No evidence for hip fracture.  IMPRESSION:  1.  Chronic fractures involve the right superior inferior pubic rami. 2.  Suspect interval fracture of the left pubic rami. 3.  Osteopenia and overlying bowel gas diminishes sensitivity for detecting sacral insufficiency fracture.  If there is a concern for sacral insufficiency fracture then CT or MRI may provide more sensitive assessment of this area.   Original Report Authenticated By: Signa Kell, M.D.    Dg Hip 1 View Left  12/25/2012  *RADIOLOGY  REPORT*  Clinical Data: Status post fall  LEFT HIP - 1 VIEW:a  Comparison: 8/11/six  Findings: The left hip appears located.  No evidence for dislocation.  There is been interval fracture of the left superior and inferior pubic rami compared with 06/20/2005.  Again noted are fractures involving the right superior and inferior pubic rami.  IMPRESSION:  1.  Acute fractures involve the left superior and inferior pubic rami. 2.  Chronic fractures involve the  right pubic rami   Original Report Authenticated By: Signa Kell, M.D.      No diagnosis found.    MDM   Savannah [name] is a 79 y.o. female who was brought in to the ED for concern of mechanical ground level fall.  No hip fracture.  Found to have superior and inferior rami fractures.  Ortho consulted.  Nonoperative management.  Medicine consulted given that she is not able to stand and she lives at home.  Basic labs checked.  Patient safe for admission to floor.  Patient admitted.       Arloa Koh, MD 12/26/12 [phone]

## 2012-12-25 NOTE — ED Notes (Signed)
Pt fell at home; pt c/o left hip pain; no obvious signs of deformity, rotation or shortening noted to extremity; pulses intact; extremity cool to touch; cap refill <3; edema noted to extremities bilaterally; pt given of Fentanyl on sign; pt not complaining of any pain at the moment; upon administration of fentanyl O2 saturation dropped to upper 80's; pt placed on 2L, O2 sats upper 90's;

## 2012-12-25 NOTE — H&P (Signed)
PCP: Renne Crigler   Chief Complaint:   Fall   HPI: Savannah [name] is a 79 y.o. female   has a past medical history of Hypertension; Hypercholesteremia; Pelvis fracture; and Kidney stones.   Presented with  Patient had a mechanical fall today while using her walker. Denies LOS no head injury. Family thinks she got tipped by her own pants that got loose. Hip films found left pubic rami fracture. Dr. Dion Saucier is aware and recommends PT/OT and pain management. She have had history of pelvic fracture in the past in 2005.  Of note she was found to be mildly hyperglycemic but this was postprandial will need to have father evaluation for diabetes.   Review of Systems:    Pertinent positives include: none  Constitutional:  No weight loss, night sweats, Fevers, chills, fatigue, weight loss  HEENT:  No headaches, Difficulty swallowing,Tooth/dental problems,Sore throat,  No sneezing, itching, ear ache, nasal congestion, post nasal drip,  Cardio-vascular:  No chest pain, Orthopnea, PND, anasarca, dizziness, palpitations.no Bilateral lower extremity swelling  GI:  No heartburn, indigestion, abdominal pain, nausea, vomiting, diarrhea, change in bowel habits, loss of appetite, melena, blood in stool, hematemesis Resp:  no shortness of breath at rest. No dyspnea on exertion, No excess mucus, no productive cough, No non-productive cough, No coughing up of blood.No change in color of mucus.No wheezing. Skin:  no rash or lesions. No jaundice GU:  no dysuria, change in color of urine, no urgency or frequency. No straining to urinate.  No flank pain.  Musculoskeletal:  No joint pain or no joint swelling. No decreased range of motion. No back pain.  Psych:  No change in mood or affect. No depression or anxiety. No memory loss.  Neuro: no localizing neurological complaints, no tingling, no weakness, no double vision, no gait abnormality, no slurred speech, no confusion  Otherwise ROS are negative except for  above, 10 systems were reviewed  Past Medical History: Past Medical History  Diagnosis Date  . Hypertension   . Hypercholesteremia   . Pelvis fracture   . Kidney stones    Past Surgical History  Procedure Laterality Date  . Abdominal hysterectomy       Medications: Prior to Admission medications   Medication Sig Start Date End Date Taking? Authorizing Provider  amLODipine (NORVASC) 2.5 MG tablet Take 2.5 mg by mouth daily.   Yes Historical Provider, MD  aspirin EC 81 MG tablet Take 81 mg by mouth daily.   Yes Historical Provider, MD  calcium-vitamin D (OSCAL WITH D) 500-200 MG-UNIT per tablet Take 1 tablet by mouth daily.   Yes Historical Provider, MD  Cholecalciferol (VITAMIN D-3) 1000 UNITS CAPS Take 1,000 Units by mouth daily.   Yes Historical Provider, MD  metoprolol tartrate (LOPRESSOR) 25 MG tablet Take 12.5 mg by mouth 2 (two) times daily.   Yes Historical Provider, MD  risedronate (ACTONEL) 150 MG tablet Take 150 mg by mouth every 30 (thirty) days. with water on empty stomach, nothing by mouth or lie down for next 30 minutes. Take the 1st of every month   Yes Historical Provider, MD  rosuvastatin (CRESTOR) 20 MG tablet Take 20 mg by mouth at bedtime.   Yes Historical Provider, MD    Allergies:  No Known Allergies  Social History:  Ambulatory with walker   Lives at  Home alone   reports that she has quit smoking. She does not have any smokeless tobacco history on file. She reports that she does not drink  alcohol or use illicit drugs.   Family History: family history includes Diabetes in her sister; Heart disease in her father and mother; and Stroke in her brother.    Physical Exam: Patient Vitals for the past 24 hrs:  BP Temp Temp src Pulse Resp SpO2  12/25/12 2208 145/64 mmHg - - - - -  12/25/12 2015 144/61 mmHg - - 56 - 100 %  12/25/12 2000 156/63 mmHg - - 56 - 100 %  12/25/12 1945 161/68 mmHg - - 60 - 99 %  12/25/12 1930 149/59 mmHg - - 54 - 96 %  12/25/12  1924 152/63 mmHg 97.6 F (36.4 C) Oral 51 16 96 %    1. General:  in No Acute distress 2. Psychological: Alert and   Oriented 3. Head/ENT:   Moist   Mucous Membranes                          Head Non traumatic, neck supple                          Normal   Dentition 4. SKIN: normal  Skin turgor,  Skin clean Dry and intact no rash 5. Heart: Regular rate and rhythm no Murmur, Rub or gallop 6. Lungs: Clear to auscultation bilaterally, no wheezes or crackles   7. Abdomen: Soft, non-tender, Non distended 8. Lower extremities: no clubbing, cyanosis, or edema 9. Neurologically Grossly intact, moving all 4 extremities equally 10. MSK: limited due to pain  body mass index is unknown because there is no height or weight on file.   Labs on Admission:  No results found for this basename: NA, K, CL, CO2, GLUCOSE, BUN, CREATININE, CALCIUM, MG, PHOS,  in the last 72 hours No results found for this basename: AST, ALT, ALKPHOS, BILITOT, PROT, ALBUMIN,  in the last 72 hours No results found for this basename: LIPASE, AMYLASE,  in the last 72 hours No results found for this basename: WBC, NEUTROABS, HGB, HCT, MCV, PLT,  in the last 72 hours No results found for this basename: CKTOTAL, CKMB, CKMBINDEX, TROPONINI,  in the last 72 hours No results found for this basename: TSH, T4TOTAL, FREET3, T3FREE, THYROIDAB,  in the last 72 hours No results found for this basename: VITAMINB12, FOLATE, FERRITIN, TIBC, IRON, RETICCTPCT,  in the last 72 hours No results found for this basename: HGBA1C    CrCl is unknown because there is no height on file for the current visit. ABG No results found for this basename: phart, pco2, po2, hco3, tco2, acidbasedef, o2sat     Lab Results  Component Value Date   DDIMER  Value: 0.51        AT THE INHOUSE ESTABLISHED CUTOFF VALUE OF 0.48 ug/mL FEU, THIS ASSAY HAS BEEN DOCUMENTED IN THE LITERATURE TO HAVE A SENSITIVITY AND NEGATIVE PREDICTIVE VALUE OF AT LEAST 98 TO 99%.  THE  TEST RESULT SHOULD BE CORRELATED WITH AN ASSESSMENT OF THE CLINICAL PROBABILITY OF DVT / VTE.* 05/19/2009   Cultures:    Component Value Date/Time   SDES URINE, CLEAN CATCH 05/27/2009 0715   SPECREQUEST PT GIVEN LAST DOSE OF CIPRO 7/16 @ 2000 05/27/2009 0715   CULT Multiple bacterial morphotypes present, none predominant. Suggest appropriate recollection if clinically indicated. 05/27/2009 0715   REPTSTATUS 05/28/2009 FINAL 05/27/2009 0715       Radiological Exams on Admission: Dg Lumbar Spine 2-3 Views  12/25/2012  *RADIOLOGY REPORT*  Clinical Data: Status post fall  LUMBAR SPINE - 2-3 VIEW  Comparison: None.  Findings:  There is a compression deformity involving the T11 vertebra, which is unchanged from 05/20/2009.  There are no new fracture deformities identified.  There is a first-degree anterolisthesis of L4 on L5 measuring 7 mm. Multilevel disc space narrowing and ventral spurring is noted.  Degenerative disc disease is most severe at the L5 S1 level.  Calcified atherosclerotic disease affects the abdominal aorta and its branches.  IMPRESSION:  1.  Lumbar spondylosis. 2.  Stable T11 compression fracture.   Original Report Authenticated By: Signa Kell, M.D.    Dg Pelvis 1-2 Views  12/25/2012  *RADIOLOGY REPORT*  Clinical Data: Status post fall  PELVIS - 1-2 VIEW  Comparison: 06/20/2005  Findings: Bones are diffusely osteopenic and there is bowel gas overlying the sacrum.There are chronic fracture deformities involving the superior and inferior right pubic rami. Cortical irregularity involving the left pubic rami noted which is suspicious for fracture in this area.  Both hips appear located. No evidence for hip fracture.  IMPRESSION:  1.  Chronic fractures involve the right superior inferior pubic rami. 2.  Suspect interval fracture of the left pubic rami. 3.  Osteopenia and overlying bowel gas diminishes sensitivity for detecting sacral insufficiency fracture.  If there is a concern for sacral  insufficiency fracture then CT or MRI may provide more sensitive assessment of this area.   Original Report Authenticated By: Signa Kell, M.D.    Dg Hip 1 View Left  12/25/2012  *RADIOLOGY REPORT*  Clinical Data: Status post fall  LEFT HIP - 1 VIEW:a  Comparison: 8/11/six  Findings: The left hip appears located.  No evidence for dislocation.  There is been interval fracture of the left superior and inferior pubic rami compared with 06/20/2005.  Again noted are fractures involving the right superior and inferior pubic rami.  IMPRESSION:  1.  Acute fractures involve the left superior and inferior pubic rami. 2.  Chronic fractures involve the right pubic rami   Original Report Authenticated By: Signa Kell, M.D.     Chart has been reviewed  Assessment/Plan 79 year old female who is fairly healthy at baseline is by herself at home presents with mechanical fall and resultant pelvic fracture will admit for pain management also was found to be hyperglycemic this will need further workup.  Present on Admission:  Pelvic fracture  - Dr. Dion Saucier is aware will follow up. Will admit for pain management. Activity as per orthopedics, she would likely need placement  . Hypertension   - continue home medications . Hyperglycemia - will obtain hemoglobin A1c and follow blood sugars closely. Patient may have previously undiagnosed diabetes. We'll avoid over aggressive management in this 79 year old female.    Prophylaxis:  Lovenox, Protonix  CODE STATUS: DNR/DNI   Other plan as per orders.  I have spent a total of 55 min on this admission  Savannah [name] 12/25/2012, 11:13 PM

## 2012-12-26 DIAGNOSIS — R739 Hyperglycemia, unspecified: Secondary | ICD-10-CM | POA: Diagnosis present

## 2012-12-26 LAB — URINE MICROSCOPIC-ADD ON

## 2012-12-26 LAB — URINALYSIS, ROUTINE W REFLEX MICROSCOPIC
Hgb urine dipstick: NEGATIVE
Ketones, ur: NEGATIVE mg/dL
Protein, ur: 30 mg/dL — AB
Urobilinogen, UA: 1 mg/dL (ref 0.0–1.0)

## 2012-12-26 LAB — COMPREHENSIVE METABOLIC PANEL
ALT: 9 U/L (ref 0–35)
AST: 14 U/L (ref 0–37)
Calcium: 8.5 mg/dL (ref 8.4–10.5)
Creatinine, Ser: 0.81 mg/dL (ref 0.50–1.10)
GFR calc Af Amer: 71 mL/min — ABNORMAL LOW (ref >90)
Glucose, Bld: 101 mg/dL — ABNORMAL HIGH (ref 70–99)
Sodium: 143 mEq/L (ref 135–145)
Total Protein: 5.6 g/dL — ABNORMAL LOW (ref 6.0–8.3)

## 2012-12-26 LAB — CBC
HCT: 36.2 % (ref 36.0–46.0)
Hemoglobin: 11.4 g/dL — ABNORMAL LOW (ref 12.0–15.0)
MCHC: 31.5 g/dL (ref 30.0–36.0)
RBC: 4.46 MIL/uL (ref 3.87–5.11)

## 2012-12-26 LAB — GLUCOSE, CAPILLARY
Glucose-Capillary: 145 mg/dL — ABNORMAL HIGH (ref 70–99)
Glucose-Capillary: 104 mg/dL — ABNORMAL HIGH (ref 70–99)

## 2012-12-26 LAB — HEMOGLOBIN A1C
Hgb A1c MFr Bld: 6.8 % — ABNORMAL HIGH (ref <5.7)
Mean Plasma Glucose: 148 mg/dL — ABNORMAL HIGH (ref <117)

## 2012-12-26 LAB — BASIC METABOLIC PANEL
Calcium: 9 mg/dL (ref 8.4–10.5)
Creatinine, Ser: 0.82 mg/dL (ref 0.50–1.10)
GFR calc non Af Amer: 61 mL/min — ABNORMAL LOW (ref >90)
Glucose, Bld: 237 mg/dL — ABNORMAL HIGH (ref 70–99)
Sodium: 137 mEq/L (ref 135–145)

## 2012-12-26 LAB — TSH: TSH: 0.803 u[IU]/mL (ref 0.350–4.500)

## 2012-12-26 LAB — MAGNESIUM: Magnesium: 2.1 mg/dL (ref 1.5–2.5)

## 2012-12-26 MED ORDER — ENOXAPARIN SODIUM 40 MG/0.4ML ~~LOC~~ SOLN
40.0000 mg | SUBCUTANEOUS | Status: DC
  Administered 2012-12-26 – 2012-12-28 (×3): 40 mg via SUBCUTANEOUS
  Filled 2012-12-26 (×3): qty 0.4

## 2012-12-26 MED ORDER — INSULIN ASPART 100 UNIT/ML ~~LOC~~ SOLN: 0.0000 [IU] | Freq: Every day | SUBCUTANEOUS | Status: DC

## 2012-12-26 MED ORDER — ONDANSETRON HCL 4 MG/2ML IJ SOLN: 4.0000 mg | Freq: Four times a day (QID) | INTRAMUSCULAR | Status: DC | PRN

## 2012-12-26 MED ORDER — ACETAMINOPHEN 325 MG PO TABS
650.0000 mg | ORAL_TABLET | Freq: Four times a day (QID) | ORAL | Status: DC | PRN
  Filled 2012-12-26: qty 2

## 2012-12-26 MED ORDER — SODIUM CHLORIDE 0.9 % IV SOLN
INTRAVENOUS | Status: AC
  Administered 2012-12-26: 01:00:00 via INTRAVENOUS

## 2012-12-26 MED ORDER — ACETAMINOPHEN 650 MG RE SUPP: 650.0000 mg | Freq: Four times a day (QID) | RECTAL | Status: DC | PRN

## 2012-12-26 MED ORDER — HYDROCODONE-ACETAMINOPHEN 5-325 MG PO TABS: 1.0000 | ORAL_TABLET | ORAL | Status: DC | PRN

## 2012-12-26 MED ORDER — ONDANSETRON HCL 4 MG PO TABS: 4.0000 mg | ORAL_TABLET | Freq: Four times a day (QID) | ORAL | Status: DC | PRN

## 2012-12-26 MED ORDER — METOPROLOL TARTRATE 12.5 MG HALF TABLET
12.5000 mg | ORAL_TABLET | Freq: Two times a day (BID) | ORAL | Status: DC
  Administered 2012-12-26 – 2012-12-28 (×6): 12.5 mg via ORAL
  Filled 2012-12-26 (×7): qty 1

## 2012-12-26 MED ORDER — AMLODIPINE BESYLATE 2.5 MG PO TABS
2.5000 mg | ORAL_TABLET | Freq: Every day | ORAL | Status: DC
  Administered 2012-12-26 – 2012-12-28 (×3): 2.5 mg via ORAL
  Filled 2012-12-26 (×3): qty 1

## 2012-12-26 MED ORDER — MORPHINE SULFATE 2 MG/ML IJ SOLN
2.0000 mg | INTRAMUSCULAR | Status: DC | PRN
  Administered 2012-12-26 – 2012-12-28 (×3): 2 mg via INTRAVENOUS
  Filled 2012-12-26 (×4): qty 1

## 2012-12-26 MED ORDER — DOCUSATE SODIUM 100 MG PO CAPS
100.0000 mg | ORAL_CAPSULE | Freq: Two times a day (BID) | ORAL | Status: DC
  Administered 2012-12-26 – 2012-12-28 (×5): 100 mg via ORAL
  Filled 2012-12-26 (×6): qty 1

## 2012-12-26 MED ORDER — INSULIN ASPART 100 UNIT/ML ~~LOC~~ SOLN
0.0000 [IU] | Freq: Three times a day (TID) | SUBCUTANEOUS | Status: DC
  Administered 2012-12-26 (×2): 1 [IU] via SUBCUTANEOUS

## 2012-12-26 MED ORDER — ATORVASTATIN CALCIUM 40 MG PO TABS
40.0000 mg | ORAL_TABLET | Freq: Every day | ORAL | Status: DC
  Administered 2012-12-27: 40 mg via ORAL
  Filled 2012-12-26 (×3): qty 1

## 2012-12-26 MED ORDER — ASPIRIN EC 81 MG PO TBEC
81.0000 mg | DELAYED_RELEASE_TABLET | Freq: Every day | ORAL | Status: DC
  Administered 2012-12-26 – 2012-12-28 (×3): 81 mg via ORAL
  Filled 2012-12-26 (×3): qty 1

## 2012-12-26 NOTE — Consult Note (Signed)
ORTHOPAEDIC CONSULTATION  REQUESTING PHYSICIAN: Therisa Doyne, MD  Chief Complaint: Pelvic and low back pain  HPI: Savannah [name] is a 79 y.o. female who complains of  pelvic and low back pain after she fell today in the kitchen. She has a past history of a pubic ramus fracture that occurred about 9 years ago. She denies any loss loss of consciousness in the current fall. She normally walks with a walker. After the fall she had acute onset moderate to severe pain, and was unable to walk, and was brought in the emergency room, and orthopedic consultation requested. She denies any other injuries in the fall. Rest makes it better, weightbearing it worse.  Past Medical History  Diagnosis Date  . Hypertension   . Hypercholesteremia   . Pelvis fracture   . Kidney stones    Past Surgical History  Procedure Laterality Date  . Abdominal hysterectomy     History   Social History  . Marital Status: Widowed    Spouse Name: N/A    Number of Children: N/A  . Years of Education: N/A   Social History Main Topics  . Smoking status: Former Games developer  . Smokeless tobacco: None  . Alcohol Use: No  . Drug Use: No  . Sexually Active: None   Other Topics Concern  . None   Social History Narrative  . None   Family History  Problem Relation Age of Onset  . Heart disease Mother   . Heart disease Father   . Diabetes Sister   . Stroke Brother    No Known Allergies Prior to Admission medications   Medication Sig Start Date End Date Taking? Authorizing Provider  amLODipine (NORVASC) 2.5 MG tablet Take 2.5 mg by mouth daily.   Yes Historical Provider, MD  aspirin EC 81 MG tablet Take 81 mg by mouth daily.   Yes Historical Provider, MD  calcium-vitamin D (OSCAL WITH D) 500-200 MG-UNIT per tablet Take 1 tablet by mouth daily.   Yes Historical Provider, MD  Cholecalciferol (VITAMIN D-3) 1000 UNITS CAPS Take 1,000 Units by mouth daily.   Yes Historical Provider, MD  metoprolol tartrate  (LOPRESSOR) 25 MG tablet Take 12.5 mg by mouth 2 (two) times daily.   Yes Historical Provider, MD  risedronate (ACTONEL) 150 MG tablet Take 150 mg by mouth every 30 (thirty) days. with water on empty stomach, nothing by mouth or lie down for next 30 minutes. Take the 1st of every month   Yes Historical Provider, MD  rosuvastatin (CRESTOR) 20 MG tablet Take 20 mg by mouth at bedtime.   Yes Historical Provider, MD   Dg Lumbar Spine 2-3 Views  12/25/2012  *RADIOLOGY REPORT*  Clinical Data: Status post fall  LUMBAR SPINE - 2-3 VIEW  Comparison: None.  Findings:  There is a compression deformity involving the T11 vertebra, which is unchanged from 05/20/2009.  There are no new fracture deformities identified.  There is a first-degree anterolisthesis of L4 on L5 measuring 7 mm. Multilevel disc space narrowing and ventral spurring is noted.  Degenerative disc disease is most severe at the L5 S1 level.  Calcified atherosclerotic disease affects the abdominal aorta and its branches.  IMPRESSION:  1.  Lumbar spondylosis. 2.  Stable T11 compression fracture.   Original Report Authenticated By: Signa Kell, M.D.    Dg Pelvis 1-2 Views  12/25/2012  *RADIOLOGY REPORT*  Clinical Data: Status post fall  PELVIS - 1-2 VIEW  Comparison: 06/20/2005  Findings: Bones are diffusely osteopenic  and there is bowel gas overlying the sacrum.There are chronic fracture deformities involving the superior and inferior right pubic rami. Cortical irregularity involving the left pubic rami noted which is suspicious for fracture in this area.  Both hips appear located. No evidence for hip fracture.  IMPRESSION:  1.  Chronic fractures involve the right superior inferior pubic rami. 2.  Suspect interval fracture of the left pubic rami. 3.  Osteopenia and overlying bowel gas diminishes sensitivity for detecting sacral insufficiency fracture.  If there is a concern for sacral insufficiency fracture then CT or MRI may provide more sensitive  assessment of this area.   Original Report Authenticated By: Signa Kell, M.D.    Dg Hip 1 View Left  12/25/2012  *RADIOLOGY REPORT*  Clinical Data: Status post fall  LEFT HIP - 1 VIEW:a  Comparison: 8/11/six  Findings: The left hip appears located.  No evidence for dislocation.  There is been interval fracture of the left superior and inferior pubic rami compared with 06/20/2005.  Again noted are fractures involving the right superior and inferior pubic rami.  IMPRESSION:  1.  Acute fractures involve the left superior and inferior pubic rami. 2.  Chronic fractures involve the right pubic rami   Original Report Authenticated By: Signa Kell, M.D.     Positive ROS: All other systems have been reviewed and were otherwise negative with the exception of those mentioned in the HPI and as above.  Physical Exam: General: Alert, no acute distress, she says she is hungry wants to eat. Cardiovascular: No pedal edema Respiratory: No cyanosis, no use of accessory musculature GI: No organomegaly, abdomen is soft and non-tender Skin: No lesions in the area of chief complaint Neurologic: Sensation intact distally Psychiatric: Patient is competent for consent with normal mood and affect Lymphatic: No axillary or cervical lymphadenopathy  MUSCULOSKELETAL: Her pelvis does have some pain to palpation of the left side. This is clinically stable. EHL and FHL are firing.  Assessment: Recurrent pubic ramus fracture on the left side. She also has advanced age, and generalized deconditioning, inability to care for herself, and a new loss of ambulatory function.  Plan: This is an acute severe problem, and take at least 2-3 months to recover from. She is okay to be weight bearing as tolerated from my standpoint. We will have physical therapy work with her, and try and optimize function. She may need skilled nursing placement. I would recommend Tylenol for pain control, and low-dose hydrocodone if absolutely  necessary. She is going to be admitted to the medical service, and I will plan to see her in my office in approximately 2 weeks after discharge.  Thank you for this consultation, and I will check back in with her periodically while she is in-house.    Tee Richeson P, MD Cell [phone] Pager [phone]  12/26/2012 1:57 AM

## 2012-12-26 NOTE — ED Provider Notes (Signed)
I saw and evaluated the patient, reviewed the resident's note and I agree with the findings and plan.  Savannah Chick, MD 12/26/12 [phone]

## 2012-12-26 NOTE — Progress Notes (Signed)
PATIENT DETAILS Name: Savannah [name] Age: 79 y.o. Sex: female Date of Birth: 10-29-1919 Admit Date: 12/25/2012 Admitting Physician Therisa Doyne, MD PCP:No primary provider on file.  Subjective: Admitted with Fall and subsequent pubic bone fracture  Assessment/Plan: Active Problems:   Pubic bone fracture-left - After a mechanical fall - Physical therapy - May need SNF    Fall - Mechanical fall - No history of syncope - No further workup needed    Hypertension - Continue with amlodipine and metoprolol -BP controlled    Hyperglycemia - Continue with SSI - Likely undiagnosed diabetes-await A1c  Dyslipidemia - Continue with statin  Disposition: Remain inpatient  DVT Prophylaxis: Prophylactic Lovenox or Heparin  Code Status: DNR  Procedures:  NONE  CONSULTS:  orthopedic surgery  PHYSICAL EXAM: Vital signs in last 24 hours: Filed Vitals:   12/25/12 2330 12/25/12 2345 12/26/12 0109 12/26/12 0455  BP: 137/74 148/60 159/74 121/68  Pulse: 70 66 73 65  Temp:   99 F (37.2 C) 97.7 F (36.5 C)  TempSrc:   Oral Oral  Resp:   16 16  Height:   5' 3.6" (1.615 m)   Weight:   76.34 kg (168 lb 4.8 oz)   SpO2: 99% 99% 93% 95%    Weight change:  Body mass index is 29.27 kg/(m^2).   Gen Exam: Awake and alert with clear speech.  Neck: Supple, No JVD.  Chest: B/L Clear.   CVS: S1 S2 Regular, no murmurs. Abdomen: soft, BS +, non tender, non distended.  Extremities: no edema, lower extremities warm to touch. Neurologic: Non Focal.   Skin: No Rash.   Wounds: N/A.    Intake/Output from previous day:  Intake/Output Summary (Last 24 hours) at 12/26/12 1311 Last data filed at 12/26/12 0900  Gross per 24 hour  Intake 438.33 ml  Output      0 ml  Net 438.33 ml     LAB RESULTS: CBC  Recent Labs Lab 12/25/12 2330 12/26/12 0541  WBC 11.8* 7.7  HGB 12.3 11.4*  HCT 39.2 36.2  PLT 184 185  MCV 81.2 81.2  MCH 25.5* 25.6*  MCHC 31.4 31.5  RDW 14.9 14.9   LYMPHSABS 0.9  --   MONOABS 0.7  --   EOSABS 0.0  --   BASOSABS 0.0  --     Chemistries   Recent Labs Lab 12/25/12 2330 12/26/12 0541  NA 137 143  K 4.7 4.1  CL 101 104  CO2 32 28  GLUCOSE 237* 101*  BUN 23 24*  CREATININE 0.82 0.81  CALCIUM 9.0 8.5  MG  --  2.1    CBG:  Recent Labs Lab 12/26/12 0123 12/26/12 1222  GLUCAP 166* 136*    GFR Estimated Creatinine Clearance: 44.8 ml/min (by C-G formula based on Cr of 0.81).  Coagulation profile No results found for this basename: INR, PROTIME,  in the last 168 hours  Cardiac Enzymes No results found for this basename: CK, CKMB, TROPONINI, MYOGLOBIN,  in the last 168 hours  No components found with this basename: POCBNP,  No results found for this basename: DDIMER,  in the last 72 hours  Recent Labs  12/26/12 0541  HGBA1C 6.8*   No results found for this basename: CHOL, HDL, LDLCALC, TRIG, CHOLHDL, LDLDIRECT,  in the last 72 hours No results found for this basename: TSH, T4TOTAL, FREET3, T3FREE, THYROIDAB,  in the last 72 hours No results found for this basename: VITAMINB12, FOLATE, FERRITIN, TIBC, IRON, RETICCTPCT,  in  the last 72 hours No results found for this basename: LIPASE, AMYLASE,  in the last 72 hours  Urine Studies No results found for this basename: UACOL, UAPR, USPG, UPH, UTP, UGL, UKET, UBIL, UHGB, UNIT, UROB, ULEU, UEPI, UWBC, URBC, UBAC, CAST, CRYS, UCOM, BILUA,  in the last 72 hours  MICROBIOLOGY: No results found for this or any previous visit (from the past 240 hour(s)).  RADIOLOGY STUDIES/RESULTS: Dg Lumbar Spine 2-3 Views  12/25/2012  *RADIOLOGY REPORT*  Clinical Data: Status post fall  LUMBAR SPINE - 2-3 VIEW  Comparison: None.  Findings:  There is a compression deformity involving the T11 vertebra, which is unchanged from 05/20/2009.  There are no new fracture deformities identified.  There is a first-degree anterolisthesis of L4 on L5 measuring 7 mm. Multilevel disc space narrowing and  ventral spurring is noted.  Degenerative disc disease is most severe at the L5 S1 level.  Calcified atherosclerotic disease affects the abdominal aorta and its branches.  IMPRESSION:  1.  Lumbar spondylosis. 2.  Stable T11 compression fracture.   Original Report Authenticated By: Signa Kell, M.D.    Dg Pelvis 1-2 Views  12/25/2012  *RADIOLOGY REPORT*  Clinical Data: Status post fall  PELVIS - 1-2 VIEW  Comparison: 06/20/2005  Findings: Bones are diffusely osteopenic and there is bowel gas overlying the sacrum.There are chronic fracture deformities involving the superior and inferior right pubic rami. Cortical irregularity involving the left pubic rami noted which is suspicious for fracture in this area.  Both hips appear located. No evidence for hip fracture.  IMPRESSION:  1.  Chronic fractures involve the right superior inferior pubic rami. 2.  Suspect interval fracture of the left pubic rami. 3.  Osteopenia and overlying bowel gas diminishes sensitivity for detecting sacral insufficiency fracture.  If there is a concern for sacral insufficiency fracture then CT or MRI may provide more sensitive assessment of this area.   Original Report Authenticated By: Signa Kell, M.D.    Dg Hip 1 View Left  12/25/2012  *RADIOLOGY REPORT*  Clinical Data: Status post fall  LEFT HIP - 1 VIEW:a  Comparison: 8/11/six  Findings: The left hip appears located.  No evidence for dislocation.  There is been interval fracture of the left superior and inferior pubic rami compared with 06/20/2005.  Again noted are fractures involving the right superior and inferior pubic rami.  IMPRESSION:  1.  Acute fractures involve the left superior and inferior pubic rami. 2.  Chronic fractures involve the right pubic rami   Original Report Authenticated By: Signa Kell, M.D.     MEDICATIONS: Scheduled Meds: . amLODipine  2.5 mg Oral Daily  . aspirin EC  81 mg Oral Daily  . atorvastatin  40 mg Oral q1800  . docusate sodium  100 mg  Oral BID  . enoxaparin (LOVENOX) injection  40 mg Subcutaneous Q24H  . insulin aspart  0-5 Units Subcutaneous QHS  . insulin aspart  0-9 Units Subcutaneous TID WC  . metoprolol tartrate  12.5 mg Oral BID   Continuous Infusions:  PRN Meds:.acetaminophen, acetaminophen, HYDROcodone-acetaminophen, morphine injection, ondansetron (ZOFRAN) IV, ondansetron  Antibiotics: Anti-infectives   None       Jeoffrey Massed, MD  [facility] Pager:[phone]  If 7PM-7AM, please contact night-coverage [email or web address] Password [email or web address] 12/26/2012, 1:11 PM   LOS: 1 day

## 2012-12-26 NOTE — Evaluation (Signed)
Physical Therapy Evaluation Patient Details Name: Savannah [name] MRN: [id number] DOB: July 12, 1919 Today's Date: 12/26/2012 Time: 4098-1191 PT Time Calculation (min): 23 min  PT Assessment / Plan / Recommendation Clinical Impression  Patient is a 79 yo female admitted with Lt. pubic rami fracture following fall.  Patient with increased pain and with decreased cognition.  Patient is not safe to return home alone at this time.  Recommend SNF at discharge for continued therapy.  Will benefit from acute PT to maximize independence prior to discharge.    PT Assessment  Patient needs continued PT services    Follow Up Recommendations  SNF    Does the patient have the potential to tolerate intense rehabilitation      Barriers to Discharge Decreased caregiver support      Equipment Recommendations  None recommended by PT    Recommendations for Other Services     Frequency Min 3X/week    Precautions / Restrictions Precautions Precautions: Fall Restrictions Weight Bearing Restrictions: Yes LLE Weight Bearing: Weight bearing as tolerated   Pertinent Vitals/Pain Pain limiting mobility today      Mobility  Bed Mobility Bed Mobility: Rolling Right;Rolling Left;Right Sidelying to Sit;Sit to Supine Rolling Right: 2: Max assist;With rail Rolling Left: 1: +1 Total assist (Unable to roll completely to left due to pain) Right Sidelying to Sit: 1: +2 Total assist;HOB elevated Right Sidelying to Sit: Patient Percentage: 20% Sit to Supine: 1: +2 Total assist Sit to Supine: Patient Percentage: 0% Details for Bed Mobility Assistance: Verbal and tactile cues for technique.  Unable to roll completely to left due to increased pain.  Required total assist to complete all bed mobility.  Anxious about moving, anticipating pain.  Patient sat at EOB x 8 minutes, performing exercises. Transfers Transfers: Not assessed (Unable due to increased pain - patient declined)    Exercises General Exercises -  Lower Extremity Ankle Circles/Pumps: AROM;Both;10 reps;Seated Long Arc Quad: AROM;Both;5 reps;Seated Hip ABduction/ADduction: AROM;Both;5 reps;Seated   PT Diagnosis: Difficulty walking;Generalized weakness;Acute pain;Altered mental status  PT Problem List: Decreased strength;Decreased range of motion;Decreased activity tolerance;Decreased mobility;Decreased cognition;Decreased knowledge of use of DME;Pain PT Treatment Interventions: DME instruction;Gait training;Functional mobility training;Therapeutic exercise;Patient/family education;Cognitive remediation   PT Goals Acute Rehab PT Goals PT Goal Formulation: With patient Time For Goal Achievement: 01/09/13 Potential to Achieve Goals: Good Pt will go Supine/Side to Sit: with min assist;with HOB 0 degrees PT Goal: Supine/Side to Sit - Progress: Goal set today Pt will go Sit to Supine/Side: with min assist;with HOB 0 degrees PT Goal: Sit to Supine/Side - Progress: Goal set today Pt will go Sit to Stand: with min assist;with upper extremity assist PT Goal: Sit to Stand - Progress: Goal set today Pt will Transfer Bed to Chair/Chair to Bed: with min assist PT Transfer Goal: Bed to Chair/Chair to Bed - Progress: Goal set today Pt will Ambulate: 51 - 150 feet;with min assist;with rolling walker PT Goal: Ambulate - Progress: Goal set today  Visit Information  Last PT Received On: 12/26/12 Assistance Needed: +2    Subjective Data  Subjective: "Please don't make me do this."  Patient states this due to increased pain with movement. 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 Awareness of Errors: Assistance required to identify errors made;Assistance required to correct errors made Awareness of Errors - Other Comments: States she is at home.  Corrected patient on location. Cognition - Other Comments: When asked patient how to call nursing for assistance, she stated "call 911".  Re-oriented patient to call bell.    Extremity/Trunk Assessment Right Upper Extremity Assessment RUE ROM/Strength/Tone: Deficits;Unable to fully assess;Due to pain RUE ROM/Strength/Tone Deficits: Decreased shoulder ROM and strength.  Patient reports difficult to move due to back pain. Left Upper Extremity Assessment LUE ROM/Strength/Tone: [facility] for tasks assessed Right Lower Extremity Assessment RLE ROM/Strength/Tone: Deficits;Unable to fully assess;Due to pain RLE ROM/Strength/Tone Deficits: Difficulty moving against gravity. Left Lower Extremity Assessment LLE ROM/Strength/Tone: Deficits;Unable to fully assess;Due to pain LLE ROM/Strength/Tone Deficits: Difficulty moving against gravity.   Balance Balance Balance Assessed: Yes Static Sitting Balance Static Sitting - Balance Support: Bilateral upper extremity supported;Feet supported Static Sitting - Level of Assistance: 5: Stand by assistance Static Sitting - Comment/# of Minutes: 8  End of Session PT - End of Session Equipment Utilized During Treatment: Oxygen Activity Tolerance: Patient limited by pain (Self limited - anxious) Patient left: in bed;with call bell/phone within reach Nurse  Communication: Mobility status;Need for lift equipment (Patient does not know how to call for assist ("911"))  GP     Vena Austria 12/26/2012, 1:43 PM Durenda Hurt. Renaldo Fiddler, [facility] Acute Rehab Services Pager [phone]

## 2012-12-26 NOTE — Progress Notes (Signed)
Patient ID: Savannah [name], female   DOB: Feb 04, 1919, 79 y.o.   MRN: [id number]     Subjective:  Patient reports pain as mild to moderate.  She is sitting up in bed and eating breakfast at time of visit.  Objective:   VITALS:   Filed Vitals:   12/25/12 2330 12/25/12 2345 12/26/12 0109 12/26/12 0455  BP: 137/74 148/60 159/74 121/68  Pulse: 70 66 73 65  Temp:   99 F (37.2 C) 97.7 F (36.5 C)  TempSrc:   Oral Oral  Resp:   16 16  Height:   5' 3.6" (1.615 m)   Weight:   76.34 kg (168 lb 4.8 oz)   SpO2: 99% 99% 93% 95%    ABD soft Sensation intact distally Dorsiflexion/Plantar flexion intact   Lab Results  Component Value Date   WBC 7.7 12/26/2012   HGB 11.4* 12/26/2012   HCT 36.2 12/26/2012   MCV 81.2 12/26/2012   PLT 185 12/26/2012     Assessment/Plan:     Active Problems:   Pubic bone fracture   Fall   Hypertension   Hyperglycemia   Advance diet Up with therapy WBAT Continue plan per medicine.   Haskel Khan 12/26/2012, 11:32 AM   Teryl Lucy, MD Cell [phone] Pager [phone]

## 2012-12-27 MED ORDER — POLYETHYLENE GLYCOL 3350 17 G PO PACK
17.0000 g | PACK | Freq: Every day | ORAL | Status: DC
  Administered 2012-12-27 – 2012-12-28 (×2): 17 g via ORAL
  Filled 2012-12-27 (×2): qty 1

## 2012-12-27 MED ORDER — WHITE PETROLATUM GEL
Status: AC
  Administered 2012-12-27: 0.2
  Filled 2012-12-27: qty 5

## 2012-12-27 MED ORDER — METFORMIN HCL 500 MG PO TABS
500.0000 mg | ORAL_TABLET | Freq: Every day | ORAL | Status: DC
  Administered 2012-12-28: 500 mg via ORAL
  Filled 2012-12-27 (×2): qty 1

## 2012-12-27 NOTE — Evaluation (Signed)
Occupational Therapy Evaluation Patient Details Name: Savannah [name] MRN: [id number] DOB: 10/15/1919 Today's Date: 12/27/2012 Time: 4098-1191 OT Time Calculation (min): 26 min  OT Assessment / Plan / Recommendation Clinical Impression  This 79 yo female s/p fall with resultant left pelvic fracture presents to acute OT with problems below. Will benefit from acute OT with follow up OT at SNF.    OT Assessment  Patient needs continued OT Services    Follow Up Recommendations  SNF    Barriers to Discharge Decreased caregiver support    Equipment Recommendations  None recommended by OT       Frequency  Min 2X/week    Precautions / Restrictions Precautions Precautions: Fall Restrictions LLE Weight Bearing: Weight bearing as tolerated   Pertinent Vitals/Pain Increased with activity    ADL  Eating/Feeding: Simulated;Supervision/safety;Set up Where Assessed - Eating/Feeding: Bed level Grooming: Simulated;Moderate assistance Where Assessed - Grooming: Unsupported sitting Upper Body Bathing: Simulated;Moderate assistance Where Assessed - Upper Body Bathing: Unsupported sitting Lower Body Bathing: Simulated;+1 Total assistance (With additional one to help stand with Bari-stedy) Where Assessed - Lower Body Bathing: Supported sit to stand Upper Body Dressing: Simulated;+1 Total assistance Where Assessed - Upper Body Dressing: Unsupported sitting Lower Body Dressing: Simulated;+1 Total assistance (With additional one to help stand with Bari-stedy) Where Assessed - Lower Body Dressing: Supported sit to Pharmacist, hospital: Performed;+2 Total assistance (with [employer]) Toilet Transfer Method:  (Bed to recliner) Acupuncturist:  (Bari stedy) Toileting - Architect and Hygiene: Performed;+1 Total assistance (with additional +1 for standing) Where Assessed - Glass blower/designer Manipulation and Hygiene: Standing Equipment Used: Gait belt  (Bari-stedy) Transfers/Ambulation Related to ADLs: Sit to stand with [place] stedy total A +2 (pt=50%) without Bari-stedy total A +2 (pt=30%), unable to ambulate at this time    OT Diagnosis: Cognitive deficits;Acute pain;Generalized weakness  OT Problem List: Decreased strength;Decreased range of motion;Impaired balance (sitting and/or standing);Decreased cognition;Impaired UE functional use;Pain OT Treatment Interventions: Self-care/ADL training;DME and/or AE instruction;Patient/family education;Balance training   OT Goals Acute Rehab OT Goals OT Goal Formulation: With patient Time For Goal Achievement: 01/10/13 Potential to Achieve Goals: Good ADL Goals Pt Will Perform Grooming: with min assist;Unsupported;Sitting, edge of bed ADL Goal: Grooming - Progress: Goal set today Pt Will Transfer to Toilet: with max assist;Stand pivot transfer;3-in-1 ADL Goal: Toilet Transfer - Progress: Goal set today Miscellaneous OT Goals Miscellaneous OT Goal #1: Pt will be able to stand with Mod A to A with tolieting/ADLS (with or without Geralyn Corwin) OT Goal: Miscellaneous Goal #1 - Progress: Goal set today Miscellaneous OT Goal #2: Pt will be able to roll right and left with Mod A to A with BADLs at a bed level. OT Goal: Miscellaneous Goal #2 - Progress: Goal set today Miscellaneous OT Goal #3: Pt will be able to come up to sit with Max A in prep for transfers OT Goal: Miscellaneous Goal #3 - Progress: Goal set today  Visit Information  Last OT Received On: 12/27/12 Assistance Needed: +2 PT/OT Co-Evaluation/Treatment: Yes       Prior Functioning     Home Living Lives With: Alone Available Help at Discharge: Skilled Nursing Facility Home Adaptive Equipment: Walker - rolling Prior Function Level of Independence: Independent with assistive device(s);Needs assistance Needs Assistance: Light Housekeeping;Meal Prep Meal Prep: Moderate Light Housekeeping: Maximal Able to Take Stairs?: No Driving:  No Vocation: Retired Musician: HOH Dominant Hand: Right         Vision/Perception Vision - History Baseline  Vision: No visual deficits Visual History: Cataracts (removed) Patient Visual Report: No change from baseline (although eyes do not look quite right)   Cognition  Cognition Overall Cognitive Status: Impaired Area of Impairment: Memory;Awareness of errors;Awareness of deficits Arousal/Alertness: Awake/alert Behavior During Session: [facility] for tasks performed Memory Deficits: asked same questions repeatedly Awareness of Errors: Assistance required to identify errors made;Assistance required to correct errors made    Extremity/Trunk Assessment Right Upper Extremity Assessment RUE ROM/Strength/Tone: Deficits RUE ROM/Strength/Tone Deficits:  (Decreased AROM at shoulder flexion, rest 3/5) Left Upper Extremity Assessment LUE ROM/Strength/Tone: Within functional levels     Mobility Bed Mobility Bed Mobility: Supine to Sit Supine to Sit: 1: +2 Total assist Supine to Sit: Patient Percentage: 10% Details for Bed Mobility Assistance: Assist to move legs and to bring trunk up. Transfers Transfers: Sit to Stand;Stand to Sit Sit to Stand: 1: +2 Total assist;With upper extremity assist;From bed Sit to Stand: Patient Percentage: 50% (using Stedy) Stand to Sit: 1: +2 Total assist Stand to Sit: Patient Percentage: 50% Transfer via Lift Equipment: Stedy Details for Transfer Assistance: Unable to fully stand pt using walker with +2 total (pt = 30%).  Pt able to achieve full standing using Stedy and able to transfer to chair with Stedy.        Balance Balance Balance Assessed: Yes Static Sitting Balance Static Sitting - Balance Support: Bilateral upper extremity supported;Feet supported Static Sitting - Level of Assistance:  (Min guard A) Static Standing Balance Static Standing - Balance Support: Bilateral upper extremity supported Static Standing - Level of  Assistance: 1: +2 Total assist Static Standing - Comment/# of Minutes: Pt stood with Stedy x 30 sec with +2 total (pt=60%)   End of Session OT - End of Session Equipment Utilized During Treatment: Gait belt Geralyn Corwin) Activity Tolerance: Patient tolerated treatment well Patient left: in chair;with call bell/phone within reach Nurse Communication: Need for lift equipment Antony Salmon)    Evette Georges [phone] 12/27/2012, 12:22 PM

## 2012-12-27 NOTE — Care Management Note (Signed)
    Page 1 of 1   12/28/2012     10:59:46 AM   CARE MANAGEMENT NOTE 12/28/2012  Patient:  Savannah [name], Savannah [name]   Account Number:  [id number]  Date Initiated:  12/27/2012  Documentation initiated by:  Letha Cape  Subjective/Objective Assessment:   dx pelvis fx  admit- lives alone.     Action/Plan:   pt/ot eval- recs snf   Anticipated DC Date:  12/28/2012   Anticipated DC Plan:  SKILLED NURSING FACILITY  In-house referral  Clinical Social Worker      DC Planning Services  CM consult      Choice offered to / List presented to:             Status of service:  Completed, signed off Medicare Important Message given?   (If response is "NO", the following Medicare IM given date fields will be blank) Date Medicare IM given:   Date Additional Medicare IM given:    Discharge Disposition:  SKILLED NURSING FACILITY  Per UR Regulation:  Reviewed for med. necessity/level of care/duration of stay  If discussed at Long Length of Stay Meetings, dates discussed:    Comments:  12/28/12 10:58 Letha Cape RN, BSN [phone] patient is for dc to snf today, CSW following.  12/27/12 11:40 Letha Cape RN, BSN [phone] patient lives alone, per physical therapy recs SNF, CSW following.

## 2012-12-27 NOTE — Progress Notes (Signed)
PATIENT DETAILS Name: Savannah [name] Age: 79 y.o. Sex: female Date of Birth: 1919/01/31 Admit Date: 12/25/2012 Admitting Physician Therisa Doyne, MD PCP:No primary provider on file.  Subjective: Admitted with Fall and subsequent pubic bone fracture  Assessment/Plan: Active Problems:   Pubic bone fracture-left - After a mechanical fall - Physical therapy - May need SNF    Fall - Mechanical fall - No history of syncope - No further workup needed    Hypertension - Continue with amlodipine and metoprolol -BP controlled    Hyperglycemia - Continue with SSI - A1c 6.8-start Metformin  Dyslipidemia - Continue with statin  Disposition: Remain inpatient-needs SNF on discharge  DVT Prophylaxis: Prophylactic Lovenox  Code Status: DNR  Procedures:  NONE  CONSULTS:  orthopedic surgery  PHYSICAL EXAM: Vital signs in last 24 hours: Filed Vitals:   12/26/12 1429 12/26/12 2119 12/27/12 0201 12/27/12 0600  BP: 109/60 138/56 133/72 153/68  Pulse: 74 67 64 71  Temp: 97.6 F (36.4 C) 97.7 F (36.5 C) 97.6 F (36.4 C) 97.8 F (36.6 C)  TempSrc: Oral Oral Oral Oral  Resp: 18 18 18 18   Height:      Weight:      SpO2: 93% 94% 90% 91%    Weight change:  Body mass index is 29.27 kg/(m^2).   Gen Exam: Awake and alert with clear speech.  Neck: Supple, No JVD.  Chest: B/L Clear.   CVS: S1 S2 Regular, no murmurs. Abdomen: soft, BS +, non tender, non distended.  Extremities: no edema, lower extremities warm to touch. Neurologic: Non Focal.   Skin: No Rash.   Wounds: N/A.    Intake/Output from previous day:  Intake/Output Summary (Last 24 hours) at 12/27/12 1104 Last data filed at 12/27/12 0915  Gross per 24 hour  Intake    720 ml  Output    200 ml  Net    520 ml     LAB RESULTS: CBC  Recent Labs Lab 12/25/12 2330 12/26/12 0541  WBC 11.8* 7.7  HGB 12.3 11.4*  HCT 39.2 36.2  PLT 184 185  MCV 81.2 81.2  MCH 25.5* 25.6*  MCHC 31.4 31.5  RDW 14.9  14.9  LYMPHSABS 0.9  --   MONOABS 0.7  --   EOSABS 0.0  --   BASOSABS 0.0  --     Chemistries   Recent Labs Lab 12/25/12 2330 12/26/12 0541  NA 137 143  K 4.7 4.1  CL 101 104  CO2 32 28  GLUCOSE 237* 101*  BUN 23 24*  CREATININE 0.82 0.81  CALCIUM 9.0 8.5  MG  --  2.1    CBG:  Recent Labs Lab 12/26/12 0123 12/26/12 1222 12/26/12 1653 12/26/12 2115 12/27/12 0754  GLUCAP 166* 136* 145* 104* 82    GFR Estimated Creatinine Clearance: 44.8 ml/min (by C-G formula based on Cr of 0.81).  Coagulation profile No results found for this basename: INR, PROTIME,  in the last 168 hours  Cardiac Enzymes No results found for this basename: CK, CKMB, TROPONINI, MYOGLOBIN,  in the last 168 hours  No components found with this basename: POCBNP,  No results found for this basename: DDIMER,  in the last 72 hours  Recent Labs  12/26/12 0541  HGBA1C 6.8*   No results found for this basename: CHOL, HDL, LDLCALC, TRIG, CHOLHDL, LDLDIRECT,  in the last 72 hours  Recent Labs  12/26/12 0541  TSH 0.803   No results found for this basename: VITAMINB12, FOLATE, FERRITIN,  TIBC, IRON, RETICCTPCT,  in the last 72 hours No results found for this basename: LIPASE, AMYLASE,  in the last 72 hours  Urine Studies No results found for this basename: UACOL, UAPR, USPG, UPH, UTP, UGL, UKET, UBIL, UHGB, UNIT, UROB, ULEU, UEPI, UWBC, URBC, UBAC, CAST, CRYS, UCOM, BILUA,  in the last 72 hours  MICROBIOLOGY: No results found for this or any previous visit (from the past 240 hour(s)).  RADIOLOGY STUDIES/RESULTS: Dg Lumbar Spine 2-3 Views  12/25/2012  *RADIOLOGY REPORT*  Clinical Data: Status post fall  LUMBAR SPINE - 2-3 VIEW  Comparison: None.  Findings:  There is a compression deformity involving the T11 vertebra, which is unchanged from 05/20/2009.  There are no new fracture deformities identified.  There is a first-degree anterolisthesis of L4 on L5 measuring 7 mm. Multilevel disc space  narrowing and ventral spurring is noted.  Degenerative disc disease is most severe at the L5 S1 level.  Calcified atherosclerotic disease affects the abdominal aorta and its branches.  IMPRESSION:  1.  Lumbar spondylosis. 2.  Stable T11 compression fracture.   Original Report Authenticated By: Signa Kell, M.D.    Dg Pelvis 1-2 Views  12/25/2012  *RADIOLOGY REPORT*  Clinical Data: Status post fall  PELVIS - 1-2 VIEW  Comparison: 06/20/2005  Findings: Bones are diffusely osteopenic and there is bowel gas overlying the sacrum.There are chronic fracture deformities involving the superior and inferior right pubic rami. Cortical irregularity involving the left pubic rami noted which is suspicious for fracture in this area.  Both hips appear located. No evidence for hip fracture.  IMPRESSION:  1.  Chronic fractures involve the right superior inferior pubic rami. 2.  Suspect interval fracture of the left pubic rami. 3.  Osteopenia and overlying bowel gas diminishes sensitivity for detecting sacral insufficiency fracture.  If there is a concern for sacral insufficiency fracture then CT or MRI may provide more sensitive assessment of this area.   Original Report Authenticated By: Signa Kell, M.D.    Dg Hip 1 View Left  12/25/2012  *RADIOLOGY REPORT*  Clinical Data: Status post fall  LEFT HIP - 1 VIEW:a  Comparison: 8/11/six  Findings: The left hip appears located.  No evidence for dislocation.  There is been interval fracture of the left superior and inferior pubic rami compared with 06/20/2005.  Again noted are fractures involving the right superior and inferior pubic rami.  IMPRESSION:  1.  Acute fractures involve the left superior and inferior pubic rami. 2.  Chronic fractures involve the right pubic rami   Original Report Authenticated By: Signa Kell, M.D.     MEDICATIONS: Scheduled Meds: . amLODipine  2.5 mg Oral Daily  . aspirin EC  81 mg Oral Daily  . atorvastatin  40 mg Oral q1800  . docusate  sodium  100 mg Oral BID  . enoxaparin (LOVENOX) injection  40 mg Subcutaneous Q24H  . insulin aspart  0-5 Units Subcutaneous QHS  . insulin aspart  0-9 Units Subcutaneous TID WC  . metoprolol tartrate  12.5 mg Oral BID  . polyethylene glycol  17 g Oral Daily   Continuous Infusions:  PRN Meds:.acetaminophen, acetaminophen, HYDROcodone-acetaminophen, morphine injection, ondansetron (ZOFRAN) IV, ondansetron  Antibiotics: Anti-infectives   None       Jeoffrey Massed, MD  [facility] Pager:[phone]  If 7PM-7AM, please contact night-coverage [email or web address] Password [email or web address] 12/27/2012, 11:04 AM   LOS: 2 days

## 2012-12-27 NOTE — Progress Notes (Signed)
Patient ID: JAHYRA SUKUP, female   DOB: [birth year], 79 y.o.   MRN: [id number]     Subjective:  Patient reports pain as mild to moderate.  She follows all commands and is aware of place but not time or date.  Objective:   VITALS:   Filed Vitals:   12/26/12 1429 12/26/12 2119 12/27/12 0201 12/27/12 0600  BP: 109/60 138/56 133/72 153/68  Pulse: 74 67 64 71  Temp: 97.6 F (36.4 C) 97.7 F (36.5 C) 97.6 F (36.4 C) 97.8 F (36.6 C)  TempSrc: Oral Oral Oral Oral  Resp: 18 18 18 18   Height:      Weight:      SpO2: 93% 94% 90% 91%    ABD soft Sensation intact distally Dorsiflexion/Plantar flexion intact   Lab Results  Component Value Date   WBC 7.7 12/26/2012   HGB 11.4* 12/26/2012   HCT 36.2 12/26/2012   MCV 81.2 12/26/2012   PLT 185 12/26/2012     Assessment/Plan:     Active Problems:   Pubic bone fracture   Fall   Hypertension   Hyperglycemia   Advance diet Up with therapy Continue plan per medicine WBAT Ok to DC form Ortho standpoint  Follow up in two weeks in Dr Darla Lesches office   DOUGLAS Janace Litten 12/27/2012, 8:10 AM   Teryl Lucy, MD Cell [phone] Pager [phone]

## 2012-12-27 NOTE — Progress Notes (Signed)
Physical Therapy Treatment Patient Details Name: Savannah [name] MRN: [id number] DOB: 10-25-1919 Today's Date: 12/27/2012 Time: 7829-5621 PT Time Calculation (min): 25 min  PT Assessment / Plan / Recommendation Comments on Treatment Session  Pt adm with lt pelvic fx.  Pt making slow, steady progress.    Follow Up Recommendations  SNF     Does the patient have the potential to tolerate intense rehabilitation     Barriers to Discharge        Equipment Recommendations  None recommended by PT    Recommendations for Other Services    Frequency Min 3X/week   Plan Discharge plan remains appropriate;Frequency remains appropriate    Precautions / Restrictions Precautions Precautions: Fall Restrictions LLE Weight Bearing: Weight bearing as tolerated   Pertinent Vitals/Pain Pain in lt pelvis 8-9/10 with mobility.  Repositioned and pain significantly lower.    Mobility  Bed Mobility Bed Mobility: Supine to Sit Supine to Sit: 1: +2 Total assist Supine to Sit: Patient Percentage: 10% Details for Bed Mobility Assistance: Assist to move legs and to bring trunk up. Transfers Transfers: Sit to Stand;Stand to Sit;Stand Pivot Transfers Sit to Stand: 1: +2 Total assist;With upper extremity assist;From bed Sit to Stand: Patient Percentage: 50% (using Stedy) Stand to Sit: 1: +2 Total assist Stand to Sit: Patient Percentage: 50% Stand Pivot Transfers: 1: +2 Total assist Transfer via Lift Equipment: Stedy Details for Transfer Assistance: Unable to fully stand pt using walker with +2 total (pt = 30%).  Pt able to achieve full standing using Stedy and able to transfer to chair with Stedy.    Exercises     PT Diagnosis:    PT Problem List:   PT Treatment Interventions:     PT Goals Acute Rehab PT Goals PT Goal: Sit to Stand - Progress: Progressing toward goal PT Transfer Goal: Bed to Chair/Chair to Bed - Progress: Progressing toward goal PT Goal: Ambulate - Progress: Progressing toward  goal  Visit Information  Last PT Received On: 12/27/12 Assistance Needed: +2 PT/OT Co-Evaluation/Treatment: Yes    Subjective Data  Subjective: "Wait a minute, wait a minute," pt stated about standing.   Cognition  Cognition Overall Cognitive Status: Impaired Area of Impairment: Memory;Awareness of errors;Awareness of deficits Arousal/Alertness: Awake/alert Behavior During Session: [facility] for tasks performed Memory Deficits: asked same questions repeatedly Awareness of Errors: Assistance required to identify errors made;Assistance required to correct errors made    Balance  Balance Balance Assessed: Yes Static Sitting Balance Static Sitting - Balance Support: Bilateral upper extremity supported;Feet supported Static Sitting - Level of Assistance:  (Min guard A) Static Standing Balance Static Standing - Balance Support: Bilateral upper extremity supported Static Standing - Level of Assistance: 1: +2 Total assist Static Standing - Comment/# of Minutes: Pt stood with Stedy x 30 sec with +2 total (pt=60%)  End of Session PT - End of Session Equipment Utilized During Treatment: Gait belt;Oxygen Activity Tolerance: Patient limited by fatigue Patient left: in chair;with call bell/phone within reach Nurse Communication: Mobility status;Need for lift equipment   GP     Jaloni Davoli 12/27/2012, 12:19 PM  [facility] PT [phone]

## 2012-12-28 LAB — GLUCOSE, CAPILLARY

## 2012-12-28 MED ORDER — POLYETHYLENE GLYCOL 3350 17 G PO PACK: 17.0000 g | PACK | Freq: Every day | ORAL | Status: DC

## 2012-12-28 MED ORDER — METFORMIN HCL 500 MG PO TABS: 500.0000 mg | ORAL_TABLET | Freq: Every day | ORAL | Status: DC

## 2012-12-28 MED ORDER — HYDROCODONE-ACETAMINOPHEN 5-325 MG PO TABS: 1.0000 | ORAL_TABLET | Freq: Four times a day (QID) | ORAL | Status: DC | PRN

## 2012-12-28 MED ORDER — DSS 100 MG PO CAPS: 100.0000 mg | ORAL_CAPSULE | Freq: Two times a day (BID) | ORAL | Status: DC

## 2012-12-28 NOTE — Discharge Summary (Signed)
Physician Discharge Summary  Savannah [name]:[id number] DOB: Mar 28, 1919 DOA: 12/25/2012  PCP: No primary provider on file.  Admit date: 12/25/2012 Discharge date: 12/28/2012  Time spent: 40 minutes  Recommendations for Outpatient Follow-up:  See Dr. Dion Saucier in two weeks for follow up to pelvic fracture. Physical therapy - weight bearing as tolerated.  Discharge Diagnoses:  Active Problems:   Pubic bone fracture   Fall   Hypertension   Hyperglycemia   Discharge Condition: stable  Diet recommendation: carb modified  Filed Weights   12/26/12 0109  Weight: 76.34 kg (168 lb 4.8 oz)    History of present illness:  Savannah [name] is a 79 y.o. female who has a past medical history of Hypertension; Hypercholesteremia; Pelvis fracture; and Kidney stones.   She presented with a mechanical fall today while using her walker. Denies LOS no head injury. Family thinks she tripped on her pants. Hip films found left pubic rami fracture. Dr. Dion Saucier is aware and recommends PT/OT and pain management. She have had history of pelvic fracture in the past in 2005. Of note she was found to be mildly hyperglycemic and needs to have father evaluation for diabetes.   Hospital Course:   Pubic bone fracture-left  After a mechanical fall  Physical therapy at SNF before returning to her own home.  Fall  Mechanical fall  No history of syncope  Evaluated by PT who recommends SNF physical therapy  Hypertension  Continue with amlodipine and metoprolol -BP controlled   Hyperglycemia  Continue with SSI and carb modified diet A1c 6.8-started metformin this hospitalization.   Dyslipidemia  Continue with statin  Discharge Exam: Filed Vitals:   12/27/12 1450 12/27/12 2110 12/28/12 0540 12/28/12 0619  BP: 118/69 149/69 170/67 155/78  Pulse: 64 70 80   Temp: 97.9 F (36.6 C) 98.1 F (36.7 C) 97.9 F (36.6 C)   TempSrc: Oral Oral Oral   Resp: 18 16 16    Height:      Weight:      SpO2: 96% 92% 94%      General: Awake, Alert, Comfortable lying in bed Cardiovascular: rrr no m/r/g Respiratory: cta no w/c/r Abdomen:  Soft, nd, nt, +bs, no masses Extremities: no swelling in lower extremities   Discharge Instructions  Discharge Orders   Future Orders Complete By Expires     Diet Carb Modified  As directed     Increase activity slowly  As directed     Other Restrictions  As directed     Comments:      Weight bearing as tolerated        Medication List    TAKE these medications       amLODipine 2.5 MG tablet  Commonly known as:  NORVASC  Take 2.5 mg by mouth daily.     aspirin EC 81 MG tablet  Take 81 mg by mouth daily.     calcium-vitamin D 500-200 MG-UNIT per tablet  Commonly known as:  OSCAL WITH D  Take 1 tablet by mouth daily.     DSS 100 MG Caps  Take 100 mg by mouth 2 (two) times daily.     HYDROcodone-acetaminophen 5-325 MG per tablet  Commonly known as:  NORCO/VICODIN  Take 1 tablet by mouth every 6 (six) hours as needed.     metFORMIN 500 MG tablet  Commonly known as:  GLUCOPHAGE  Take 1 tablet (500 mg total) by mouth daily with breakfast.     metoprolol tartrate 25 MG tablet  Commonly known as:  LOPRESSOR  Take 12.5 mg by mouth 2 (two) times daily.     polyethylene glycol packet  Commonly known as:  MIRALAX / GLYCOLAX  Take 17 g by mouth daily.     risedronate 150 MG tablet  Commonly known as:  ACTONEL  Take 150 mg by mouth every 30 (thirty) days. with water on empty stomach, nothing by mouth or lie down for next 30 minutes. Take the 1st of every month     rosuvastatin 20 MG tablet  Commonly known as:  CRESTOR  Take 20 mg by mouth at bedtime.     Vitamin D-3 1000 UNITS Caps  Take 1,000 Units by mouth daily.           Follow-up Information   Follow up with Eulas Post, MD. Schedule an appointment as soon as possible for a visit in 2 weeks.   Contact information:   [address] [place] [address] [phone]         The results of significant diagnostics from this hospitalization (including imaging, microbiology, ancillary and laboratory) are listed below for reference.    Significant Diagnostic Studies: Dg Lumbar Spine 2-3 Views  12/25/2012  *RADIOLOGY REPORT*  Clinical Data: Status post fall  LUMBAR SPINE - 2-3 VIEW  Comparison: None.  Findings:  There is a compression deformity involving the T11 vertebra, which is unchanged from 05/20/2009.  There are no new fracture deformities identified.  There is a first-degree anterolisthesis of L4 on L5 measuring 7 mm. Multilevel disc space narrowing and ventral spurring is noted.  Degenerative disc disease is most severe at the L5 S1 level.  Calcified atherosclerotic disease affects the abdominal aorta and its branches.  IMPRESSION:  1.  Lumbar spondylosis. 2.  Stable T11 compression fracture.   Original Report Authenticated By: Signa Kell, M.D.    Dg Pelvis 1-2 Views  12/25/2012  *RADIOLOGY REPORT*  Clinical Data: Status post fall  PELVIS - 1-2 VIEW  Comparison: 06/20/2005  Findings: Bones are diffusely osteopenic and there is bowel gas overlying the sacrum.There are chronic fracture deformities involving the superior and inferior right pubic rami. Cortical irregularity involving the left pubic rami noted which is suspicious for fracture in this area.  Both hips appear located. No evidence for hip fracture.  IMPRESSION:  1.  Chronic fractures involve the right superior inferior pubic rami. 2.  Suspect interval fracture of the left pubic rami. 3.  Osteopenia and overlying bowel gas diminishes sensitivity for detecting sacral insufficiency fracture.  If there is a concern for sacral insufficiency fracture then CT or MRI may provide more sensitive assessment of this area.   Original Report Authenticated By: Signa Kell, M.D.    Dg Hip 1 View Left  12/25/2012  *RADIOLOGY REPORT*  Clinical Data: Status post fall  LEFT HIP - 1 VIEW:a  Comparison: 8/11/six   Findings: The left hip appears located.  No evidence for dislocation.  There is been interval fracture of the left superior and inferior pubic rami compared with 06/20/2005.  Again noted are fractures involving the right superior and inferior pubic rami.  IMPRESSION:  1.  Acute fractures involve the left superior and inferior pubic rami. 2.  Chronic fractures involve the right pubic rami   Original Report Authenticated By: Signa Kell, M.D.     Labs: Basic Metabolic Panel:  Recent Labs Lab 12/25/12 2330 12/26/12 0541  NA 137 143  K 4.7 4.1  CL 101 104  CO2  32 28  GLUCOSE 237* 101*  BUN 23 24*  CREATININE 0.82 0.81  CALCIUM 9.0 8.5  MG  --  2.1  PHOS  --  3.8   Liver Function Tests:  Recent Labs Lab 12/26/12 0541  AST 14  ALT 9  ALKPHOS 48  BILITOT 0.6  PROT 5.6*  ALBUMIN 2.8*   CBC:  Recent Labs Lab 12/25/12 2330 12/26/12 0541  WBC 11.8* 7.7  NEUTROABS 10.2*  --   HGB 12.3 11.4*  HCT 39.2 36.2  MCV 81.2 81.2  PLT 184 185   CBG:  Recent Labs Lab 12/27/12 0754 12/27/12 1206 12/27/12 1728 12/27/12 2217 12/28/12 0749  GLUCAP 82 105* 100* 90 86    SignedConley Canal [phone]  [facility] 12/28/2012, 9:58 AM

## 2012-12-28 NOTE — Clinical Social Work Placement (Addendum)
Clinical Social Work Department CLINICAL SOCIAL WORK PLACEMENT NOTE 12/28/2012  Patient:  Savannah [name], Savannah [name]  Account Number:  [id number] Admit date:  12/25/2012  Clinical Social Worker:  Johnsie Cancel  Date/time:  12/27/2012 10:18 AM  Clinical Social Work is seeking post-discharge placement for this patient at the following level of care:   SKILLED NURSING   (*CSW will update this form in Epic as items are completed)   12/27/2012  Patient/family provided with [facility] Department of Clinical Social Work's list of facilities offering this level of care within the geographic area requested by the patient (or if unable, by the patient's family).  12/27/2012  Patient/family informed of their freedom to choose among providers that offer the needed level of care, that participate in Medicare, Medicaid or managed care program needed by the patient, have an available bed and are willing to accept the patient.  12/27/2012  Patient/family informed of [facility]' ownership interest in [facility], as well as of the fact that they are under no obligation to receive care at this facility.  PASARR submitted to EDS on 12/28/2012 PASARR number received from EDS on 12/28/2012  FL2 transmitted to all facilities in geographic area requested by pt/family on  12/28/2012 FL2 transmitted to all facilities within larger geographic area on N/A  Patient informed that his/her managed care company has contracts with or will negotiate with  certain facilities, including the following:     Patient/family informed of bed offers received:  12/28/2012 Patient chooses bed at [facility] Physician recommends and patient chooses bed at  N/A  Patient to be transferred to [facility] on 12/28/2012 Patient to be transferred to facility by [facility]  The following physician request were entered in Epic:   Additional Comments:  Lia Foyer, LCSWA [facility] Clinical Social  Worker Contact #: [phone]

## 2012-12-28 NOTE — Discharge Summary (Signed)
Patient seen and examined, agree with the assessment and plan as outlined above. Savannah [name] is doing well, pain seems to adequately controlled, she is stable to be discharged to SNF today.  S Tirsa Gail

## 2012-12-28 NOTE — Clinical Social Work Psychosocial (Signed)
Clinical Social Work Department BRIEF PSYCHOSOCIAL ASSESSMENT 12/28/2012  Patient:  Savannah [name], Savannah [name]     Account Number:  [id number]     Admit date:  12/25/2012  Clinical Social Worker:  Johnsie Cancel  Date/Time:  12/27/2012 10:08 AM  Referred by:  Physician  Date Referred:  12/27/2012 Referred for  SNF Placement   Other Referral:   Interview type:  Patient Other interview type:    PSYCHOSOCIAL DATA Living Status:  ALONE Primary support name:  Demaris Bousquet Primary support relationship to patient:  CHILD, ADULT Degree of support available:   Adequate, participated in assessment.    CURRENT CONCERNS Current Concerns  Post-Acute Placement   Other Concerns:    SOCIAL WORK ASSESSMENT / PLAN CSW received consult re: SNF placement. PT completed evaluation and recommended SNF. CSW met with patient at bedside to discuss SNF process. CSW provided support on hospital admission. CSW informed patient and daughter she would start a [address] and contact [facility] (sister is at this SNF) & [facility] individually since these are the patient's top choices. CSW will continue to follow.   Assessment/plan status:  Information/Referral to [facility] Other assessment/ plan:   Information/referral to community resources:    PATIENT'S/FAMILY'S RESPONSE TO PLAN OF CARE: Patient and patient's daughter thanked CSW for assisting in d/c planning and providing support.    Lia Foyer, LCSWA [facility] Clinical Social Worker Contact #: [phone]

## 2012-12-28 NOTE — Progress Notes (Signed)
Savannah [name] to be D/C'd Skilled nursing facility per MD order.  Discussed with the patient and all questions fully answered.    Medication List    TAKE these medications       amLODipine 2.5 MG tablet  Commonly known as:  NORVASC  Take 2.5 mg by mouth daily.     aspirin EC 81 MG tablet  Take 81 mg by mouth daily.     calcium-vitamin D 500-200 MG-UNIT per tablet  Commonly known as:  OSCAL WITH D  Take 1 tablet by mouth daily.     DSS 100 MG Caps  Take 100 mg by mouth 2 (two) times daily.     HYDROcodone-acetaminophen 5-325 MG per tablet  Commonly known as:  NORCO/VICODIN  Take 1 tablet by mouth every 6 (six) hours as needed.     metFORMIN 500 MG tablet  Commonly known as:  GLUCOPHAGE  Take 1 tablet (500 mg total) by mouth daily with breakfast.     metoprolol tartrate 25 MG tablet  Commonly known as:  LOPRESSOR  Take 12.5 mg by mouth 2 (two) times daily.     polyethylene glycol packet  Commonly known as:  MIRALAX / GLYCOLAX  Take 17 g by mouth daily.     risedronate 150 MG tablet  Commonly known as:  ACTONEL  Take 150 mg by mouth every 30 (thirty) days. with water on empty stomach, nothing by mouth or lie down for next 30 minutes. Take the 1st of every month     rosuvastatin 20 MG tablet  Commonly known as:  CRESTOR  Take 20 mg by mouth at bedtime.     Vitamin D-3 1000 UNITS Caps  Take 1,000 Units by mouth daily.        VVS, Skin clean, dry and intact without evidence of skin break down, no evidence of skin tears noted. IV catheter discontinued intact. Site without signs and symptoms of complications. Dressing and pressure applied.  An After Visit Summary was printed and given to EMS to give to [facility]. Report given to nurse at [facility]. Patient escorted on stretcher by [facility]  To SNF Cindra Eves, RN 12/28/2012 2:50 PM

## 2012-12-28 NOTE — Clinical Social Work Note (Signed)
CSW was consulted to complete discharge of patient. Pt to transfer to [facility] today via [facility]. Facility and family are aware of d/c. D/C packet complete with chart copy, signed FL2, and signed hard Rx.  CSW signing off as no other CSW needs identified at this time.  Gilmore List, LCSWA [facility] Clinical Social Worker Contact #: [phone]  

## 2013-01-04 ENCOUNTER — Emergency Department (HOSPITAL_COMMUNITY): Payer: Medicare Other

## 2013-01-04 ENCOUNTER — Inpatient Hospital Stay (HOSPITAL_COMMUNITY)
Admission: EM | Admit: 2013-01-04 | Discharge: 2013-01-07 | DRG: 896 | Disposition: A | Discharge status: Skilled Nursing Facility | Payer: Medicare Other | Attending: Internal Medicine | Admitting: Internal Medicine

## 2013-01-04 ENCOUNTER — Encounter (HOSPITAL_COMMUNITY): Payer: Self-pay | Admitting: *Deleted

## 2013-01-04 DIAGNOSIS — T391X5A Adverse effect of 4-Aminophenol derivatives, initial encounter: Secondary | ICD-10-CM | POA: Diagnosis present

## 2013-01-04 DIAGNOSIS — J9620 Acute and chronic respiratory failure, unspecified whether with hypoxia or hypercapnia: Secondary | ICD-10-CM | POA: Diagnosis present

## 2013-01-04 DIAGNOSIS — I10 Essential (primary) hypertension: Secondary | ICD-10-CM | POA: Diagnosis present

## 2013-01-04 DIAGNOSIS — R251 Tremor, unspecified: Secondary | ICD-10-CM | POA: Diagnosis present

## 2013-01-04 DIAGNOSIS — R410 Disorientation, unspecified: Secondary | ICD-10-CM | POA: Diagnosis present

## 2013-01-04 DIAGNOSIS — E7800 Pure hypercholesterolemia, unspecified: Secondary | ICD-10-CM | POA: Diagnosis present

## 2013-01-04 DIAGNOSIS — J984 Other disorders of lung: Secondary | ICD-10-CM | POA: Diagnosis present

## 2013-01-04 DIAGNOSIS — E119 Type 2 diabetes mellitus without complications: Secondary | ICD-10-CM | POA: Diagnosis present

## 2013-01-04 DIAGNOSIS — R259 Unspecified abnormal involuntary movements: Secondary | ICD-10-CM

## 2013-01-04 DIAGNOSIS — S32509A Unspecified fracture of unspecified pubis, initial encounter for closed fracture: Secondary | ICD-10-CM

## 2013-01-04 DIAGNOSIS — E669 Obesity, unspecified: Secondary | ICD-10-CM | POA: Diagnosis present

## 2013-01-04 DIAGNOSIS — J9601 Acute respiratory failure with hypoxia: Secondary | ICD-10-CM | POA: Diagnosis present

## 2013-01-04 DIAGNOSIS — Z79899 Other long term (current) drug therapy: Secondary | ICD-10-CM

## 2013-01-04 DIAGNOSIS — K5900 Constipation, unspecified: Secondary | ICD-10-CM | POA: Diagnosis present

## 2013-01-04 DIAGNOSIS — Z66 Do not resuscitate: Secondary | ICD-10-CM | POA: Diagnosis present

## 2013-01-04 DIAGNOSIS — K449 Diaphragmatic hernia without obstruction or gangrene: Secondary | ICD-10-CM | POA: Diagnosis present

## 2013-01-04 DIAGNOSIS — F19921 Other psychoactive substance use, unspecified with intoxication with delirium: Principal | ICD-10-CM | POA: Diagnosis present

## 2013-01-04 DIAGNOSIS — R5381 Other malaise: Secondary | ICD-10-CM | POA: Diagnosis present

## 2013-01-04 DIAGNOSIS — E860 Dehydration: Secondary | ICD-10-CM | POA: Diagnosis present

## 2013-01-04 DIAGNOSIS — Z8673 Personal history of transient ischemic attack (TIA), and cerebral infarction without residual deficits: Secondary | ICD-10-CM

## 2013-01-04 DIAGNOSIS — R739 Hyperglycemia, unspecified: Secondary | ICD-10-CM

## 2013-01-04 DIAGNOSIS — Z7982 Long term (current) use of aspirin: Secondary | ICD-10-CM

## 2013-01-04 HISTORY — DX: Hyperglycemia, unspecified: R73.9

## 2013-01-04 LAB — COMPREHENSIVE METABOLIC PANEL
ALT: 16 U/L (ref 0–35)
Alkaline Phosphatase: 63 U/L (ref 39–117)
CO2: 31 mEq/L (ref 19–32)
GFR calc Af Amer: 82 mL/min — ABNORMAL LOW (ref >90)
GFR calc non Af Amer: 71 mL/min — ABNORMAL LOW (ref >90)
Glucose, Bld: 102 mg/dL — ABNORMAL HIGH (ref 70–99)
Potassium: 4.5 mEq/L (ref 3.5–5.1)
Sodium: 138 mEq/L (ref 135–145)

## 2013-01-04 LAB — LACTIC ACID, PLASMA: Lactic Acid, Venous: 0.9 mmol/L (ref 0.5–2.2)

## 2013-01-04 LAB — URINALYSIS, ROUTINE W REFLEX MICROSCOPIC
Bilirubin Urine: NEGATIVE
Ketones, ur: NEGATIVE mg/dL
Nitrite: NEGATIVE
Urobilinogen, UA: 2 mg/dL — ABNORMAL HIGH (ref 0.0–1.0)
pH: 6 (ref 5.0–8.0)

## 2013-01-04 LAB — GRAM STAIN

## 2013-01-04 LAB — CBC
Hemoglobin: 10.6 g/dL — ABNORMAL LOW (ref 12.0–15.0)
RBC: 4.12 MIL/uL (ref 3.87–5.11)
WBC: 9.4 10*3/uL (ref 4.0–10.5)

## 2013-01-04 LAB — GLUCOSE, CAPILLARY

## 2013-01-04 MED ORDER — PIPERACILLIN-TAZOBACTAM 3.375 G IVPB
3.3750 g | Freq: Three times a day (TID) | INTRAVENOUS | Status: DC
  Administered 2013-01-05 – 2013-01-06 (×4): 3.375 g via INTRAVENOUS
  Filled 2013-01-04 (×5): qty 50

## 2013-01-04 MED ORDER — PIPERACILLIN-TAZOBACTAM 3.375 G IVPB 30 MIN
3.3750 g | Freq: Once | INTRAVENOUS | Status: AC
  Administered 2013-01-04: 3.375 g via INTRAVENOUS
  Filled 2013-01-04: qty 50

## 2013-01-04 MED ORDER — LORAZEPAM 2 MG/ML IJ SOLN
1.0000 mg | INTRAMUSCULAR | Status: DC | PRN
  Administered 2013-01-04: 1 mg via INTRAVENOUS
  Filled 2013-01-04: qty 1

## 2013-01-04 NOTE — ED Notes (Signed)
Attempted to call report to floor.  Was told RN would return my call 

## 2013-01-04 NOTE — ED Provider Notes (Signed)
History     CSN: [id number]  Arrival date & time 01/04/13  1654   First MD Initiated Contact with Patient 01/04/13 1655      No chief complaint on file.  HPI Savannah [name] is a 79 y.o. female who was brought in to the ED by EMS and is accompanied by daughter for concern of AMS.  Patient was seen here last week after a mechanical ground level fall and was diagnosed with a pelvic ramus fracture.  Orthopedics consulted and fracture nonoperative.  Patient no longer able to ambulate unassisted.  Admission sought and after three days patient placed in SNF for rehab.  Over last three days at SNF, patient no longer eating on her own and is no longer making words.  LUE also appears to be not working as well and she has been having whole body jerks.  No history of stroke.  No fevers.  No cough.  No other symptoms.  Past Medical History  Diagnosis Date  . Hypertension   . Hypercholesteremia   . Pelvis fracture   . Kidney stones     Past Surgical History  Procedure Laterality Date  . Abdominal hysterectomy      Family History  Problem Relation Age of Onset  . Heart disease Mother   . Heart disease Father   . Diabetes Sister   . Stroke Brother     History  Substance Use Topics  . Smoking status: Former Games developer  . Smokeless tobacco: Not on file  . Alcohol Use: No    OB History   Grav Para Term Preterm Abortions TAB SAB Ect Mult Living                  Review of Systems  Unable to perform ROS: Dementia    Allergies  Review of patient's allergies indicates no known allergies.  Home Medications   Current Outpatient Rx  Name  Route  Sig  Dispense  Refill  . amLODipine (NORVASC) 2.5 MG tablet   Oral   Take 2.5 mg by mouth daily.         Marland Kitchen aspirin EC 81 MG tablet   Oral   Take 81 mg by mouth daily.         . calcium-vitamin D (OSCAL WITH D) 500-200 MG-UNIT per tablet   Oral   Take 1 tablet by mouth daily.         . Cholecalciferol (VITAMIN D-3) 1000 UNITS CAPS  Oral   Take 1,000 Units by mouth daily.         Marland Kitchen docusate sodium 100 MG CAPS   Oral   Take 100 mg by mouth 2 (two) times daily.   10 capsule   0   . HYDROcodone-acetaminophen (NORCO/VICODIN) 5-325 MG per tablet   Oral   Take 1 tablet by mouth every 6 (six) hours as needed.   30 tablet   0   . metFORMIN (GLUCOPHAGE) 500 MG tablet   Oral   Take 1 tablet (500 mg total) by mouth daily with breakfast.   30 tablet   0   . metoprolol tartrate (LOPRESSOR) 25 MG tablet   Oral   Take 12.5 mg by mouth 2 (two) times daily.         . polyethylene glycol (MIRALAX / GLYCOLAX) packet   Oral   Take 17 g by mouth daily.   14 each   0   . risedronate (ACTONEL) 150 MG tablet   Oral  Take 150 mg by mouth every 30 (thirty) days. with water on empty stomach, nothing by mouth or lie down for next 30 minutes. Take the 1st of every month         . rosuvastatin (CRESTOR) 20 MG tablet   Oral   Take 20 mg by mouth at bedtime.           There were no vitals taken for this visit.  Physical Exam  Nursing note and vitals reviewed. Constitutional: She appears well-developed and well-nourished. No distress.  HENT:  Head: Normocephalic and atraumatic.  Right Ear: External ear normal.  Left Ear: External ear normal.  Nose: Nose normal.  Mouth/Throat: Oropharynx is clear and moist. No oropharyngeal exudate.  Eyes: EOM are normal. Pupils are equal, round, and reactive to light.  Neck: Normal range of motion. Neck supple. No tracheal deviation present.  Cardiovascular: Normal rate.   Pulmonary/Chest: Effort normal and breath sounds normal. No stridor. No respiratory distress. She has no wheezes. She has no rales.  Abdominal: Soft. She exhibits no distension. There is no tenderness. There is no rebound.  Musculoskeletal: Normal range of motion.  Neurological: She is alert. GCS eye subscore is 4. GCS verbal subscore is 3. GCS motor subscore is 6.  Unable to fully assess given patient not  following commands.  Skin: Skin is warm and dry. She is not diaphoretic.    ED Course  Procedures (including critical care time)  Labs Reviewed  GRAM STAIN  URINE CULTURE  GLUCOSE, CAPILLARY  CBC  COMPREHENSIVE METABOLIC PANEL  URINALYSIS, ROUTINE W REFLEX MICROSCOPIC   Ct Head Wo Contrast  01/04/2013  *RADIOLOGY REPORT*  Clinical Data: Aphasia and right upper extremity weakness  CT HEAD WITHOUT CONTRAST  Technique:  Contiguous axial images were obtained from the base of the skull through the vertex without contrast.  Comparison: 05/18/2009  Findings: Stable small lacunar infarct of the left basal ganglia. The brain demonstrates no evidence of hemorrhage, acute infarction, edema, mass effect, extra-axial fluid collection, hydrocephalus or mass lesion.  The skull is unremarkable.  IMPRESSION: No acute findings.  Stable old lacunar infarct of the left basal ganglia.   Original Report Authenticated By: Irish Lack, M.D.    Dg Chest Portable 1 View  01/04/2013  *RADIOLOGY REPORT*  Clinical Data: Altered mental status.  PORTABLE CHEST - 1 VIEW  Comparison: 05/23/2009  Findings: Chronic-appearing atelectatic changes are seen at both lung bases.  The heart size is stable.  There is stable retrocardiac density consistent with known hiatal hernia.  No overt infiltrate or pulmonary edema is identified.  No significant pleural fluid is seen.  IMPRESSION: Chronic atelectatic changes of both lung bases and stable large hiatal hernia.   Original Report Authenticated By: Irish Lack, M.D.     Date: 01/05/2013  Rate: 70  Rhythm: normal sinus rhythm  QRS Axis: normal  Intervals: normal  ST/T Wave abnormalities: normal  Conduction Disutrbances:none  Narrative Interpretation: Poor baseline. Otherwise normal EKG.  Old EKG Reviewed: none available    1. Delirium   2. Acute respiratory failure with hypoxia   3. Dehydration   4. History of pelvic fracture   5. Hypertension   6. Shakes     MDM   Savannah [name] is a 79 y.o. female who presents to the ED with acute delirium and aphasia.  Patient normally alert and oriented.  Onset three days ago.  Question decrease of RUE but unsure given patient history of R shoulder pain and  not quite following commands.  Workup done and largely negative.  No indication for acute code stroke.  CT head negative.  UA and blood work negative.  CXR negative for pneumonia.  Given degree of delirium, medicine consulted for admission.  Patient stable for admission.  Patient admitted.    Arloa Koh, MD 01/05/13 [phone]

## 2013-01-04 NOTE — ED Notes (Signed)
Pt family stated that pt would like something to drink.  Per MD approval, pt provided something to drink following PT passing Stroke Swallow Screen.  Family made aware to notify staff of any trouble swallowing

## 2013-01-04 NOTE — ED Notes (Signed)
Pt is from [facility] and was recently here with pelvic fracture and sent back on oxygen.  For the last three days it is reported a change in mental status involving intermittent speech, tremors.  Pt usually talks in complete sentences.  Pt is warm to touch and has rhonchi bilaterally.  Original 02 sat was 85 % and now is 98%/2L

## 2013-01-04 NOTE — Progress Notes (Signed)
ANTIBIOTIC CONSULT NOTE - INITIAL  Pharmacy Consult for zosyn Indication: rule out pneumonia  No Known Allergies  Patient Measurements:   Body Weight: 76.3 kg  Vital Signs: Temp: 97.8 F (36.6 C) (02/25 2129) Temp src: Rectal (02/25 1716) BP: 92/43 mmHg (02/25 1930) Pulse Rate: 71 (02/25 1930) Intake/Output from previous day:   Intake/Output from this shift:    Labs:  Recent Labs  01/04/13 1713  WBC 9.4  HGB 10.6*  PLT 280  CREATININE 0.79   The CrCl is unknown because both a height and weight (above a minimum accepted value) are required for this calculation. No results found for this basename: VANCOTROUGH, VANCOPEAK, VANCORANDOM, GENTTROUGH, GENTPEAK, GENTRANDOM, TOBRATROUGH, TOBRAPEAK, TOBRARND, AMIKACINPEAK, AMIKACINTROU, AMIKACIN,  in the last 72 hours   Microbiology: Recent Results (from the past 720 hour(s))  GRAM STAIN     Status: None   Collection Time    01/04/13  6:16 PM      Result Value Range Status   Specimen Description URINE, RANDOM   Final   Special Requests NONE   Final   Gram Stain     Final   Value: CYTOSPIN SLIDE     SQUAMOUS EPITHELIAL CELLS PRESENT     NO WBC SEEN     NEGATIVE FOR BACTERIA   Report Status 01/04/2013 FINAL   Final    Medical History: Past Medical History  Diagnosis Date  . Hypertension   . Hypercholesteremia   . Pelvis fracture   . Kidney stones   . Hyperglycemia     Medications:  No antibiotics  Assessment: Mrs. Wainwright is a 79 yo f admitted with AMS.  She recently had a pelvic ramus fracture and has been at a SNF for rehab.  Pharmacy has been asked to dose zosyn for possible PNA.   Her WBC is 9.4, she is afebrile and her creat is normal.  Her CXR shows chronic atelectatic changes of both lung bases.   Goal of Therapy: eradication of PNA  Plan:  1. Zosyn 3.375 gm IV x1 dose over 30 minutes in the ED then zosyn 3.375 gm IV q8h each dose infused over 4 hours. 2. F/u renal function, culture data and clinical  course. Herby Abraham, Pharm.D. [phone] 01/04/2013 10:04 PM

## 2013-01-04 NOTE — ED Notes (Signed)
I gave the patient a warm blanket. 

## 2013-01-04 NOTE — ED Notes (Signed)
Pt coughing and family at the bedside report that she has been getting cough med at the nursing home.    She was admitted for a pelvis fracture feb 15 th.  She has beenat the nursing home for one week and family report also that she has not been doing well since then.  She recognizes voices of her family but lies with hee eyes closed.

## 2013-01-04 NOTE — ED Notes (Signed)
Hospitalist in room with pt and family. 

## 2013-01-04 NOTE — ED Notes (Signed)
ED provider in room with pt 

## 2013-01-04 NOTE — H&P (Signed)
Chief Complaint:  ams  HPI: 79 yo female who up until 2 weeks ago was living independently normal mental state.  Had a mechanical fall and suffered from pelvic fracture and placed in short term rehab.  3 children are with her now.  She initially at rehab (over a week ago) was doing well, participating in physical therapy but for the last 3 days has been delerius.  Very confused.  Not participating in PT and not feeding herself. They do not know of any n/v/d.  Or fevers.  Patient cannot provide much history.  Pt is delirius but can participate in simple commands and follow my verbal instruction.  She denies any pain anywhere.  No sob, cp.  Family has not really noticed any focal neuro deficits.  On arrival to ED found to be hypoxic of 85% on RA but EDP thinks this may be an error, unsure if so.  Family also reports for several days she has been having these "shaking spells" where she is very restless and her arms and legs tremor.  Also report she has not had much sleep in several days.  Review of Systems:  Positive and negative as per HPI otherwise all other systems are negative per family members unobtainable from pt  Past Medical History: Past Medical History  Diagnosis Date  . Hypertension   . Hypercholesteremia   . Pelvis fracture   . Kidney stones   . Hyperglycemia    Past Surgical History  Procedure Laterality Date  . Abdominal hysterectomy      Medications: Prior to Admission medications   Medication Sig Start Date End Date Taking? Authorizing Provider  alendronate (FOSAMAX) 70 MG tablet Take 70 mg by mouth every 7 (seven) days. Take with a full glass of water on an empty stomach.   Yes Historical Provider, MD  amLODipine (NORVASC) 2.5 MG tablet Take 2.5 mg by mouth daily.   Yes Historical Provider, MD  aspirin EC 81 MG tablet Take 81 mg by mouth daily.   Yes Historical Provider, MD  calcium-vitamin D (OSCAL WITH D) 500-200 MG-UNIT per tablet Take 1 tablet by mouth daily.   Yes  Historical Provider, MD  Cholecalciferol (VITAMIN D-3) 1000 UNITS CAPS Take 1,000 Units by mouth daily.   Yes Historical Provider, MD  Docusate Sodium (DSS) 100 MG CAPS Take 100 mg by mouth 2 (two) times daily. 12/28/12  Yes Tora Kindred York, PA  HYDROcodone-acetaminophen (NORCO/VICODIN) 5-325 MG per tablet Take 1 tablet by mouth every 6 (six) hours as needed for pain. 12/28/12  Yes Tora Kindred York, PA  metFORMIN (GLUCOPHAGE) 500 MG tablet Take 1 tablet (500 mg total) by mouth daily with breakfast. 12/28/12  Yes Tora Kindred York, PA  metoprolol tartrate (LOPRESSOR) 25 MG tablet Take 12.5 mg by mouth 2 (two) times daily.   Yes Historical Provider, MD  polyethylene glycol (MIRALAX / GLYCOLAX) packet Take 17 g by mouth daily. 12/28/12  Yes Tora Kindred York, PA  rosuvastatin (CRESTOR) 20 MG tablet Take 20 mg by mouth at bedtime.   Yes Historical Provider, MD    Allergies:  No Known Allergies  Social History: Neg as above  Family History: Family History  Problem Relation Age of Onset  . Heart disease Mother   . Heart disease Father   . Diabetes Sister   . Stroke Brother     Physical Exam: Filed Vitals:   01/04/13 1910 01/04/13 1911 01/04/13 1930 01/04/13 2129  BP: 153/74  92/43   Pulse:  70 71   Temp:    97.8 F (36.6 C)  TempSrc:      Resp:  21 20   SpO2:  95% 94%    BP 108/68  Pulse 74  Temp(Src) 97.8 F (36.6 C) (Rectal)  Resp 20  SpO2 94% General appearance: alert, cooperative and no distress  MMM dry. Head: Normocephalic, without obvious abnormality, atraumatic Eyes: negative Nose: Nares normal. Septum midline. Mucosa normal. No drainage or sinus tenderness. Throat: lips, mucosa, and tongue normal; teeth and gums normal Neck: no JVD and supple, symmetrical, trachea midline Back: symmetric, no curvature. ROM normal. No CVA tenderness. Lungs: clear to auscultation bilaterally Heart: regular rate and rhythm, S1, S2 normal, no murmur, click, rub or gallop Abdomen: soft,  non-tender; bowel sounds normal; no masses,  no organomegaly Extremities: extremities normal, atraumatic, no cyanosis or edema Pulses: 2+ and symmetric Skin: Skin color, texture, turgor normal. No rashes or lesions Neurologic: Mental status: delerius, can focus to follow commands.  knows her name and her children    Labs on Admission:   Recent Labs  01/04/13 1713  NA 138  K 4.5  CL 99  CO2 31  GLUCOSE 102*  BUN 24*  CREATININE 0.79  CALCIUM 8.9    Recent Labs  01/04/13 1713  AST 24  ALT 16  ALKPHOS 63  BILITOT 0.8  PROT 5.9*  ALBUMIN 2.6*    Recent Labs  01/04/13 1713  WBC 9.4  HGB 10.6*  HCT 33.8*  MCV 82.0  PLT 280    Radiological Exams on Admission: Ct Head Wo Contrast  01/04/2013  *RADIOLOGY REPORT*  Clinical Data: Aphasia and right upper extremity weakness  CT HEAD WITHOUT CONTRAST  Technique:  Contiguous axial images were obtained from the base of the skull through the vertex without contrast.  Comparison: 05/18/2009  Findings: Stable small lacunar infarct of the left basal ganglia. The brain demonstrates no evidence of hemorrhage, acute infarction, edema, mass effect, extra-axial fluid collection, hydrocephalus or mass lesion.  The skull is unremarkable.  IMPRESSION: No acute findings.  Stable old lacunar infarct of the left basal ganglia.   Original Report Authenticated By: Irish Lack, M.D.    Dg Chest Portable 1 View  01/04/2013  *RADIOLOGY REPORT*  Clinical Data: Altered mental status.  PORTABLE CHEST - 1 VIEW  Comparison: 05/23/2009  Findings: Chronic-appearing atelectatic changes are seen at both lung bases.  The heart size is stable.  There is stable retrocardiac density consistent with known hiatal hernia.  No overt infiltrate or pulmonary edema is identified.  No significant pleural fluid is seen.  IMPRESSION: Chronic atelectatic changes of both lung bases and stable large hiatal hernia.   Original Report Authenticated By: Irish Lack, M.D.      Assessment/Plan 79 yo female with dehydration, hypoxia, delirium and recent immobilization from pelvic fracture.  Principal Problem:   Delirium Active Problems:   Pubic bone fracture 1/14   Hypertension   Acute respiratory failure with hypoxia   Shakes   Dehydration  W/u neg thus far in ED.  Consider PE vs asp pna.  Will ck cta to r/o PE and place on zosyn to cover for developing pna.  Will ck mag level place on ivf with kcl replacement overnight.  Will also ck eeg in am.  Neuro cks overnigth.  If mental status does not improve with above order consider mri and further neurological w/u.  Will provide some ativan to help her rest hopefully.  Also the introduction of  pain medications could be causing her current delirium.  However probably multifactorial.  Pt is DNR per 3 children present.  Place in stepdown overnight due to severity of mental status changes.  Candiss Galeana A 01/04/2013, 9:39 PM

## 2013-01-04 NOTE — ED Notes (Signed)
Pt able to communicate in short simple phrases at this time.  Pt able to recognize nurse as a female and recite RN's name.  Pt family stating that this is a vast improvement in cognitive level from previous, and closer to pt's baseline.  Pt having some slurred speech which the family attributes to the patient not having her lower dentures in place.  MD made aware

## 2013-01-04 NOTE — ED Notes (Signed)
CBG was 99. Notified Nurse Thayer Ohm.

## 2013-01-05 ENCOUNTER — Inpatient Hospital Stay (HOSPITAL_COMMUNITY): Payer: Medicare Other

## 2013-01-05 ENCOUNTER — Encounter (HOSPITAL_COMMUNITY): Payer: Self-pay | Admitting: *Deleted

## 2013-01-05 DIAGNOSIS — E860 Dehydration: Secondary | ICD-10-CM

## 2013-01-05 DIAGNOSIS — Z8781 Personal history of (healed) traumatic fracture: Secondary | ICD-10-CM

## 2013-01-05 DIAGNOSIS — I359 Nonrheumatic aortic valve disorder, unspecified: Secondary | ICD-10-CM

## 2013-01-05 DIAGNOSIS — J9600 Acute respiratory failure, unspecified whether with hypoxia or hypercapnia: Secondary | ICD-10-CM

## 2013-01-05 LAB — CBC
Hemoglobin: 10.5 g/dL — ABNORMAL LOW (ref 12.0–15.0)
MCH: 25.7 pg — ABNORMAL LOW (ref 26.0–34.0)
MCV: 83.1 fL (ref 78.0–100.0)
Platelets: 267 10*3/uL (ref 150–400)
RBC: 4.08 MIL/uL (ref 3.87–5.11)
WBC: 6.1 10*3/uL (ref 4.0–10.5)

## 2013-01-05 LAB — BASIC METABOLIC PANEL
BUN: 20 mg/dL (ref 6–23)
Calcium: 8.4 mg/dL (ref 8.4–10.5)
Creatinine, Ser: 0.76 mg/dL (ref 0.50–1.10)
GFR calc Af Amer: 83 mL/min — ABNORMAL LOW (ref >90)
GFR calc non Af Amer: 72 mL/min — ABNORMAL LOW (ref >90)
Glucose, Bld: 94 mg/dL (ref 70–99)

## 2013-01-05 LAB — CREATININE, SERUM
Creatinine, Ser: 0.77 mg/dL (ref 0.50–1.10)
GFR calc Af Amer: 83 mL/min — ABNORMAL LOW (ref >90)

## 2013-01-05 LAB — PROTIME-INR: Prothrombin Time: 13.9 seconds (ref 11.6–15.2)

## 2013-01-05 LAB — TROPONIN I: Troponin I: <0.3 ng/mL (ref <0.30)

## 2013-01-05 LAB — TSH: TSH: 2.54 u[IU]/mL (ref 0.350–4.500)

## 2013-01-05 LAB — MAGNESIUM: Magnesium: 2.1 mg/dL (ref 1.5–2.5)

## 2013-01-05 LAB — URINE CULTURE

## 2013-01-05 MED ORDER — ASPIRIN EC 81 MG PO TBEC
81.0000 mg | DELAYED_RELEASE_TABLET | Freq: Every day | ORAL | Status: DC
  Administered 2013-01-05 – 2013-01-07 (×3): 81 mg via ORAL
  Filled 2013-01-05 (×4): qty 1

## 2013-01-05 MED ORDER — HEPARIN (PORCINE) IN NACL 100-0.45 UNIT/ML-% IJ SOLN
1050.0000 [IU]/h | INTRAMUSCULAR | Status: DC
  Administered 2013-01-05 (×2): 1150 [IU]/h via INTRAVENOUS
  Filled 2013-01-05 (×3): qty 250

## 2013-01-05 MED ORDER — IOHEXOL 350 MG/ML SOLN
60.0000 mL | Freq: Once | INTRAVENOUS | Status: AC | PRN
  Administered 2013-01-05: 60 mL via INTRAVENOUS

## 2013-01-05 MED ORDER — HEPARIN BOLUS VIA INFUSION
3000.0000 [IU] | Freq: Once | INTRAVENOUS | Status: AC
  Administered 2013-01-05: 3000 [IU] via INTRAVENOUS
  Filled 2013-01-05: qty 3000

## 2013-01-05 MED ORDER — SODIUM CHLORIDE 0.9 % IV SOLN
INTRAVENOUS | Status: AC
  Administered 2013-01-05 (×2): via INTRAVENOUS

## 2013-01-05 MED ORDER — ENOXAPARIN SODIUM 40 MG/0.4ML ~~LOC~~ SOLN
40.0000 mg | SUBCUTANEOUS | Status: DC
  Administered 2013-01-05: 40 mg via SUBCUTANEOUS
  Filled 2013-01-05: qty 0.4

## 2013-01-05 NOTE — ED Notes (Signed)
CALLED LAB AND THEY WILL ADD BMP ON TO PT COLLECTED SAMPLES

## 2013-01-05 NOTE — Progress Notes (Signed)
ANTICOAGULATION CONSULT NOTE - Initial Consult  Pharmacy Consult for heparin Indication: pulmonary embolus  No Known Allergies  Patient Measurements: Height: 5\' 3"  (160 cm) Weight: 175 lb 14.8 oz (79.8 kg) IBW/kg (Calculated) : 52.4   Vital Signs: Temp: 97.5 F (36.4 C) (02/26 1126) Temp src: Oral (02/26 1126) BP: 148/68 mmHg (02/26 1126) Pulse Rate: 72 (02/26 1126)  Labs:  Recent Labs  01/04/13 1713 01/05/13 0855  HGB 10.6* 10.5*  HCT 33.8* 33.9*  PLT 280 267  CREATININE 0.79 0.77  0.76  TROPONINI  --  <0.30    Estimated Creatinine Clearance: 45.8 ml/min (by C-G formula based on Cr of 0.77).   Medical History: Past Medical History  Diagnosis Date  . Hypertension   . Hypercholesteremia   . Pelvis fracture   . Kidney stones   . Hyperglycemia     Medications:  Prescriptions prior to admission  Medication Sig Dispense Refill  . alendronate (FOSAMAX) 70 MG tablet Take 70 mg by mouth every 7 (seven) days. Take with a full glass of water on an empty stomach.      Marland Kitchen amLODipine (NORVASC) 2.5 MG tablet Take 2.5 mg by mouth daily.      Marland Kitchen aspirin EC 81 MG tablet Take 81 mg by mouth daily.      . calcium-vitamin D (OSCAL WITH D) 500-200 MG-UNIT per tablet Take 1 tablet by mouth daily.      . Cholecalciferol (VITAMIN D-3) 1000 UNITS CAPS Take 1,000 Units by mouth daily.      Tery Sanfilippo Sodium (DSS) 100 MG CAPS Take 100 mg by mouth 2 (two) times daily.      Marland Kitchen HYDROcodone-acetaminophen (NORCO/VICODIN) 5-325 MG per tablet Take 1 tablet by mouth every 6 (six) hours as needed for pain.      . metFORMIN (GLUCOPHAGE) 500 MG tablet Take 1 tablet (500 mg total) by mouth daily with breakfast.  30 tablet  0  . metoprolol tartrate (LOPRESSOR) 25 MG tablet Take 12.5 mg by mouth 2 (two) times daily.      . polyethylene glycol (MIRALAX / GLYCOLAX) packet Take 17 g by mouth daily.  14 each  0  . rosuvastatin (CRESTOR) 20 MG tablet Take 20 mg by mouth at bedtime.         Assessment: Savannah [name] is a 79 yo F known to pharmacy from zosyn protocol to start heparin protocol for r/o PE.  CTA has been ordered but not done.  Her PMH is significant for pelvic ramus fx recently and DC to SNF for rehab.   She was given LMWH 40 mg this am at 1044 am. Her H/H is 10.5/33.9, her PLTC is 267 and her creat is 0.76. No bleeding reported.    Goal of Therapy:  Heparin level 0.3-0.7 units/ml Monitor platelets by anticoagulation protocol: Yes   Plan:  Give 3000 units bolus x 1 Start heparin infusion at 1150 units/hr Check anti-Xa level in 8 hours and daily while on heparin Continue to monitor H&H and platelets DC LMWH 40 q24 Order baseline INR with first HL Herby Abraham, Pharm.D. [phone] 01/05/2013 1:23 PM

## 2013-01-05 NOTE — Progress Notes (Signed)
Phone call was received from CT stating that the pt's IV site had infiltrated. RN elevated arm; applied ice, & removed iv.

## 2013-01-05 NOTE — Progress Notes (Signed)
  Echocardiogram 2D Echocardiogram has been performed.  Savannah [name] 01/05/2013, 1:36 PM

## 2013-01-05 NOTE — ED Notes (Signed)
eeg at bedside 

## 2013-01-05 NOTE — ED Notes (Signed)
SPOKE WITH DR Lavera Guise . HE WILL CHANGE PT FROM A STEP DOWN BED TO A TELEMETRY BED

## 2013-01-05 NOTE — Progress Notes (Signed)
[name] [id number] Admission Data: 01/05/2013 12:14 PM Attending Provider: Lorane Gell, MD  PCP:No primary provider on file. Consults/ Treatment Team:    Savannah [name] is a 79 y.o. female patient admitted from ED awake, alert  & orientated  X 3,  DNR, VSS - Blood pressure 148/68, pulse 72, temperature 97.5 F (36.4 C), temperature source Oral, resp. rate 28, height 5\' 3"  (1.6 m), weight 79.8 kg (175 lb 14.8 oz), SpO2 94.00%., O2    2 L nasal cannular, no c/o shortness of breath, no c/o chest pain, no distress noted. Tele # [id number] placed and pt is currently running:normal sinus rhythm.   IV site WDL:  wrist left, condition patent and no redness with a transparent dsg that's clean dry and intact.  Allergies:  No Known Allergies   Past Medical History  Diagnosis Date  . Hypertension   . Hypercholesteremia   . Pelvis fracture   . Kidney stones   . Hyperglycemia     History:  obtained from the patient. Tobacco/alcohol: denied none  Pt orientation to unit, room and routine. Information packet given to patient/family and safety video watched.  Admission INP armband ID verified with patient/family, and in place. SR up x 2, fall risk assessment complete with Patient and family verbalizing understanding of risks associated with falls. Pt verbalizes an understanding of how to use the call bell and to call for help before getting out of bed.  Skin, clean-dry- intact without evidence of bruising, or skin tears.   No evidence of skin break down noted on exam. no rashes, no ecchymoses    Will cont to monitor and assist as needed.  [name] Consuella Lose, RN 01/05/2013 12:14 PM

## 2013-01-05 NOTE — Progress Notes (Signed)
[facility] PROGRESS NOTE  Savannah [name] WUJ:[id number] DOB: 1919-08-15 DOA: 01/04/2013 PCP: No primary provider on file. 79 yo female who up until 2 weeks ago was living independently normal mental state. Had a mechanical fall and suffered from pelvic fracture and placed in short term rehab. 3 children are with her now. She initially at rehab (over a week ago) was doing well, participating in physical therapy but for the last 3 days has been delerius. Very confused. Not participating in PT and not feeding herself. They do not know of any n/v/d. Or fevers. Patient cannot provide much history. Pt is delirius but can participate in simple commands and follow my verbal instruction. She denies any pain anywhere. No sob, cp. Family has not really noticed any focal neuro deficits. On arrival to ED found to be hypoxic of 85% on RA but EDP thinks this may be an error, unsure if so. Family also reports for several days she has been having these "shaking spells" where she is very restless and her arms and legs tremor. Also report she has not had much sleep in several days.     Assessment/Plan: 1. Delirium:  - resolved. Etiology probably hypoxia and opiates.    2. Hypoxia: - etiology is still under investigation - ? Atelectases from opiate induced hypopnea, r/o PE (on Heparin drip empirically).  3. Recent pelvic fracture to resume PT  4. Old basal ganglia infarct 5. DM 6. HTN 7. HL  Code Status: DNR Family Communication: patient  Disposition Plan: snf    Consultants:  none  Procedures:  None    HPI/Subjective: Has no complains   Objective: Filed Vitals:   01/05/13 0700 01/05/13 1045 01/05/13 1126 01/05/13 1340  BP: 177/70 173/56 148/68 160/78  Pulse: 63 82 72 67  Temp:   97.5 F (36.4 C) 98.2 F (36.8 C)  TempSrc:   Oral Oral  Resp: 23 28  22   Height:   5\' 3"  (1.6 m)   Weight:   79.8 kg (175 lb 14.8 oz)   SpO2: 97% 92% 94% 95%    Intake/Output Summary (Last 24 hours) at  01/05/13 1440 Last data filed at 01/05/13 0751  Gross per 24 hour  Intake     50 ml  Output      0 ml  Net     50 ml   Filed Weights   01/05/13 1126  Weight: 79.8 kg (175 lb 14.8 oz)    Exam:   General:  Alert , oriented to self, not agitated   Cardiovascular: regular   Respiratory: ctab     Abdomen: soft, nt   Data Reviewed: Basic Metabolic Panel:  Recent Labs Lab 01/04/13 1713 01/05/13 0855  NA 138 140  K 4.5 4.0  CL 99 100  CO2 31 32  GLUCOSE 102* 94  BUN 24* 20  CREATININE 0.79 0.77  0.76  CALCIUM 8.9 8.4  MG  --  2.1   Liver Function Tests:  Recent Labs Lab 01/04/13 1713  AST 24  ALT 16  ALKPHOS 63  BILITOT 0.8  PROT 5.9*  ALBUMIN 2.6*   No results found for this basename: LIPASE, AMYLASE,  in the last 168 hours  Recent Labs Lab 01/05/13 0855  AMMONIA 15   CBC:  Recent Labs Lab 01/04/13 1713 01/05/13 0855  WBC 9.4 6.1  HGB 10.6* 10.5*  HCT 33.8* 33.9*  MCV 82.0 83.1  PLT 280 267   Cardiac Enzymes:  Recent Labs Lab 01/05/13 0855  TROPONINI <0.30   BNP (last 3 results) No results found for this basename: PROBNP,  in the last 8760 hours CBG:  Recent Labs Lab 01/04/13 1724  GLUCAP 99    Recent Results (from the past 240 hour(s))  GRAM STAIN     Status: None   Collection Time    01/04/13  6:16 PM      Result Value Range Status   Specimen Description URINE, RANDOM   Final   Special Requests NONE   Final   Gram Stain     Final   Value: CYTOSPIN SLIDE     SQUAMOUS EPITHELIAL CELLS PRESENT     NO WBC SEEN     NEGATIVE FOR BACTERIA   Report Status 01/04/2013 FINAL   Final     Studies: Ct Head Wo Contrast  01/04/2013  *RADIOLOGY REPORT*  Clinical Data: Aphasia and right upper extremity weakness  CT HEAD WITHOUT CONTRAST  Technique:  Contiguous axial images were obtained from the base of the skull through the vertex without contrast.  Comparison: 05/18/2009  Findings: Stable small lacunar infarct of the left basal  ganglia. The brain demonstrates no evidence of hemorrhage, acute infarction, edema, mass effect, extra-axial fluid collection, hydrocephalus or mass lesion.  The skull is unremarkable.  IMPRESSION: No acute findings.  Stable old lacunar infarct of the left basal ganglia.   Original Report Authenticated By: Irish Lack, M.D.    Dg Chest Portable 1 View  01/04/2013  *RADIOLOGY REPORT*  Clinical Data: Altered mental status.  PORTABLE CHEST - 1 VIEW  Comparison: 05/23/2009  Findings: Chronic-appearing atelectatic changes are seen at both lung bases.  The heart size is stable.  There is stable retrocardiac density consistent with known hiatal hernia.  No overt infiltrate or pulmonary edema is identified.  No significant pleural fluid is seen.  IMPRESSION: Chronic atelectatic changes of both lung bases and stable large hiatal hernia.   Original Report Authenticated By: Irish Lack, M.D.     Scheduled Meds: . aspirin EC  81 mg Oral Daily  . piperacillin-tazobactam (ZOSYN)  IV  3.375 g Intravenous Q8H   Continuous Infusions: . sodium chloride 100 mL/hr at 01/05/13 1241  . heparin 1,150 Units/hr (01/05/13 1435)    Principal Problem:   Delirium Active Problems:   Pubic bone fracture 1/14   Hypertension   Acute respiratory failure with hypoxia   Shakes   Dehydration     Savannah [name]  [facility] Pager [phone]. If 8PM-8AM, please contact night-coverage at [email or web address], password [email or web address] 01/05/2013, 2:40 PM  LOS: 1 day

## 2013-01-05 NOTE — Progress Notes (Addendum)
Pt's family would like to speak with MD about the results from the patient's test/procedures done today.    Norman Bier (son)  [phone] - Everlean Patterson (daughter) [phone] - Haroldine Laws (daughter)  [phone]

## 2013-01-05 NOTE — Progress Notes (Signed)
ANTICOAGULATION CONSULT NOTE   Pharmacy Consult for heparin Indication: pulmonary embolus  No Known Allergies  Patient Measurements: Height: 5\' 3"  (160 cm) Weight: 175 lb 14.8 oz (79.8 kg) IBW/kg (Calculated) : 52.4   Vital Signs: Temp: 98.2 F (36.8 C) (02/26 1950) Temp src: Oral (02/26 1950) BP: 134/59 mmHg (02/26 1950) Pulse Rate: 91 (02/26 1950)  Labs:  Recent Labs  01/04/13 1713 01/05/13 0855 01/05/13 1437 01/05/13 1936 01/05/13 2203  HGB 10.6* 10.5*  --   --   --   HCT 33.8* 33.9*  --   --   --   PLT 280 267  --   --   --   LABPROT  --   --   --   --  13.9  INR  --   --   --   --  1.08  HEPARINUNFRC  --   --   --   --  0.77*  CREATININE 0.79 0.77  0.76  --   --   --   TROPONINI  --  <0.30 <0.30 <0.30  --     Estimated Creatinine Clearance: 45.8 ml/min (by C-G formula based on Cr of 0.77).   Medical History: Past Medical History  Diagnosis Date  . Hypertension   . Hypercholesteremia   . Pelvis fracture   . Kidney stones   . Hyperglycemia     Assessment: Savannah [name] is a 79 yo F on heparin for new r/o PE.  First heparin level is slightly greater than goal.  No complications noted.  Goal of Therapy:  Heparin level 0.3-0.7 units/ml Monitor platelets by anticoagulation protocol: Yes   Plan:  - Decrease heparin to 1050 units/hr - Check 8h heparin level - Continue daily heparin level and CBC  Michel Eskelson L. Illene Bolus, PharmD, BCPS Clinical Pharmacist Pager: [phone] Pharmacy: [phone] 01/05/2013 10:53 PM

## 2013-01-05 NOTE — Progress Notes (Signed)
Portable EEG completed

## 2013-01-05 NOTE — ED Notes (Signed)
Spoke with angela in echo. She is aware pt needs to be done portable. Marylene Land advises she will put her on the list.

## 2013-01-05 NOTE — ED Provider Notes (Addendum)
I saw reviewed the resident's note and I agree with the findings and plan.   .Face to face Exam:  HEENT:  Atraumatic Resp:  Normal effort Abd:  Nondistended Neuro:No focal weakness Lymph: No adenopathy    Nelia Shi, MD 01/05/13 1520  Nelia Shi, MD 01/26/13 [phone]

## 2013-01-06 ENCOUNTER — Inpatient Hospital Stay (HOSPITAL_COMMUNITY): Payer: Medicare Other

## 2013-01-06 DIAGNOSIS — R7309 Other abnormal glucose: Secondary | ICD-10-CM

## 2013-01-06 LAB — BASIC METABOLIC PANEL
CO2: 31 mEq/L (ref 19–32)
Calcium: 7.8 mg/dL — ABNORMAL LOW (ref 8.4–10.5)
Creatinine, Ser: 0.86 mg/dL (ref 0.50–1.10)
GFR calc non Af Amer: 57 mL/min — ABNORMAL LOW (ref >90)
Glucose, Bld: 112 mg/dL — ABNORMAL HIGH (ref 70–99)
Sodium: 137 mEq/L (ref 135–145)

## 2013-01-06 LAB — CBC
HCT: 34 % — ABNORMAL LOW (ref 36.0–46.0)
Hemoglobin: 10.6 g/dL — ABNORMAL LOW (ref 12.0–15.0)
MCH: 25.7 pg — ABNORMAL LOW (ref 26.0–34.0)
MCH: 26.1 pg (ref 26.0–34.0)
MCHC: 31.2 g/dL (ref 30.0–36.0)
MCHC: 31.4 g/dL (ref 30.0–36.0)
MCV: 82.3 fL (ref 78.0–100.0)
MCV: 83 fL (ref 78.0–100.0)
Platelets: 255 10*3/uL (ref 150–400)
RBC: 3.99 MIL/uL (ref 3.87–5.11)
RBC: 4.13 MIL/uL (ref 3.87–5.11)
RDW: 16.8 % — ABNORMAL HIGH (ref 11.5–15.5)

## 2013-01-06 MED ORDER — VITAMIN D3 25 MCG (1000 UNIT) PO TABS
1000.0000 [IU] | ORAL_TABLET | Freq: Every day | ORAL | Status: DC
  Administered 2013-01-06 – 2013-01-07 (×2): 1000 [IU] via ORAL
  Filled 2013-01-06 (×2): qty 1

## 2013-01-06 MED ORDER — ENOXAPARIN SODIUM 40 MG/0.4ML ~~LOC~~ SOLN
40.0000 mg | Freq: Every day | SUBCUTANEOUS | Status: DC
  Administered 2013-01-06 – 2013-01-07 (×2): 40 mg via SUBCUTANEOUS
  Filled 2013-01-06 (×2): qty 0.4

## 2013-01-06 MED ORDER — VITAMIN D-3 25 MCG (1000 UT) PO CAPS: 1000.0000 [IU] | ORAL_CAPSULE | Freq: Every day | ORAL | Status: DC

## 2013-01-06 NOTE — Clinical Social Work Psychosocial (Signed)
Clinical Social Work Department BRIEF PSYCHOSOCIAL ASSESSMENT 01/06/2013  Patient:  Savannah [name], Savannah [name]     Account Number:  [id number]     Admit date:  01/04/2013  Clinical Social Worker:  Johnsie Cancel  Date/Time:  01/06/2013 12:24 PM  Referred by:  Physician  Date Referred:  01/06/2013  Other Referral:   Interview type:  Family Other interview type:    PSYCHOSOCIAL DATA Living Status:  FACILITY Admitted from facility:  [facility] Level of care:  Skilled Nursing Facility Primary support name:  Raynell Lanzer [phone]) Primary support relationship to patient:  CHILD, ADULT Degree of support available:   Adequate, at patient's bedside 01/05/2013.    CURRENT CONCERNS Current Concerns  Post-Acute Placement   Other Concerns:    SOCIAL WORK ASSESSMENT / PLAN CSW consulted by MD re: patient being from [facility]. CSW completed chart review and visited patient at bedside. Patient was soundly sleeping and did not wake by the sound of her name. CSW contacted the patient's daughter. CSW provided support to the daughter and explained the role of CSW in the hospital. Patient's daughter stated patient will return to [facility] when she is medically ready. CSW will contact the facility to confirm she can return. CSW will continue to follow.   Assessment/plan status:  Information/Referral to [facility]  PATIENT'S/FAMILY'S RESPONSE TO PLAN OF CARE: Patient's daughter thanked CSW for assisting in d/c plan back to [facility].    Lia Foyer, LCSWA [facility] Clinical Social Worker Contact #: [phone]

## 2013-01-06 NOTE — Procedures (Signed)
EEG report.  Brief clinical history:  79 years old female with delirium and jerking movemets. Differential diagnosis is encephalopathy versus seizures. No prior history of frank epileptic seizures.  Technique: this is a 17 channel routine scalp EEG performed at the bedside with bipolar and monopolar montages arranged in accordance to the international 10/20 system of electrode placement. One channel was dedicated to EKG recording.  The study was performed predominantly sleep. No activating procedures were utilized during the test.  Description: Patient did not spend sufficient time in the wakeful state and thus the best background is virtually impossible to assess. Patient is sleep for the most part of the test, and the sleep portion of the test showed normal architecture without intermixed epileptiform discharges.  No focal or generalized epileptiform discharges noted.  EKG showed sinus rhythm.  Impression: this is a normal awake asleep EEG. Please, be aware that a normal EEG does not exclude the possibility of epilepsy.  Clinical correlation is advised.  Wyatt Portela, MD

## 2013-01-06 NOTE — Care Management Note (Signed)
    Page 1 of 1   01/07/2013     11:04:13 AM   CARE MANAGEMENT NOTE 01/07/2013  Patient:  DAY, GREB   Account Number:  [id number]  Date Initiated:  01/06/2013  Documentation initiated by:  Letha Cape  Subjective/Objective Assessment:   dx delirium  admit- from [facility]- snf.     Action/Plan:   Anticipated DC Date:  01/07/2013   Anticipated DC Plan:  SKILLED NURSING FACILITY  In-house referral  Clinical Social Worker      DC Planning Services  CM consult      Choice offered to / List presented to:             Status of service:  Completed, signed off Medicare Important Message given?   (If response is "NO", the following Medicare IM given date fields will be blank) Date Medicare IM given:   Date Additional Medicare IM given:    Discharge Disposition:  SKILLED NURSING FACILITY  Per UR Regulation:  Reviewed for med. necessity/level of care/duration of stay  If discussed at Long Length of Stay Meetings, dates discussed:    Comments:  01/07/13 11:03 Letha Cape RN, BSN [phone] patient for discharge today to [facility], CSW following.  01/06/13 16:31 Letha Cape RN, BSN [phone] patient is from [facility] snf, CSW referral.  Patient for possible dc to snf tomorrow.

## 2013-01-06 NOTE — Progress Notes (Signed)
[facility] PROGRESS NOTE  Savannah [name] ZOX:[id number] DOB: Jul 29, 1919 DOA: 01/04/2013 PCP: No primary provider on file. 79 yo female who up until 2 weeks ago was living independently normal mental state. Had a mechanical fall and suffered from pelvic fracture and placed in short term rehab. 3 children are with her now. She initially at rehab (over a week ago) was doing well, participating in physical therapy but for the last 3 days has been delerius. Very confused. Not participating in PT and not feeding herself. They do not know of any n/v/d. Or fevers. Patient cannot provide much history. Pt is delirius but can participate in simple commands and follow my verbal instruction. She denies any pain anywhere. No sob, cp. Family has not really noticed any focal neuro deficits. On arrival to ED found to be hypoxic of 85% on RA but EDP thinks this may be an error, unsure if so. Family also reports for several days she has been having these "shaking spells" where she is very restless and her arms and legs tremor. Also report she has not had much sleep in several days.     Assessment/Plan: 1. Delirium:  - resolved. Etiology probably hypoxia and opiates.    2. Hypoxia: - etiology most likely  Atelectases from opiate induced hypopnea, PE ruled out by CTA chest  3. Recent pelvic fracture to resume PT  4. 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 DM - mild Last time we checked A1C it was 6.8 - plan for diet control.  6. HTN 7. HL - was on statin prior to admission.   Code Status: DNR Family Communication: son Disposition Plan: snf    Consultants:  none  Procedures:  None    HPI/Subjective: Has no complains   Objective: Filed Vitals:   01/05/13 1950 01/06/13 0440 01/06/13 0525 01/06/13 1400  BP: 134/59 173/81 152/72 145/63  Pulse: 91 78 80 74  Temp: 98.2 F (36.8 C) 97.6  F (36.4 C)  97.6 F (36.4 C)  TempSrc: Oral Oral  Oral  Resp: 18 20  20   Height:      Weight:  80.5 kg (177 lb 7.5 oz)    SpO2: 95% 96%  96%    Intake/Output Summary (Last 24 hours) at 01/06/13 1510 Last data filed at 01/06/13 1300  Gross per 24 hour  Intake 503.18 ml  Output      1 ml  Net 502.18 ml   Filed Weights   01/05/13 1126 01/06/13 0440  Weight: 79.8 kg (175 lb 14.8 oz) 80.5 kg (177 lb 7.5 oz)    Exam:   General:  Alert , oriented to self, not agitated   Cardiovascular: regular   Respiratory: ctab     Abdomen: soft, nt   Data Reviewed: Basic Metabolic Panel:  Recent Labs Lab 01/04/13 1713 01/05/13 0855 01/06/13 0010  NA 138 140 137  K 4.5 4.0 3.8  CL 99 100 101  CO2 31 32 31  GLUCOSE 102* 94 112*  BUN 24* 20 19  CREATININE 0.79 0.77  0.76 0.86  CALCIUM 8.9 8.4 7.8*  MG  --  2.1  --    Liver Function Tests:  Recent Labs Lab 01/04/13 1713  AST 24  ALT 16  ALKPHOS 63  BILITOT 0.8  PROT 5.9*  ALBUMIN 2.6*   No results found for this basename: LIPASE,  AMYLASE,  in the last 168 hours  Recent Labs Lab 01/05/13 0855  AMMONIA 15   CBC:  Recent Labs Lab 01/04/13 1713 01/05/13 0855 01/06/13 0010 01/06/13 0734  WBC 9.4 6.1 6.6 6.8  HGB 10.6* 10.5* 10.4* 10.6*  HCT 33.8* 33.9* 33.1* 34.0*  MCV 82.0 83.1 83.0 82.3  PLT 280 267 255 268   Cardiac Enzymes:  Recent Labs Lab 01/05/13 0855 01/05/13 1437 01/05/13 1936  TROPONINI <0.30 <0.30 <0.30   BNP (last 3 results) No results found for this basename: PROBNP,  in the last 8760 hours CBG:  Recent Labs Lab 01/04/13 1724  GLUCAP 99    Recent Results (from the past 240 hour(s))  GRAM STAIN     Status: None   Collection Time    01/04/13  6:16 PM      Result Value Range Status   Specimen Description URINE, RANDOM   Final   Special Requests NONE   Final   Gram Stain     Final   Value: CYTOSPIN SLIDE     SQUAMOUS EPITHELIAL CELLS PRESENT     NO WBC SEEN     NEGATIVE  FOR BACTERIA   Report Status 01/04/2013 FINAL   Final  URINE CULTURE     Status: None   Collection Time    01/04/13  6:16 PM      Result Value Range Status   Specimen Description URINE, CATHETERIZED   Final   Special Requests NONE   Final   Culture  Setup Time 01/04/2013 19:24   Final   Colony Count NO GROWTH   Final   Culture NO GROWTH   Final   Report Status 01/05/2013 FINAL   Final  MRSA PCR SCREENING     Status: None   Collection Time    01/05/13 12:40 PM      Result Value Range Status   MRSA by PCR NEGATIVE  NEGATIVE Final   Comment:            The GeneXpert MRSA Assay (FDA     approved for NASAL specimens     only), is one component of a     comprehensive MRSA colonization     surveillance program. It is not     intended to diagnose MRSA     infection nor to guide or     monitor treatment for     MRSA infections.     Studies: Ct Head Wo Contrast  01/04/2013  *RADIOLOGY REPORT*  Clinical Data: Aphasia and right upper extremity weakness  CT HEAD WITHOUT CONTRAST  Technique:  Contiguous axial images were obtained from the base of the skull through the vertex without contrast.  Comparison: 05/18/2009  Findings: Stable small lacunar infarct of the left basal ganglia. The brain demonstrates no evidence of hemorrhage, acute infarction, edema, mass effect, extra-axial fluid collection, hydrocephalus or mass lesion.  The skull is unremarkable.  IMPRESSION: No acute findings.  Stable old lacunar infarct of the left basal ganglia.   Original Report Authenticated By: Irish Lack, M.D.    Ct Angio Chest Pe W/cm &/or Wo Cm  01/05/2013  *RADIOLOGY REPORT*  Clinical Data: Hypoxia.  CT ANGIOGRAPHY CHEST  Technique:  Multidetector CT imaging of the chest using the standard protocol during bolus administration of intravenous contrast. Multiplanar reconstructed images including MIPs were obtained and reviewed to evaluate the vascular anatomy.  Contrast: 60mL OMNIPAQUE IOHEXOL 350 MG/ML SOLN   Comparison: One-view chest 01/04/2013.  CTA chest 05/20/2009.  Findings: Pulmonary arterial opacification is excellent.  There are no focal filling defects to suggest pulmonary emboli.  The heart is enlarged.  Coronary artery calcifications are present.  Additional calcifications are present at the aortic arch.  No significant mediastinal or axillary adenopathy is evident.  A large hiatal hernia is present.  Limited imaging of the upper abdomen is unremarkable.  Small bilateral pleural effusions are similar to the prior CT. There is mild associated atelectasis.  No focal nodule, mass, or airspace disease is present.  The bone windows demonstrate a stable appearance of the T11 compression fracture.  Mild rightward curvature in the upper thoracic spine is stable.  IMPRESSION:  1.  No evidence of pulmonary embolus. 2.  Persistent small bilateral pleural effusions and associated atelectasis. 3.  Large hiatal hernia. 4.  Stable T11 compression fracture.   Original Report Authenticated By: Marin Roberts, M.D.    Dg Chest Portable 1 View  01/04/2013  *RADIOLOGY REPORT*  Clinical Data: Altered mental status.  PORTABLE CHEST - 1 VIEW  Comparison: 05/23/2009  Findings: Chronic-appearing atelectatic changes are seen at both lung bases.  The heart size is stable.  There is stable retrocardiac density consistent with known hiatal hernia.  No overt infiltrate or pulmonary edema is identified.  No significant pleural fluid is seen.  IMPRESSION: Chronic atelectatic changes of both lung bases and stable large hiatal hernia.   Original Report Authenticated By: Irish Lack, M.D.     Scheduled Meds: . aspirin EC  81 mg Oral Daily  . cholecalciferol  1,000 Units Oral Daily  . enoxaparin (LOVENOX) injection  40 mg Subcutaneous Daily   Continuous Infusions:    Principal Problem:   Delirium Active Problems:   Pubic bone fracture 1/14   Hypertension   Acute respiratory failure with hypoxia   Shakes    Dehydration     Darthula Desa  [facility] Pager [phone]. If 8PM-8AM, please contact night-coverage at [email or web address], password [facility] 01/06/2013, 3:10 PM  LOS: 2 days

## 2013-01-06 NOTE — Evaluation (Signed)
Physical Therapy Evaluation Patient Details Name: Savannah [name] MRN: [id number] DOB: [birth year] Today's Date: 01/06/2013 Time: 4098-1191 PT Time Calculation (min): 20 min  PT Assessment / Plan / Recommendation Clinical Impression  79 y.o. female admitted to [facility] from [facility] (where she was recieving rehab after a recent fall with pelvic fx - WBAT) with AMS and seizure-like activity.  She presents today with decreased mobility, some mild reports of discomfort in her pelvic area with moving and increased DOE with mobility.  She would benefit from returning to [facility] to complete her rehab after discharge from [facility].      PT Assessment  Patient needs continued PT services    Follow Up Recommendations  SNF    Does the patient have the potential to tolerate intense rehabilitation    NA  Barriers to Discharge None none    Equipment Recommendations  None recommended by PT    Recommendations for Other Services   none  Frequency Min 3X/week    Precautions / Restrictions Precautions Precautions: Fall Restrictions LLE Weight Bearing: Weight bearing as tolerated   Pertinent Vitals/Pain Reports some mild discomfort with mobility in pelvic region.  Increased DOE with mobility to 3/4 despite O2 [place] use.  HR "tachy" per RN tech who was looking in to check on pt during mobility.        Mobility  Bed Mobility Supine to Sit: 3: Mod assist;With rails;HOB elevated Details for Bed Mobility Assistance: mod assist to support trunk to get to sitting.  Pt using bil upper extremities to pull against railing and therapist.  Draw pad used to help prgress hips to EOB.   Transfers Transfers: Sit to Stand;Stand to Sit;Stand Pivot Transfers Sit to Stand: 3: Mod assist;From elevated surface;With upper extremity assist;From bed Stand to Sit: 3: Mod assist;With upper extremity assist;With armrests;To chair/3-in-1 Stand Pivot Transfers: 3: Mod assist Details for Transfer Assistance: mod assist to  stand from bed x 2 in preparation for transfer to chair.  Pt did better second stand from elevated bed.  Assist needed to support trunk over weak knees during transitions.          PT Diagnosis: Difficulty walking;Abnormality of gait;Generalized weakness;Acute pain;Altered mental status  PT Problem List: Decreased strength;Decreased activity tolerance;Decreased balance;Decreased mobility;Decreased cognition;Decreased knowledge of use of DME;Decreased safety awareness;Decreased knowledge of precautions;Cardiopulmonary status limiting activity;Pain PT Treatment Interventions: DME instruction;Gait training;Functional mobility training;Therapeutic activities;Therapeutic exercise;Balance training;Neuromuscular re-education;Cognitive remediation;Wheelchair mobility training;Patient/family education   PT Goals Acute Rehab PT Goals PT Goal Formulation: With patient Time For Goal Achievement: 01/20/13 Potential to Achieve Goals: Good Pt will go Supine/Side to Sit: with min assist PT Goal: Supine/Side to Sit - Progress: Goal set today Pt will go Sit to Supine/Side: with min assist PT Goal: Sit to Supine/Side - Progress: Goal set today Pt will go Sit to Stand: with min assist PT Goal: Sit to Stand - Progress: Goal set today Pt will go Stand to Sit: with min assist PT Goal: Stand to Sit - Progress: Goal set today Pt will Transfer Bed to Chair/Chair to Bed: with min assist PT Transfer Goal: Bed to Chair/Chair to Bed - Progress: Goal set today Pt will Ambulate: 16 - 50 feet;with min assist PT Goal: Ambulate - Progress: Goal set today  Visit Information  Last PT Received On: 01/06/13 Assistance Needed: +1    Subjective Data  Subjective: Pt reports that she is currently at [facility].  She could not tell me why she was there for  therapy (i.e. broken pelvis) Patient Stated Goal: to go back to [facility] place   Prior Functioning  Home Living Available Help at Discharge: Skilled Nursing  Facility Additional Comments: from [facility] where she was recieving rehab after recent pelvic fracture.   Prior Function Level of Independence: Needs assistance Needs Assistance: Bathing;Dressing;Grooming;Toileting;Gait;Transfers Communication Communication: HOH Dominant Hand: Right    Cognition  Cognition Overall Cognitive Status: Impaired Area of Impairment: Memory;Awareness of errors;Awareness of deficits Arousal/Alertness: Awake/alert Orientation Level: Disoriented to;Place;Time;Situation Behavior During Session: WFL for tasks performed    Extremity/Trunk Assessment Right Lower Extremity Assessment RLE ROM/Strength/Tone Deficits: per gross functional assessment 3-/5 bil legs Left Lower Extremity Assessment LLE ROM/Strength/Tone Deficits: per gross functional assessment 3-/5 bil legs   Balance Static Sitting Balance Static Sitting - Balance Support: Bilateral upper extremity supported;Feet supported Static Sitting - Level of Assistance: 5: Stand by assistance Static Sitting - Comment/# of Minutes: 3 mins in preparation for standing.  While sitting RN tech came in to check on pt.  Per RN tech she was showing "tachy" on the monitors Static Standing Balance Static Standing - Balance Support: Bilateral upper extremity supported Static Standing - Level of Assistance: 3: Mod assist Static Standing - Comment/# of Minutes: mod assist to stand with RW to support trunk over weak legs  End of Session PT - End of Session Equipment Utilized During Treatment: Gait belt;Oxygen Activity Tolerance: Patient limited by fatigue Patient left: in chair;with call bell/phone within reach;with family/visitor present (two family memebers came in at end of session.  )       Savannah [name], PT, DPT [phone]   01/06/2013, 4:37 PM

## 2013-01-07 NOTE — H&P (Signed)
Report called to nurse Waneta Martins at [facility].

## 2013-01-07 NOTE — Clinical Social Work Note (Signed)
CSW was consulted to complete discharge of patient. Pt to transfer to [facility] today via [facility]. Facility and family are aware of d/c. D/C packet complete with chart copy, signed FL2.  CSW signing off as no other CSW needs identified at this time.  Lia Foyer, LCSWA [facility] Clinical Social Worker Contact #: [phone]

## 2013-01-07 NOTE — Discharge Summary (Signed)
Physician Discharge Summary  Savannah [name] AVW:[id number] DOB: 1919/02/08 DOA: 01/04/2013  PCP: Londell Moh, MD  Admit date: 01/04/2013 Discharge date: 01/07/2013  Time spent: 45 minutes  Recommendations for Outpatient Follow-up:  1. Patient needs 24 hrs/day oxygen at 2 liters per nasal canula - DO NOT WEAN 2. Patient needs referral to pulmonary clinic for PFTs post discharge from SNF 3. If patient recovers well she could be referred to General Surgery for consideration of hiatal hernia repair  Discharge Diagnoses:   Delirium from opiates and hypoxia - resolved    Acute respiratory failure with hypoxia Chronic respiratory failure - most likely due to restrictive lung disease from large hiatal hernia and obesity    Pubic bone fracture 1/14   Hypertension Constipation  Weakness and deconditioning Multiple old lacunar strokes.   Discharge Condition: fair, back to SNF for rehab   Diet recommendation: heart healthy   Filed Weights   01/06/13 0440 01/07/13 0423 01/07/13 0500  Weight: 80.5 kg (177 lb 7.5 oz) 79 kg (174 lb 2.6 oz) 79 kg (174 lb 2.6 oz)    History of present illness:  79 yo female who up until 2 weeks ago was living independently normal mental state. Had a mechanical fall and suffered from pelvic fracture and placed in short term rehab. 3 children are with her now. She initially at rehab (over a week ago) was doing well, participating in physical therapy but for the last 3 days has been delerius. Very confused. Not participating in PT and not feeding herself. They do not know of any n/v/d. Or fevers. Patient cannot provide much history. Pt is delirius but can participate in simple commands and follow my verbal instruction. She denies any pain anywhere. No sob, cp. Family has not really noticed any focal neuro deficits. On arrival to ED found to be hypoxic of 85% on RA but EDP thinks this may be an error, unsure if so. Family also reports for several days she has been  having these "shaking spells" where she is very restless and her arms and legs tremor. Also report she has not had much sleep in several days.      Hospital Course:  1. Delirium: - resolved. Etiology probably hypoxia and opiates.  2. Hypoxia: - etiology most likely Atelectases from opiate induced hypopnea, PE ruled out by CTA chest . Patient probably has chronic respiratory failure form large hiatal hernia and obesity. Plan for oxygen 24 hrs/day and outpt PFTs 3. Recent pelvic fracture to resume PT at SNF 4. Old basal ganglia infarct with chronic eye deviation - given presentation with AMS we did obtain EEG and MRI brain which did not indicate new acute large strokes or seizures.  5. DM - mild - last time we checked A1C it was 6.8 - plan for diet control.  6. HTN 7. HL - was on statin prior to admission.   Procedures: MRI brain EEG CTA chest   Consultations:  None   Discharge Exam: Filed Vitals:   01/06/13 1400 01/06/13 2048 01/07/13 0423 01/07/13 0500  BP: 145/63 159/67 155/70   Pulse: 74 100 83   Temp: 97.6 F (36.4 C) 98.7 F (37.1 C) 98.9 F (37.2 C)   TempSrc: Oral Oral Oral   Resp: 20 28 20    Height:      Weight:   79 kg (174 lb 2.6 oz) 79 kg (174 lb 2.6 oz)  SpO2: 96% 93% 98%     General: axox2 Cardiovascular: RRR, no M  Respiratory: poor effort, no wheezes   Discharge Instructions  Discharge Orders   Future Orders Complete By Expires     Diet - low sodium heart healthy  As directed     Increase activity slowly  As directed         Medication List    STOP taking these medications       HYDROcodone-acetaminophen 5-325 MG per tablet  Commonly known as:  NORCO/VICODIN      TAKE these medications       alendronate 70 MG tablet  Commonly known as:  FOSAMAX  Take 70 mg by mouth every 7 (seven) days. Take with a full glass of water on an empty stomach.     amLODipine 2.5 MG tablet  Commonly known as:  NORVASC  Take 2.5 mg by mouth daily.      aspirin EC 81 MG tablet  Take 81 mg by mouth daily.     calcium-vitamin D 500-200 MG-UNIT per tablet  Commonly known as:  OSCAL WITH D  Take 1 tablet by mouth daily.     DSS 100 MG Caps  Take 100 mg by mouth 2 (two) times daily.     metFORMIN 500 MG tablet  Commonly known as:  GLUCOPHAGE  Take 1 tablet (500 mg total) by mouth daily with breakfast.     metoprolol tartrate 25 MG tablet  Commonly known as:  LOPRESSOR  Take 12.5 mg by mouth 2 (two) times daily.     polyethylene glycol packet  Commonly known as:  MIRALAX / GLYCOLAX  Take 17 g by mouth daily.     rosuvastatin 20 MG tablet  Commonly known as:  CRESTOR  Take 20 mg by mouth at bedtime.     Vitamin D-3 1000 UNITS Caps  Take 1,000 Units by mouth daily.           Follow-up Information   Follow up with Londell Moh, MD. (after release from snf)    Contact information:   [address] [place] [address] [phone]        The results of significant diagnostics from this hospitalization (including imaging, microbiology, ancillary and laboratory) are listed below for reference.    Significant Diagnostic Studies: Dg Lumbar Spine 2-3 Views  12/25/2012  *RADIOLOGY REPORT*  Clinical Data: Status post fall  LUMBAR SPINE - 2-3 VIEW  Comparison: None.  Findings:  There is a compression deformity involving the T11 vertebra, which is unchanged from 05/20/2009.  There are no new fracture deformities identified.  There is a first-degree anterolisthesis of L4 on L5 measuring 7 mm. Multilevel disc space narrowing and ventral spurring is noted.  Degenerative disc disease is most severe at the L5 S1 level.  Calcified atherosclerotic disease affects the abdominal aorta and its branches.  IMPRESSION:  1.  Lumbar spondylosis. 2.  Stable T11 compression fracture.   Original Report Authenticated By: Signa Kell, M.D.    Dg Pelvis 1-2 Views  12/25/2012  *RADIOLOGY REPORT*  Clinical Data: Status post fall   PELVIS - 1-2 VIEW  Comparison: 06/20/2005  Findings: Bones are diffusely osteopenic and there is bowel gas overlying the sacrum.There are chronic fracture deformities involving the superior and inferior right pubic rami. Cortical irregularity involving the left pubic rami noted which is suspicious for fracture in this area.  Both hips appear located. No evidence for hip fracture.  IMPRESSION:  1.  Chronic fractures involve the right superior inferior pubic rami. 2.  Suspect interval fracture  of the left pubic rami. 3.  Osteopenia and overlying bowel gas diminishes sensitivity for detecting sacral insufficiency fracture.  If there is a concern for sacral insufficiency fracture then CT or MRI may provide more sensitive assessment of this area.   Original Report Authenticated By: Signa Kell, M.D.    Dg Hip 1 View Left  12/25/2012  *RADIOLOGY REPORT*  Clinical Data: Status post fall  LEFT HIP - 1 VIEW:a  Comparison: 8/11/six  Findings: The left hip appears located.  No evidence for dislocation.  There is been interval fracture of the left superior and inferior pubic rami compared with 06/20/2005.  Again noted are fractures involving the right superior and inferior pubic rami.  IMPRESSION:  1.  Acute fractures involve the left superior and inferior pubic rami. 2.  Chronic fractures involve the right pubic rami   Original Report Authenticated By: Signa Kell, M.D.    Ct Head Wo Contrast  01/04/2013  *RADIOLOGY REPORT*  Clinical Data: Aphasia and right upper extremity weakness  CT HEAD WITHOUT CONTRAST  Technique:  Contiguous axial images were obtained from the base of the skull through the vertex without contrast.  Comparison: 05/18/2009  Findings: Stable small lacunar infarct of the left basal ganglia. The brain demonstrates no evidence of hemorrhage, acute infarction, edema, mass effect, extra-axial fluid collection, hydrocephalus or mass lesion.  The skull is unremarkable.  IMPRESSION: No acute findings.   Stable old lacunar infarct of the left basal ganglia.   Original Report Authenticated By: Irish Lack, M.D.    Ct Angio Chest Pe W/cm &/or Wo Cm  01/05/2013  *RADIOLOGY REPORT*  Clinical Data: Hypoxia.  CT ANGIOGRAPHY CHEST  Technique:  Multidetector CT imaging of the chest using the standard protocol during bolus administration of intravenous contrast. Multiplanar reconstructed images including MIPs were obtained and reviewed to evaluate the vascular anatomy.  Contrast: 60mL OMNIPAQUE IOHEXOL 350 MG/ML SOLN  Comparison: One-view chest 01/04/2013.  CTA chest 05/20/2009.  Findings: Pulmonary arterial opacification is excellent.  There are no focal filling defects to suggest pulmonary emboli.  The heart is enlarged.  Coronary artery calcifications are present.  Additional calcifications are present at the aortic arch.  No significant mediastinal or axillary adenopathy is evident.  A large hiatal hernia is present.  Limited imaging of the upper abdomen is unremarkable.  Small bilateral pleural effusions are similar to the prior CT. There is mild associated atelectasis.  No focal nodule, mass, or airspace disease is present.  The bone windows demonstrate a stable appearance of the T11 compression fracture.  Mild rightward curvature in the upper thoracic spine is stable.  IMPRESSION:  1.  No evidence of pulmonary embolus. 2.  Persistent small bilateral pleural effusions and associated atelectasis. 3.  Large hiatal hernia. 4.  Stable T11 compression fracture.   Original Report Authenticated By: Marin Roberts, M.D.    Mr Brain Wo Contrast  01/07/2013  *RADIOLOGY REPORT*  Clinical Data: Confusion.  Visual disturbance.  MRI HEAD WITHOUT CONTRAST  Technique:  Multiplanar, multiecho pulse sequences of the brain and surrounding structures were obtained according to standard protocol without intravenous contrast.  Comparison: Head CT 01/04/2013.  MRI 05/19/2009.  Findings: There are mild chronic small vessel  changes of the pons. There are a few old small vessel cerebellar infarctions.  There are chronic small vessel changes affecting the thalami, basal ganglia and throughout the hemispheric white matter.  There is a punctate focus of restricted diffusion in the left parietal subcortical white matter that could  represent a tiny acute or subacute small vessel infarction.  No cortical or large vessel territory infarction.  No mass lesion, hemorrhage, hydrocephalus or extra- axial collection.  A few of the old white matter infarctions are associated with minimal hemosiderin deposition.  No pituitary mass. Sinuses are clear.  There is some fluid in the mastoid air cells, right more than left.  No skull or skull base lesion.  IMPRESSION: Background pattern of brain atrophy and extensive chronic small vessel disease throughout.  Suspicion of an acute punctate white matter infarction in the left parietal subcortical brain, no more than one or 2 mm in size.   Original Report Authenticated By: Paulina Fusi, M.D.    Dg Chest Portable 1 View  01/04/2013  *RADIOLOGY REPORT*  Clinical Data: Altered mental status.  PORTABLE CHEST - 1 VIEW  Comparison: 05/23/2009  Findings: Chronic-appearing atelectatic changes are seen at both lung bases.  The heart size is stable.  There is stable retrocardiac density consistent with known hiatal hernia.  No overt infiltrate or pulmonary edema is identified.  No significant pleural fluid is seen.  IMPRESSION: Chronic atelectatic changes of both lung bases and stable large hiatal hernia.   Original Report Authenticated By: Irish Lack, M.D.     Microbiology: Recent Results (from the past 240 hour(s))  GRAM STAIN     Status: None   Collection Time    01/04/13  6:16 PM      Result Value Range Status   Specimen Description URINE, RANDOM   Final   Special Requests NONE   Final   Gram Stain     Final   Value: CYTOSPIN SLIDE     SQUAMOUS EPITHELIAL CELLS PRESENT     NO WBC SEEN      NEGATIVE FOR BACTERIA   Report Status 01/04/2013 FINAL   Final  URINE CULTURE     Status: None   Collection Time    01/04/13  6:16 PM      Result Value Range Status   Specimen Description URINE, CATHETERIZED   Final   Special Requests NONE   Final   Culture  Setup Time 01/04/2013 19:24   Final   Colony Count NO GROWTH   Final   Culture NO GROWTH   Final   Report Status 01/05/2013 FINAL   Final  MRSA PCR SCREENING     Status: None   Collection Time    01/05/13 12:40 PM      Result Value Range Status   MRSA by PCR NEGATIVE  NEGATIVE Final   Comment:            The GeneXpert MRSA Assay (FDA     approved for NASAL specimens     only), is one component of a     comprehensive MRSA colonization     surveillance program. It is not     intended to diagnose MRSA     infection nor to guide or     monitor treatment for     MRSA infections.     Labs: Basic Metabolic Panel:  Recent Labs Lab 01/04/13 1713 01/05/13 0855 01/06/13 0010  NA 138 140 137  K 4.5 4.0 3.8  CL 99 100 101  CO2 31 32 31  GLUCOSE 102* 94 112*  BUN 24* 20 19  CREATININE 0.79 0.77  0.76 0.86  CALCIUM 8.9 8.4 7.8*  MG  --  2.1  --    Liver Function Tests:  Recent Labs Lab 01/04/13 1713  AST 24  ALT 16  ALKPHOS 63  BILITOT 0.8  PROT 5.9*  ALBUMIN 2.6*   No results found for this basename: LIPASE, AMYLASE,  in the last 168 hours  Recent Labs Lab 01/05/13 0855  AMMONIA 15   CBC:  Recent Labs Lab 01/04/13 1713 01/05/13 0855 01/06/13 0010 01/06/13 0734  WBC 9.4 6.1 6.6 6.8  HGB 10.6* 10.5* 10.4* 10.6*  HCT 33.8* 33.9* 33.1* 34.0*  MCV 82.0 83.1 83.0 82.3  PLT 280 267 255 268   Cardiac Enzymes:  Recent Labs Lab 01/05/13 0855 01/05/13 1437 01/05/13 1936  TROPONINI <0.30 <0.30 <0.30   BNP: BNP (last 3 results) No results found for this basename: PROBNP,  in the last 8760 hours CBG:  Recent Labs Lab 01/04/13 1724  GLUCAP 99       Signed:  Danetra Glock  [facility] 01/07/2013, 9:07 AM

## 2013-01-07 NOTE — Progress Notes (Signed)
1150 Patient discharge to [facility] per order via ambulance accompanied by 2 attendants. Skin WNL

## 2013-06-08 ENCOUNTER — Non-Acute Institutional Stay (SKILLED_NURSING_FACILITY): Payer: Medicare Other | Admitting: Adult Health

## 2013-06-08 DIAGNOSIS — K5900 Constipation, unspecified: Secondary | ICD-10-CM

## 2013-06-08 DIAGNOSIS — I10 Essential (primary) hypertension: Secondary | ICD-10-CM

## 2013-06-08 DIAGNOSIS — E785 Hyperlipidemia, unspecified: Secondary | ICD-10-CM

## 2013-06-08 DIAGNOSIS — J9610 Chronic respiratory failure, unspecified whether with hypoxia or hypercapnia: Secondary | ICD-10-CM

## 2013-06-08 DIAGNOSIS — M810 Age-related osteoporosis without current pathological fracture: Secondary | ICD-10-CM

## 2013-06-18 ENCOUNTER — Encounter: Payer: Self-pay | Admitting: Adult Health

## 2013-06-18 DIAGNOSIS — M810 Age-related osteoporosis without current pathological fracture: Secondary | ICD-10-CM | POA: Insufficient documentation

## 2013-06-18 DIAGNOSIS — J9610 Chronic respiratory failure, unspecified whether with hypoxia or hypercapnia: Secondary | ICD-10-CM | POA: Insufficient documentation

## 2013-06-18 DIAGNOSIS — E785 Hyperlipidemia, unspecified: Secondary | ICD-10-CM | POA: Insufficient documentation

## 2013-06-18 DIAGNOSIS — K5900 Constipation, unspecified: Secondary | ICD-10-CM | POA: Insufficient documentation

## 2013-06-18 NOTE — Progress Notes (Signed)
Patient ID: Savannah [name], female   DOB: February 15, 1919, 79 y.o.   MRN: [id number]  [facility]  No Known Allergies   Chief Complaint  Patient presents with  . Medical Managment of Chronic Issues    HPI: She is being seen for the managemnt of her chronic illnesses. There are no concerns being voiced by the nursing staff at this time. She is not voicing any complaints stating that she is feeling good.   Past Medical History  Diagnosis Date  . Hypertension   . Hypercholesteremia   . Pelvis fracture   . Kidney stones   . Hyperglycemia     Past Surgical History  Procedure Laterality Date  . Abdominal hysterectomy      VITAL SIGNS BP 127/69  Pulse 75  Ht 5\' 3"  (1.6 m)  Wt 168 lb 3.2 oz (76.295 kg)  BMI 29.8 kg/m2   Patient's Medications  New Prescriptions   No medications on file  Previous Medications   ALENDRONATE (FOSAMAX) 70 MG TABLET    Take 70 mg by mouth every 7 (seven) days. Take with a full glass of water on an empty stomach.   ASPIRIN EC 81 MG TABLET    Take 81 mg by mouth daily.   CALCIUM-VITAMIN D (OSCAL WITH D) 500-200 MG-UNIT PER TABLET    Take 1 tablet by mouth daily.   CHOLECALCIFEROL (VITAMIN D-3) 1000 UNITS CAPS    Take 1,000 Units by mouth daily.   DOCUSATE SODIUM (DSS) 100 MG CAPS    Take 100 mg by mouth 2 (two) times daily.   METFORMIN (GLUCOPHAGE) 500 MG TABLET    Take 1 tablet (500 mg total) by mouth daily with breakfast.   METOPROLOL TARTRATE (LOPRESSOR) 25 MG TABLET    Take 12.5 mg by mouth 2 (two) times daily.   POLYETHYLENE GLYCOL (MIRALAX / GLYCOLAX) PACKET    Take 17 g by mouth daily.   ROSUVASTATIN (CRESTOR) 20 MG TABLET    Take 20 mg by mouth at bedtime.  Modified Medications   No medications on file  Discontinued Medications   AMLODIPINE (NORVASC) 2.5 MG TABLET    Take 2.5 mg by mouth daily.    SIGNIFICANT DIAGNOSTIC EXAMS   LABS REVIEWED:   01-11-13; wbc 7.7 ;hgb 9.8; hct 31.2 ;mcv 81.5; plkt 293; glucose 85; bun 13; creat0.64;  K+  4.5 Na++ 139; chol 106; ldl 57; trig 65; hgb a1c 6.5; vit d 33 02-15-13; urine culture: no growth    Review of Systems  Constitutional: Negative for malaise/fatigue.  Respiratory: Negative for cough and shortness of breath.   Cardiovascular: Negative for chest pain and leg swelling.  Gastrointestinal: Negative for heartburn, abdominal pain and constipation.  Musculoskeletal: Negative for myalgias and joint pain.  Skin: Negative.   Neurological: Negative for headaches.  Psychiatric/Behavioral: Negative for depression. The patient has insomnia.     Physical Exam  Constitutional: She appears well-developed and well-nourished.  Neck: Neck supple. No JVD present. No thyromegaly present.  Cardiovascular: Normal rate, regular rhythm and intact distal pulses.   Respiratory: Effort normal. No respiratory distress. She has no wheezes.  GI: Soft. Bowel sounds are normal. She exhibits no distension. There is no tenderness.  Musculoskeletal: Normal range of motion. She exhibits no edema.  Neurological: She is alert.  Skin: Skin is warm and dry.  Psychiatric: She has a normal mood and affect.      ASSESSMENT/ PLAN:  Osteoporosis, unspecified Will continue her fosamax 70 mg weekly and calcium  and vit d supplements and will mointor   Hypertension She is stable will continue lopressor 12.5 mg twice daily will and asa 81 mg daily  monitor her status her norvasc was stopped earlier this month.,   Type II or unspecified type diabetes mellitus without mention of complication, uncontrolled Will continue her metformin 500 mg daily and will monitor   Constipation Will continue her miralax daily and her colace twice daily   Dyslipidemia Will continue her crestor 20 mg daily and will monitor; her ldl is 57.   Chronic respiratory failure She is stable; does require O2 continuous. Will montor   Time spent with patient 50 minutes

## 2013-06-18 NOTE — Assessment & Plan Note (Signed)
Will continue her metformin 500 mg daily and will monitor

## 2013-06-18 NOTE — Assessment & Plan Note (Signed)
Will continue her crestor 20 mg daily and will monitor; her ldl is 57.

## 2013-06-18 NOTE — Assessment & Plan Note (Addendum)
She is stable will continue lopressor 12.5 mg twice daily will and asa 81 mg daily  monitor her status her norvasc was stopped earlier this month.,

## 2013-06-18 NOTE — Assessment & Plan Note (Signed)
She is stable; does require O2 continuous. Will montor

## 2013-06-18 NOTE — Assessment & Plan Note (Signed)
Will continue her miralax daily and her colace twice daily

## 2013-06-18 NOTE — Assessment & Plan Note (Signed)
Will continue her fosamax 70 mg weekly and calcium and vit d supplements and will mointor

## 2013-07-04 ENCOUNTER — Encounter: Payer: Self-pay | Admitting: Internal Medicine

## 2013-07-04 ENCOUNTER — Non-Acute Institutional Stay (SKILLED_NURSING_FACILITY): Payer: PRIVATE HEALTH INSURANCE | Admitting: Internal Medicine

## 2013-07-04 DIAGNOSIS — IMO0001 Reserved for inherently not codable concepts without codable children: Secondary | ICD-10-CM

## 2013-07-04 DIAGNOSIS — M810 Age-related osteoporosis without current pathological fracture: Secondary | ICD-10-CM

## 2013-07-04 DIAGNOSIS — I10 Essential (primary) hypertension: Secondary | ICD-10-CM

## 2013-07-04 DIAGNOSIS — K5900 Constipation, unspecified: Secondary | ICD-10-CM

## 2013-07-04 DIAGNOSIS — E785 Hyperlipidemia, unspecified: Secondary | ICD-10-CM

## 2013-07-04 DIAGNOSIS — J9610 Chronic respiratory failure, unspecified whether with hypoxia or hypercapnia: Secondary | ICD-10-CM

## 2013-07-04 NOTE — Progress Notes (Signed)
Patient ID: Savannah [name], female   DOB: [birth year], 79 y.o.   MRN: [id number]  [facility]  Code Status: DNR  No Known Allergies  Chief Complaint: annual exam  HPI:   79 y/o femaleis a long term resident of the facility. She has been started under [facility] recently. She was seen in her room today. She is in no distress and denies any complaints. No concerns from staff  Review of Systems  Constitutional: Negative for malaise/fatigue.  Respiratory: Negative for cough and shortness of breath.   Cardiovascular: Negative for chest pain and leg swelling.  Gastrointestinal: Negative for heartburn, abdominal pain and constipation.  Musculoskeletal: Negative for myalgias and joint pain.  Skin: Negative.   Neurological: Negative for headaches.  Psychiatric/Behavioral: Negative for depression.     Past Medical History  Diagnosis Date  . Hypertension   . Hypercholesteremia   . Pelvis fracture   . Kidney stones   . Hyperglycemia    Past Surgical History  Procedure Laterality Date  . Abdominal hysterectomy     Social History:   reports that she has quit smoking. She does not have any smokeless tobacco history on file. She reports that she does not drink alcohol or use illicit drugs.  Family History  Problem Relation Age of Onset  . Heart disease Mother   . Heart disease Father   . Diabetes Sister   . Stroke Brother     Medications: Patient's Medications  New Prescriptions   No medications on file  Previous Medications   ALENDRONATE (FOSAMAX) 70 MG TABLET    Take 70 mg by mouth every 7 (seven) days. Take with a full glass of water on an empty stomach.   ASPIRIN EC 81 MG TABLET    Take 81 mg by mouth daily.   CALCIUM-VITAMIN D (OSCAL WITH D) 500-200 MG-UNIT PER TABLET    Take 1 tablet by mouth daily.   CHOLECALCIFEROL (VITAMIN D-3) 1000 UNITS CAPS    Take 1,000 Units by mouth daily.   DOCUSATE SODIUM (DSS) 100 MG CAPS    Take 100 mg by mouth 2 (two) times  daily.   METFORMIN (GLUCOPHAGE) 500 MG TABLET    Take 1 tablet (500 mg total) by mouth daily with breakfast.   METOPROLOL TARTRATE (LOPRESSOR) 25 MG TABLET    Take 12.5 mg by mouth 2 (two) times daily.   POLYETHYLENE GLYCOL (MIRALAX / GLYCOLAX) PACKET    Take 17 g by mouth daily.  Modified Medications   No medications on file  Discontinued Medications   ROSUVASTATIN (CRESTOR) 20 MG TABLET    Take 20 mg by mouth at bedtime.     Physical Exam: Filed Vitals:   07/04/13 0939  BP: 124/72  Pulse: 72  Temp: 97.4 F (36.3 C)  Resp: 18  SpO2: 96%   Constitutional: She appears well-developed and well-nourished.  Neck: Neck supple. No JVD present. No thyromegaly present.  Cardiovascular: Normal rate, regular rhythm and intact distal pulses.   Respiratory: Effort normal. No respiratory distress. She has no wheezes.  GI: Soft. Bowel sounds are normal. She exhibits no distension. There is no tenderness.  Musculoskeletal: Normal range of motion. She exhibits no edema.  Neurological: She is alert.  Skin: Skin is warm and dry.  Psychiatric: She has a normal mood and affect.    Labs reviewed: Basic Metabolic Panel:  Recent Labs  40/98/11 2330 12/26/12 0541 01/04/13 1713 01/05/13 0855 01/06/13 0010  NA 137 143 138  140 137  K 4.7 4.1 4.5 4.0 3.8  CL 101 104 99 100 101  CO2 32 28 31 32 31  GLUCOSE 237* 101* 102* 94 112*  BUN 23 24* 24* 20 19  CREATININE 0.82 0.81 0.79 0.77  0.76 0.86  CALCIUM 9.0 8.5 8.9 8.4 7.8*  MG  --  2.1  --  2.1  --   PHOS  --  3.8  --   --   --    Liver Function Tests:  Recent Labs  12/26/12 0541 01/04/13 1713  AST 14 24  ALT 9 16  ALKPHOS 48 63  BILITOT 0.6 0.8  PROT 5.6* 5.9*  ALBUMIN 2.8* 2.6*   No results found for this basename: LIPASE, AMYLASE,  in the last 8760 hours  Recent Labs  01/05/13 0855  AMMONIA 15   CBC:  Recent Labs  12/25/12 2330  01/05/13 0855 01/06/13 0010 01/06/13 0734  WBC 11.8*  < > 6.1 6.6 6.8  NEUTROABS  10.2*  --   --   --   --   HGB 12.3  < > 10.5* 10.4* 10.6*  HCT 39.2  < > 33.9* 33.1* 34.0*  MCV 81.2  < > 83.1 83.0 82.3  PLT 184  < > 267 255 268  < > = values in this interval not displayed. Cardiac Enzymes:  Recent Labs  01/05/13 0855 01/05/13 1437 01/05/13 1936  TROPONINI <0.30 <0.30 <0.30   BNP: No components found with this basename: POCBNP,  CBG:  Recent Labs  12/28/12 0749 12/28/12 1230 01/04/13 1724  GLUCAP 86 104* 99   01-11-13; wbc 7.7 ;hgb 9.8; hct 31.2 ;mcv 81.5; plkt 293; glucose 85; bun 13; creat0.64;  K+ 4.5 Na++ 139; chol 106; ldl 57; trig 65; hgb a1c 6.5; vit d 33 02-15-13; urine culture: no growth  06-15-13: wbc 4.3, hb9.7, hct 34.2, plt 218, na 141, k 4, bun 16, cr 0.7, glu 91, ca 8.5, lipid panel wnl, a1c 6.3, vit d 43.06   ASSESSMENT/ PLAN:  Type II diabetes mellitus  Well controlled. Off metformin for now. Monitor a1c periodically  Hypertension continue lopressor 12.5 mg twice daily and asa 81 mg daily. Monitor vital signs  Osteoporosis, unspecified Will continue her fosamax 70 mg weekly and calcium and vit d supplements. Fall precautions  Constipation Continue current bowel regimen of senna and miralax  Dyslipidemia ldl at goal. Continue crestor  Chronic respiratory failure On O2 continuous. Will montor

## 2013-08-21 ENCOUNTER — Ambulatory Visit: Payer: Self-pay | Admitting: Orthopedic Surgery

## 2013-08-21 LAB — URINALYSIS, COMPLETE
Blood: NEGATIVE
Glucose,UR: NEGATIVE mg/dL (ref 0–75)
Ketone: NEGATIVE
Nitrite: NEGATIVE
Protein: NEGATIVE
Specific Gravity: 1.018 (ref 1.003–1.030)
WBC UR: <1 /HPF (ref 0–5)

## 2013-08-21 LAB — BASIC METABOLIC PANEL
Anion Gap: 2 — ABNORMAL LOW (ref 7–16)
BUN: 15 mg/dL (ref 7–18)
Calcium, Total: 8.2 mg/dL — ABNORMAL LOW (ref 8.5–10.1)
Chloride: 95 mmol/L — ABNORMAL LOW (ref 98–107)
Co2: 37 mmol/L — ABNORMAL HIGH (ref 21–32)
Creatinine: 0.9 mg/dL (ref 0.60–1.30)
EGFR (Non-African Amer.): 56 — ABNORMAL LOW
Osmolality: 271 (ref 275–301)

## 2013-08-21 LAB — PRO B NATRIURETIC PEPTIDE: B-Type Natriuretic Peptide: 901 pg/mL — ABNORMAL HIGH (ref 0–450)

## 2013-08-21 LAB — CBC WITH DIFFERENTIAL/PLATELET
Basophil %: 0.8 %
Eosinophil %: 0 %
Lymphocyte #: 0.7 10*3/uL — ABNORMAL LOW (ref 1.0–3.6)
Lymphocyte %: 8.9 %
MCH: 22.9 pg — ABNORMAL LOW (ref 26.0–34.0)
Monocyte #: 0.8 x10 3/mm (ref 0.2–0.9)
Monocyte %: 11.1 %
Neutrophil %: 79.2 %
RDW: 16.5 % — ABNORMAL HIGH (ref 11.5–14.5)

## 2013-08-23 ENCOUNTER — Emergency Department (HOSPITAL_COMMUNITY): Payer: PRIVATE HEALTH INSURANCE

## 2013-08-23 ENCOUNTER — Inpatient Hospital Stay (HOSPITAL_COMMUNITY)
Admission: EM | Admit: 2013-08-23 | Discharge: 2013-08-26 | DRG: 194 | Disposition: A | Discharge status: Skilled Nursing Facility | Payer: PRIVATE HEALTH INSURANCE | Attending: Internal Medicine | Admitting: Internal Medicine

## 2013-08-23 ENCOUNTER — Encounter (HOSPITAL_COMMUNITY): Payer: Self-pay | Admitting: Emergency Medicine

## 2013-08-23 DIAGNOSIS — E119 Type 2 diabetes mellitus without complications: Secondary | ICD-10-CM | POA: Diagnosis present

## 2013-08-23 DIAGNOSIS — J9600 Acute respiratory failure, unspecified whether with hypoxia or hypercapnia: Secondary | ICD-10-CM | POA: Diagnosis present

## 2013-08-23 DIAGNOSIS — Z87442 Personal history of urinary calculi: Secondary | ICD-10-CM

## 2013-08-23 DIAGNOSIS — Z87891 Personal history of nicotine dependence: Secondary | ICD-10-CM

## 2013-08-23 DIAGNOSIS — E785 Hyperlipidemia, unspecified: Secondary | ICD-10-CM

## 2013-08-23 DIAGNOSIS — J9610 Chronic respiratory failure, unspecified whether with hypoxia or hypercapnia: Secondary | ICD-10-CM

## 2013-08-23 DIAGNOSIS — R410 Disorientation, unspecified: Secondary | ICD-10-CM

## 2013-08-23 DIAGNOSIS — Z79899 Other long term (current) drug therapy: Secondary | ICD-10-CM

## 2013-08-23 DIAGNOSIS — Z8673 Personal history of transient ischemic attack (TIA), and cerebral infarction without residual deficits: Secondary | ICD-10-CM

## 2013-08-23 DIAGNOSIS — F29 Unspecified psychosis not due to a substance or known physiological condition: Secondary | ICD-10-CM

## 2013-08-23 DIAGNOSIS — J189 Pneumonia, unspecified organism: Principal | ICD-10-CM

## 2013-08-23 DIAGNOSIS — Z66 Do not resuscitate: Secondary | ICD-10-CM | POA: Diagnosis present

## 2013-08-23 DIAGNOSIS — E7800 Pure hypercholesterolemia, unspecified: Secondary | ICD-10-CM | POA: Diagnosis present

## 2013-08-23 DIAGNOSIS — I10 Essential (primary) hypertension: Secondary | ICD-10-CM

## 2013-08-23 HISTORY — DX: Type 2 diabetes mellitus without complications: E11.9

## 2013-08-23 HISTORY — DX: Hypoxemia: R09.02

## 2013-08-23 HISTORY — DX: Cerebral infarction, unspecified: I63.9

## 2013-08-23 LAB — CBC WITH DIFFERENTIAL/PLATELET
Basophils Relative: 0 % (ref 0–1)
Eosinophils Relative: 0 % (ref 0–5)
HCT: 38.3 % (ref 36.0–46.0)
Hemoglobin: 11.5 g/dL — ABNORMAL LOW (ref 12.0–15.0)
Lymphs Abs: 1.5 10*3/uL (ref 0.7–4.0)
MCH: 22.4 pg — ABNORMAL LOW (ref 26.0–34.0)
MCV: 74.7 fL — ABNORMAL LOW (ref 78.0–100.0)
Monocytes Absolute: 1 10*3/uL (ref 0.1–1.0)
Monocytes Relative: 14 % — ABNORMAL HIGH (ref 3–12)
Neutro Abs: 4.5 10*3/uL (ref 1.7–7.7)
Platelets: 264 10*3/uL (ref 150–400)
RBC: 5.13 MIL/uL — ABNORMAL HIGH (ref 3.87–5.11)
WBC: 7 10*3/uL (ref 4.0–10.5)

## 2013-08-23 LAB — URINALYSIS W MICROSCOPIC + REFLEX CULTURE
Hgb urine dipstick: NEGATIVE
Nitrite: NEGATIVE
Protein, ur: NEGATIVE mg/dL
Specific Gravity, Urine: 1.027 (ref 1.005–1.030)
Urobilinogen, UA: 0.2 mg/dL (ref 0.0–1.0)

## 2013-08-23 LAB — COMPREHENSIVE METABOLIC PANEL
AST: 18 U/L (ref 0–37)
BUN: 25 mg/dL — ABNORMAL HIGH (ref 6–23)
CO2: 39 mEq/L — ABNORMAL HIGH (ref 19–32)
Calcium: 9.1 mg/dL (ref 8.4–10.5)
Chloride: 90 mEq/L — ABNORMAL LOW (ref 96–112)
Creatinine, Ser: 0.85 mg/dL (ref 0.50–1.10)
GFR calc Af Amer: 67 mL/min — ABNORMAL LOW (ref >90)
GFR calc non Af Amer: 58 mL/min — ABNORMAL LOW (ref >90)
Glucose, Bld: 109 mg/dL — ABNORMAL HIGH (ref 70–99)
Total Bilirubin: 0.4 mg/dL (ref 0.3–1.2)

## 2013-08-23 LAB — PRO B NATRIURETIC PEPTIDE: Pro B Natriuretic peptide (BNP): 184.6 pg/mL (ref 0–450)

## 2013-08-23 MED ORDER — ASPIRIN EC 81 MG PO TBEC
81.0000 mg | DELAYED_RELEASE_TABLET | Freq: Every day | ORAL | Status: DC
  Administered 2013-08-24 – 2013-08-26 (×3): 81 mg via ORAL
  Filled 2013-08-23 (×3): qty 1

## 2013-08-23 MED ORDER — DSS 100 MG PO CAPS: 100.0000 mg | ORAL_CAPSULE | Freq: Two times a day (BID) | ORAL | Status: DC

## 2013-08-23 MED ORDER — ONDANSETRON HCL 4 MG/2ML IJ SOLN: 4.0000 mg | Freq: Four times a day (QID) | INTRAMUSCULAR | Status: DC | PRN

## 2013-08-23 MED ORDER — METOPROLOL TARTRATE 12.5 MG HALF TABLET
12.5000 mg | ORAL_TABLET | Freq: Two times a day (BID) | ORAL | Status: DC
  Administered 2013-08-24 – 2013-08-26 (×5): 12.5 mg via ORAL
  Filled 2013-08-23 (×7): qty 1

## 2013-08-23 MED ORDER — PANTOPRAZOLE SODIUM 40 MG PO TBEC
40.0000 mg | DELAYED_RELEASE_TABLET | Freq: Every day | ORAL | Status: DC
  Administered 2013-08-24 – 2013-08-26 (×3): 40 mg via ORAL
  Filled 2013-08-23 (×3): qty 1

## 2013-08-23 MED ORDER — SODIUM CHLORIDE 0.9 % IV BOLUS (SEPSIS)
500.0000 mL | Freq: Once | INTRAVENOUS | Status: AC
  Administered 2013-08-23: 500 mL via INTRAVENOUS

## 2013-08-23 MED ORDER — DEXTROSE 5 % IV SOLN
1.0000 g | INTRAVENOUS | Status: DC
  Administered 2013-08-23 – 2013-08-25 (×3): 1 g via INTRAVENOUS
  Filled 2013-08-23 (×4): qty 1

## 2013-08-23 MED ORDER — IPRATROPIUM BROMIDE 0.02 % IN SOLN
0.5000 mg | RESPIRATORY_TRACT | Status: DC | PRN
  Administered 2013-08-25: 0.5 mg via RESPIRATORY_TRACT
  Filled 2013-08-23: qty 2.5

## 2013-08-23 MED ORDER — VANCOMYCIN HCL IN DEXTROSE 1-5 GM/200ML-% IV SOLN
1000.0000 mg | INTRAVENOUS | Status: AC
  Administered 2013-08-23: 1000 mg via INTRAVENOUS
  Filled 2013-08-23: qty 200

## 2013-08-23 MED ORDER — VANCOMYCIN HCL IN DEXTROSE 750-5 MG/150ML-% IV SOLN
750.0000 mg | Freq: Two times a day (BID) | INTRAVENOUS | Status: DC
  Administered 2013-08-24 – 2013-08-26 (×5): 750 mg via INTRAVENOUS
  Filled 2013-08-23 (×6): qty 150

## 2013-08-23 MED ORDER — BIOTENE DRY MOUTH MT LIQD
15.0000 mL | Freq: Two times a day (BID) | OROMUCOSAL | Status: DC
  Administered 2013-08-23 – 2013-08-26 (×6): 15 mL via OROMUCOSAL

## 2013-08-23 MED ORDER — POLYETHYLENE GLYCOL 3350 17 G PO PACK
17.0000 g | PACK | Freq: Every day | ORAL | Status: DC
  Administered 2013-08-24 – 2013-08-26 (×3): 17 g via ORAL
  Filled 2013-08-23 (×3): qty 1

## 2013-08-23 MED ORDER — DOCUSATE SODIUM 100 MG PO CAPS
100.0000 mg | ORAL_CAPSULE | Freq: Two times a day (BID) | ORAL | Status: DC
  Administered 2013-08-24 – 2013-08-26 (×5): 100 mg via ORAL
  Filled 2013-08-23 (×7): qty 1

## 2013-08-23 MED ORDER — ALBUTEROL SULFATE (5 MG/ML) 0.5% IN NEBU
2.5000 mg | INHALATION_SOLUTION | RESPIRATORY_TRACT | Status: DC | PRN
  Administered 2013-08-25: 2.5 mg via RESPIRATORY_TRACT
  Filled 2013-08-23: qty 0.5

## 2013-08-23 MED ORDER — GUAIFENESIN-DM 100-10 MG/5ML PO SYRP
5.0000 mL | ORAL_SOLUTION | ORAL | Status: DC | PRN
  Administered 2013-08-24 – 2013-08-25 (×3): 5 mL via ORAL
  Filled 2013-08-23 (×3): qty 10

## 2013-08-23 MED ORDER — PIPERACILLIN-TAZOBACTAM 3.375 G IVPB 30 MIN
3.3750 g | INTRAVENOUS | Status: AC
  Administered 2013-08-23: 3.375 g via INTRAVENOUS
  Filled 2013-08-23: qty 50

## 2013-08-23 MED ORDER — SODIUM CHLORIDE 0.9 % IV SOLN
INTRAVENOUS | Status: AC
  Administered 2013-08-23 (×2): via INTRAVENOUS

## 2013-08-23 MED ORDER — ENOXAPARIN SODIUM 40 MG/0.4ML ~~LOC~~ SOLN
40.0000 mg | SUBCUTANEOUS | Status: DC
  Administered 2013-08-23 – 2013-08-25 (×3): 40 mg via SUBCUTANEOUS
  Filled 2013-08-23 (×4): qty 0.4

## 2013-08-23 MED ORDER — IPRATROPIUM-ALBUTEROL 0.5-2.5 (3) MG/3ML IN SOLN: 3.0000 mL | RESPIRATORY_TRACT | Status: DC | PRN

## 2013-08-23 NOTE — H&P (Signed)
[facility] History and Physical  Savannah [name] ZOX:[id number] DOB: 17-May-1919 DOA: 08/23/2013  Referring physician: ED physician PCP: No primary provider on file.    Chief Complaint: shortness of breath and confusion   HPI:  Patient is 79 year old female who was brought from skilled nursing facility, [facility] by her family with main concern of progressively worsening confusion that was initially noted four days prior to admission. Per family, patient has been recently diagnosed with pneumonia and treated with antibiotic of which they do not know the name. They have not noticed significant improvement but patient appeared to be more confused and was talking about things that did not make sense. In addition there has been reported fever, poor oral intake, more lethargy at facility where she lives. Patient is currently not able to provide history of due to somnolence but is easy to arouse with verbal stimuli. There is no report of abdominal urinary concerns, no chest pain but it was noted that patient appeared more short of breath with exertion and associated with productive sputum.  In ED, pt noted to be somnolent but easy to arouse, CXR with worrisome finding of PNA. TR[facility] asked to admit for further evaluation and management and telemetry bed requested.   Assessment and Plan: Active Problems:   Confusion - most likely secondary to PNA - will admit to telemetry bed and will obtain sputum culture, gram stain - PT evaluation once more alert and awake - urine and blood culture ordered - ABX as noted below    HCAP (healthcare-associated pneumonia) - place on broad spectrum ABX for now - follow up on sputum analysis - urine legionella and strep pneumo ordered  - provide supportive care with nebulizer as needed, oxygen, antiemetics as needed  Code Status: Full Family Communication: Family at bedside  Disposition Plan: PT evaluation   Review of Systems:  Constitutional: Negative for  diaphoresis.  HENT: Negative for hearing loss, ear pain, nosebleeds, congestion, sore throat, neck pain, tinnitus and ear discharge.   Eyes: Negative for blurred vision, double vision, photophobia, pain, discharge and redness.  Respiratory: Negative for hemoptysis, wheezing and stridor.   Cardiovascular: Negative for chest pain, palpitations, orthopnea, claudication and leg swelling.  Gastrointestinal: Negative for nausea, vomiting and abdominal pain. Negative for heartburn, constipation, blood in stool and melena.  Genitourinary: Negative for dysuria, urgency, frequency, hematuria and flank pain.  Musculoskeletal: Negative for myalgias, back pain, joint pain and falls.  Skin: Negative for itching and rash.  Neurological: Negative for tingling, tremors, sensory change, speech change, focal weakness, loss of consciousness and headaches.  Endo/Heme/Allergies: Negative for environmental allergies and polydipsia. Does not bruise/bleed easily.  Psychiatric/Behavioral: Negative for suicidal ideas. The patient is not nervous/anxious.      Past Medical History  Diagnosis Date  . Hypertension   . Hypercholesteremia   . Pelvis fracture   . Kidney stones   . Hyperglycemia   . Diabetes mellitus without complication   . CVA (cerebral infarction)   . Hypoxia     Past Surgical History  Procedure Laterality Date  . Abdominal hysterectomy      Social History:  reports that she has quit smoking. She does not have any smokeless tobacco history on file. She reports that she does not drink alcohol or use illicit drugs.  No Known Allergies  Family History  Problem Relation Age of Onset  . Heart disease Mother   . Heart disease Father   . Diabetes Sister   . Stroke Brother  Prior to Admission medications   Medication Sig Start Date End Date Taking? Authorizing Provider  aspirin EC 81 MG tablet Take 81 mg by mouth daily.   Yes Historical Provider, MD  beta carotene w/minerals (OCUVITE)  tablet Take 1 tablet by mouth daily.   Yes Historical Provider, MD  calcium-vitamin D (OSCAL WITH D) 500-200 MG-UNIT per tablet Take 1 tablet by mouth daily.   Yes Historical Provider, MD  Cholecalciferol (VITAMIN D-3) 1000 UNITS CAPS Take 1,000 Units by mouth daily.   Yes Historical Provider, MD  Docusate Sodium (DSS) 100 MG CAPS Take 100 mg by mouth 2 (two) times daily. 12/28/12  Yes Marianne L York, PA-C  ipratropium-albuterol (DUONEB) 0.5-2.5 (3) MG/3ML SOLN Take 3 mLs by nebulization every 4 (four) hours as needed.   Yes Historical Provider, MD  metoprolol tartrate (LOPRESSOR) 25 MG tablet Take 12.5 mg by mouth 2 (two) times daily.   Yes Historical Provider, MD  omeprazole (PRILOSEC) 20 MG capsule Take 20 mg by mouth daily.   Yes Historical Provider, MD  polyethylene glycol (MIRALAX / GLYCOLAX) packet Take 17 g by mouth daily. 12/28/12  Yes Stephani Police, PA-C    Physical Exam: Filed Vitals:   08/23/13 1541  BP: 140/87  Pulse: 80  Temp: 98.7 F (37.1 C)  TempSrc: Oral  Resp: 20  SpO2: 99%    Physical Exam  Constitutional: Appears somnolent but not in acute distress HENT: Normocephalic. External right and left ear normal. Dry MM Eyes: Conjunctivae and EOM are normal. PERRLA, no scleral icterus.  Neck: Normal ROM. Neck supple. No JVD. No tracheal deviation. No thyromegaly.  CVS: RRR, S1/S2 +, no murmurs, no gallops, no carotid bruit.  Pulmonary: Effort and breath sounds normal, diminished breath sounds at bases with bilateral rhonchi   Abdominal: Soft. BS +,  no distension, tenderness, rebound or guarding.  Musculoskeletal: Normal range of motion. No edema and no tenderness.  Lymphadenopathy: No lymphadenopathy noted, cervical, inguinal. Neuro: When awake, orient x3, follows commands appropriately when awake.  Skin: Skin is warm and dry. No rash noted. Not diaphoretic. No erythema. No pallor.  Psychiatric: Normal mood and affect.   Labs on Admission:  Basic Metabolic  Panel:  Recent Labs Lab 08/23/13 1705  NA 136  K 3.7  CL 90*  CO2 39*  GLUCOSE 109*  BUN 25*  CREATININE 0.85  CALCIUM 9.1   Liver Function Tests:  Recent Labs Lab 08/23/13 1705  AST 18  ALT 13  ALKPHOS 52  BILITOT 0.4  PROT 6.1  ALBUMIN 2.9*   CBC:  Recent Labs Lab 08/23/13 1705  WBC 7.0  NEUTROABS 4.5  HGB 11.5*  HCT 38.3  MCV 74.7*  PLT 264    Radiological Exams on Admission: Dg Chest 2 View  08/23/2013  1. Right apical density may be partially projectional due to rotation, but I cannot exclude pneumonia or mass at the right lung apex based on this image.  2. Large hiatal hernia  3. Cardiomegaly  4. Atherosclerosis  5. Bilateral lower lobe mild airspace opacities, probably atelectasis although technically nonspecific. Small bilateral pleural effusions.     Ct Head Wo Contrast  08/23/2013   Atrophy and chronic ischemia. No acute intracranial abnormality.  Sinusitis with air-fluid level.    EKG: Normal sinus rhythm, no ST/T wave changes  Debbora Presto, MD  [facility] Pager [phone]  If 7PM-7AM, please contact night-coverage [email or web address] Password [email or web address] 08/23/2013, 6:22 PM

## 2013-08-23 NOTE — ED Notes (Signed)
Bed: WA12 Expected date:  Expected time:  Means of arrival:  Comments: ems 

## 2013-08-23 NOTE — Progress Notes (Signed)
ANTIBIOTIC CONSULT NOTE - INITIAL  Pharmacy Consult for Vancomycin, renal adj of antibiotics Indication: PNA  No Known Allergies  Patient Measurements: Height: 5' 2.99" (160 cm) Weight: 162 lb 4.1 oz (73.6 kg) IBW/kg (Calculated) : 52.38  Vital Signs: Temp: 97.7 F (36.5 C) (10/14 1900) Temp src: Axillary (10/14 1900) BP: 140/102 mmHg (10/14 1900) Pulse Rate: 69 (10/14 1900) Intake/Output from previous day:   Intake/Output from this shift:    Labs:  Recent Labs  08/23/13 1705  WBC 7.0  HGB 11.5*  PLT 264  CREATININE 0.85   Estimated Creatinine Clearance: 40.6 ml/min (by C-G formula based on Cr of 0.85). No results found for this basename: VANCOTROUGH, VANCOPEAK, VANCORANDOM, GENTTROUGH, GENTPEAK, GENTRANDOM, TOBRATROUGH, TOBRAPEAK, TOBRARND, AMIKACINPEAK, AMIKACINTROU, AMIKACIN,  in the last 72 hours   Medical History: Past Medical History  Diagnosis Date  . Hypertension   . Hypercholesteremia   . Pelvis fracture   . Kidney stones   . Hyperglycemia   . Diabetes mellitus without complication   . CVA (cerebral infarction)   . Hypoxia     Assessment: 44 [facility] 10/14 with worsening confusion.  Patient was recently diagnosed with PNA, start on abx (?name) but abx stopped given increased confusion. CXR in ED with R apical density and bilateral lower lobe mild airspace opacities.  V/Z ordered x 1 in the ED. Now MD order to continue with Vancomycin and Cefepime.   10/14 Zosyn x 1  10/14 >> Vanc >> 10/14 >> Cefepime (MD) >>  Tmax: afeb WBCs: wnl Renal: Scr 0.85, CG 41, N 48  10/14 MRSA PCR >> ordered 10/14 Sputum >> ordered 10/14 Legionella/Strep pneumo ag >> ordered 10/14 blood x 2 >> ordered 10/14 urine >> ordered  Goal of Therapy:  Vancomycin trough level 15-20 mcg/ml  Plan:   Vancomycin 750 mg IV q12h x 15 doses (8day total)  Change Cefepime to 1g IV q24h x 8 days  Pharmacy will f/u  Geoffry Paradise, PharmD, BCPS Pager:  [phone] 7:19 PM Pharmacy #: [id number]

## 2013-08-23 NOTE — ED Provider Notes (Signed)
CSN: [id number]     Arrival date & time 08/23/13  1515 History   First MD Initiated Contact with Patient 08/23/13 1555     Chief Complaint  Patient presents with  . Altered Mental Status   (Consider location/radiation/quality/duration/timing/severity/associated sxs/prior Treatment) Patient is a 79 y.o. female presenting with altered mental status. The history is provided by the patient.  Altered Mental Status Presenting symptoms: behavior changes   Severity:  Mild Most recent episode: 3-4 days ago. Episode history:  Continuous Duration:  4 days Timing:  Constant Progression:  Worsening Chronicity:  New Associated symptoms: no abdominal pain, no fever, no headaches, no nausea and no vomiting     Past Medical History  Diagnosis Date  . Hypertension   . Hypercholesteremia   . Pelvis fracture   . Kidney stones   . Hyperglycemia   . Diabetes mellitus without complication   . CVA (cerebral infarction)   . Hypoxia    Past Surgical History  Procedure Laterality Date  . Abdominal hysterectomy     Family History  Problem Relation Age of Onset  . Heart disease Mother   . Heart disease Father   . Diabetes Sister   . Stroke Brother    History  Substance Use Topics  . Smoking status: Former Games developer  . Smokeless tobacco: Not on file  . Alcohol Use: No   OB History   Grav Para Term Preterm Abortions TAB SAB Ect Mult Living                 Review of Systems  Constitutional: Negative for fever and fatigue.  HENT: Negative for congestion and drooling.   Eyes: Negative for pain.  Respiratory: Negative for cough and shortness of breath.   Cardiovascular: Negative for chest pain.  Gastrointestinal: Negative for nausea, vomiting, abdominal pain and diarrhea.  Genitourinary: Negative for dysuria and hematuria.  Musculoskeletal: Negative for back pain, gait problem and neck pain.  Skin: Negative for color change.  Neurological: Negative for dizziness and headaches.   Hematological: Negative for adenopathy.  Psychiatric/Behavioral: Negative for behavioral problems.  All other systems reviewed and are negative.    Allergies  Review of patient's allergies indicates no known allergies.  Home Medications   Current Outpatient Rx  Name  Route  Sig  Dispense  Refill  . aspirin EC 81 MG tablet   Oral   Take 81 mg by mouth daily.         . beta carotene w/minerals (OCUVITE) tablet   Oral   Take 1 tablet by mouth daily.         . calcium-vitamin D (OSCAL WITH D) 500-200 MG-UNIT per tablet   Oral   Take 1 tablet by mouth daily.         . Cholecalciferol (VITAMIN D-3) 1000 UNITS CAPS   Oral   Take 1,000 Units by mouth daily.         Tery Sanfilippo Sodium (DSS) 100 MG CAPS   Oral   Take 100 mg by mouth 2 (two) times daily.         Marland Kitchen ipratropium-albuterol (DUONEB) 0.5-2.5 (3) MG/3ML SOLN   Nebulization   Take 3 mLs by nebulization every 4 (four) hours as needed.         . metoprolol tartrate (LOPRESSOR) 25 MG tablet   Oral   Take 12.5 mg by mouth 2 (two) times daily.         Marland Kitchen omeprazole (PRILOSEC) 20 MG capsule  Oral   Take 20 mg by mouth daily.         . polyethylene glycol (MIRALAX / GLYCOLAX) packet   Oral   Take 17 g by mouth daily.   14 each   0    BP 140/87  Pulse 80  Temp(Src) 98.7 F (37.1 C) (Oral)  Resp 20  SpO2 99% Physical Exam  Nursing note and vitals reviewed. Constitutional: She is oriented to person, place, and time. She appears well-developed and well-nourished.  HENT:  Head: Normocephalic.  Mouth/Throat: Oropharynx is clear and moist. No oropharyngeal exudate.  Eyes: Conjunctivae and EOM are normal. Pupils are equal, round, and reactive to light.  Neck: Normal range of motion. Neck supple.  Cardiovascular: Normal rate, regular rhythm, normal heart sounds and intact distal pulses.  Exam reveals no gallop and no friction rub.   No murmur heard. Pulmonary/Chest: Effort normal and breath sounds  normal. No respiratory distress. She has no wheezes.  Abdominal: Soft. Bowel sounds are normal. There is no tenderness. There is no rebound and no guarding.  Musculoskeletal: Normal range of motion. She exhibits no edema and no tenderness.  Neurological: She is alert and oriented to person, place, and time.  Moving all extremities. Slight facial droop on the right, family does not think this is new.   Skin: Skin is warm and dry.  Psychiatric: She has a normal mood and affect. Her behavior is normal.    ED Course  Procedures (including critical care time) Labs Review Labs Reviewed  MRSA PCR SCREENING - Abnormal; Notable for the following:    MRSA by PCR POSITIVE (*)    All other components within normal limits  CBC WITH DIFFERENTIAL - Abnormal; Notable for the following:    RBC 5.13 (*)    Hemoglobin 11.5 (*)    MCV 74.7 (*)    MCH 22.4 (*)    RDW 15.6 (*)    Monocytes Relative 14 (*)    All other components within normal limits  COMPREHENSIVE METABOLIC PANEL - Abnormal; Notable for the following:    Chloride 90 (*)    CO2 39 (*)    Glucose, Bld 109 (*)    BUN 25 (*)    Albumin 2.9 (*)    GFR calc non Af Amer 58 (*)    GFR calc Af Amer 67 (*)    All other components within normal limits  BASIC METABOLIC PANEL - Abnormal; Notable for the following:    Chloride 95 (*)    CO2 39 (*)    Glucose, Bld 102 (*)    GFR calc non Af Amer 70 (*)    GFR calc Af Amer 82 (*)    All other components within normal limits  CBC - Abnormal; Notable for the following:    Hemoglobin 10.5 (*)    HCT 35.8 (*)    MCV 75.5 (*)    MCH 22.2 (*)    MCHC 29.3 (*)    RDW 15.7 (*)    All other components within normal limits  URINE CULTURE  CULTURE, BLOOD (ROUTINE X 2)  CULTURE, BLOOD (ROUTINE X 2)  CULTURE, EXPECTORATED SPUTUM-ASSESSMENT  GRAM STAIN  URINALYSIS W MICROSCOPIC + REFLEX CULTURE  LEGIONELLA ANTIGEN, URINE  STREP PNEUMONIAE URINARY ANTIGEN  TSH  PRO B NATRIURETIC PEPTIDE    Imaging Review Dg Chest 2 View  08/23/2013   CLINICAL DATA:  Altered mental status.  EXAM: CHEST  2 VIEW  COMPARISON:  01/04/2013  FINDINGS: Right  apical density may partially be from rightward rotation causing projection of mediastinal structures, but I cannot exclude mass or pneumonia in this vicinity.  Atherosclerotic aortic arch noted with mild cardiomegaly. Large hiatal hernia noted.  Suspected small pleural effusions with indistinct lower lobe airspace opacities posteriorly. Bony demineralization noted.  IMPRESSION: 1. Right apical density may be partially projectional due to rotation, but I cannot exclude pneumonia or mass at the right lung apex based on this image. Followup chest radiography is recommended to reassess this region. 2. Large hiatal hernia 3. Cardiomegaly 4. Atherosclerosis 5. Bilateral lower lobe mild airspace opacities, probably atelectasis although technically nonspecific. Small bilateral pleural effusions.   Electronically Signed   By: Herbie Baltimore M.D.   On: 08/23/2013 17:03   Ct Head Wo Contrast  08/23/2013   CLINICAL DATA:  Altered mental status  EXAM: CT HEAD WITHOUT CONTRAST  TECHNIQUE: Contiguous axial images were obtained from the base of the skull through the vertex without intravenous contrast.  COMPARISON:  CT 01/04/2013  FINDINGS: Moderate atrophy. Chronic microvascular ischemic changes are stable. Chronic infarct left caudate is stable.  Negative for acute infarct. Negative for hemorrhage or mass lesion.  Air-fluid level left maxillary sinus. Right mastoid sinus effusion.  IMPRESSION: Atrophy and chronic ischemia. No acute intracranial abnormality.  Sinusitis with air-fluid level.   Electronically Signed   By: Marlan Palau M.D.   On: 08/23/2013 17:09    EKG Interpretation     Ventricular Rate:  75 PR Interval:  184 QRS Duration: 88 QT Interval:  458 QTC Calculation: 512 R Axis:   46 Text Interpretation:  Sinus rhythm Abnormal R-wave progression,  early transition Prolonged QT interval Otherwise no significant change            MDM   1. HCAP (healthcare-associated pneumonia)   2. Confusion   3. Chronic respiratory failure    4:25 PM 79 y.o. female w hx of CVA, DM, HTN who presents with gradually worsening mental status over the last 4 days. The family notes that the patient was diagnosed with pneumonia last week at her facility. They know she's been being treated for this at her facility but do not know what antibiotic she is receiving. She was noted to have mild confusion 3 days ago which has gradually worsened. Today the staff noted that she was increasingly drowsy and required arousal to respond. She is afebrile and vital signs are unremarkable here. She does appear drowsy on exam but is easily aroused. She is alert and oriented x3 on exam. Will get screening labwork, chest x-ray, CT head.  Will admit for HCAP. Will tx empirically w/ Vanc/Zosyn as I do not know her abx regimen at her facility.     Junius Argyle, MD 08/24/13 1325

## 2013-08-23 NOTE — ED Notes (Signed)
Per EMS: pt from [facility], called out for unresponsive pt But pt responded to pain. Pt was on antibiotic but they stopped this past weekend because they thought medication was making pt more confused. Pt states she feels normal, doesn't know why she is here and has no complaints.

## 2013-08-24 ENCOUNTER — Inpatient Hospital Stay (HOSPITAL_COMMUNITY): Payer: PRIVATE HEALTH INSURANCE

## 2013-08-24 DIAGNOSIS — IMO0001 Reserved for inherently not codable concepts without codable children: Secondary | ICD-10-CM

## 2013-08-24 DIAGNOSIS — I10 Essential (primary) hypertension: Secondary | ICD-10-CM

## 2013-08-24 LAB — LEGIONELLA ANTIGEN, URINE: Legionella Antigen, Urine: NEGATIVE

## 2013-08-24 LAB — CBC
Hemoglobin: 10.5 g/dL — ABNORMAL LOW (ref 12.0–15.0)
Platelets: 224 10*3/uL (ref 150–400)
RBC: 4.74 MIL/uL (ref 3.87–5.11)
WBC: 6.1 10*3/uL (ref 4.0–10.5)

## 2013-08-24 LAB — BASIC METABOLIC PANEL
BUN: 20 mg/dL (ref 6–23)
Calcium: 8.5 mg/dL (ref 8.4–10.5)
GFR calc Af Amer: 82 mL/min — ABNORMAL LOW (ref >90)
GFR calc non Af Amer: 70 mL/min — ABNORMAL LOW (ref >90)
Glucose, Bld: 102 mg/dL — ABNORMAL HIGH (ref 70–99)
Potassium: 3.6 mEq/L (ref 3.5–5.1)
Sodium: 138 mEq/L (ref 135–145)

## 2013-08-24 LAB — STREP PNEUMONIAE URINARY ANTIGEN: Strep Pneumo Urinary Antigen: NEGATIVE

## 2013-08-24 LAB — GLUCOSE, CAPILLARY: Glucose-Capillary: 97 mg/dL (ref 70–99)

## 2013-08-24 LAB — MRSA PCR SCREENING: MRSA by PCR: POSITIVE — AB

## 2013-08-24 LAB — TSH: TSH: 1.846 u[IU]/mL (ref 0.350–4.500)

## 2013-08-24 MED ORDER — MUPIROCIN 2 % EX OINT
1.0000 "application " | TOPICAL_OINTMENT | Freq: Two times a day (BID) | CUTANEOUS | Status: DC
  Administered 2013-08-24 – 2013-08-26 (×6): 1 via NASAL
  Filled 2013-08-24: qty 22

## 2013-08-24 MED ORDER — INFLUENZA VAC SPLIT QUAD 0.5 ML IM SUSP
0.5000 mL | INTRAMUSCULAR | Status: AC
  Filled 2013-08-24 (×2): qty 0.5

## 2013-08-24 MED ORDER — INSULIN ASPART 100 UNIT/ML ~~LOC~~ SOLN
0.0000 [IU] | Freq: Three times a day (TID) | SUBCUTANEOUS | Status: DC
  Administered 2013-08-25: 12:00:00 5 [IU] via SUBCUTANEOUS
  Administered 2013-08-25: 17:00:00 3 [IU] via SUBCUTANEOUS
  Administered 2013-08-26: 5 [IU] via SUBCUTANEOUS
  Administered 2013-08-26: 09:00:00 3 [IU] via SUBCUTANEOUS

## 2013-08-24 MED ORDER — PNEUMOCOCCAL VAC POLYVALENT 25 MCG/0.5ML IJ INJ
0.5000 mL | INJECTION | INTRAMUSCULAR | Status: AC
  Administered 2013-08-25: 11:00:00 0.5 mL via INTRAMUSCULAR
  Filled 2013-08-24 (×2): qty 0.5

## 2013-08-24 MED ORDER — INSULIN ASPART 100 UNIT/ML ~~LOC~~ SOLN
0.0000 [IU] | Freq: Every day | SUBCUTANEOUS | Status: DC
  Administered 2013-08-25: 22:00:00 4 [IU] via SUBCUTANEOUS

## 2013-08-24 MED ORDER — CHLORHEXIDINE GLUCONATE CLOTH 2 % EX PADS
6.0000 | MEDICATED_PAD | Freq: Every day | CUTANEOUS | Status: DC
  Administered 2013-08-24 – 2013-08-26 (×3): 6 via TOPICAL

## 2013-08-24 NOTE — Evaluation (Signed)
Physical Therapy Evaluation Patient Details Name: Savannah [name] MRN: [id number] DOB: [birth year] Today's Date: 08/24/2013 Time: 4098-1191 PT Time Calculation (min): 27 min  PT Assessment / Plan / Recommendation History of Present Illness  Patient is 79 year old female who was brought from skilled nursing facility, [facility] on 08/23/13 by her family with main concern of progressively worsening confusion that was initially noted four days prior to admission. Per family, patient has been recently diagnosed with pneumonia and treated with antibiotic They have not noticed significant improvement but patient appeared to be more confused and was talking about things that did not make sense. In addition there has been reported fever, poor oral intake, more lethargy at facility where she lives. Patient is currently not able to provide history of due to somnolence but is easy to arouse with verbal stimuli.   Clinical Impression  Pt very alert, oriented to place. Asking if she was brought here. Pt able to mobilize to the [facility] with 1 person. Pt will benefit from PT to address problems.     PT Assessment  Patient needs continued PT services    Follow Up Recommendations  SNF    Does the patient have the potential to tolerate intense rehabilitation      Barriers to Discharge        Equipment Recommendations  None recommended by PT    Recommendations for Other Services     Frequency Min 3X/week    Precautions / Restrictions Precautions Precautions: Fall Precaution Comments: ck O2   Pertinent Vitals/Pain sats dtrop to 82 on RA, back to 96% on 2 l.      Mobility  Bed Mobility Bed Mobility: Supine to Sit;Sitting - Scoot to Edge of Bed Supine to Sit: 3: Mod assist Sitting - Scoot to Edge of Bed: 3: Mod assist Details for Bed Mobility Assistance: cues for reaching for rail, Transfers Transfers: Sit to Stand;Stand to [employer] Transfers Sit to Stand: 3: Mod assist;From bed;From  chair/3-in-1;With upper extremity assist Stand to Sit: 3: Mod assist;To chair/3-in-1;To bed;With upper extremity assist Stand Pivot Transfers: 3: Mod assist Details for Transfer Assistance: tactile cues for use of UE , reach for armrest. safety cues. Ambulation/Gait Ambulation/Gait Assistance: Not tested (comment)    Exercises     PT Diagnosis: Difficulty walking;Generalized weakness  PT Problem List: Decreased strength;Decreased activity tolerance;Decreased mobility;Decreased knowledge of use of DME;Decreased knowledge of precautions PT Treatment Interventions: DME instruction;Gait training;Functional mobility training;Therapeutic activities;Therapeutic exercise;Patient/family education     PT Goals(Current goals can be found in the care plan section) Acute Rehab PT Goals Patient Stated Goal: I want to get to the bathroom. PT Goal Formulation: Patient unable to participate in goal setting Time For Goal Achievement: 09/07/13 Potential to Achieve Goals: Good  Visit Information  Last PT Received On: 08/24/13 Assistance Needed: +2 (+ 2 to walk) History of Present Illness: Patient is 79 year old female who was brought from skilled nursing facility, [facility] on 08/23/13 by her family with main concern of progressively worsening confusion that was initially noted four days prior to admission. Per family, patient has been recently diagnosed with pneumonia and treated with antibiotic They have not noticed significant improvement but patient appeared to be more confused and was talking about things that did not make sense. In addition there has been reported fever, poor oral intake, more lethargy at facility where she lives. Patient is currently not able to provide history of due to somnolence but is easy to arouse with verbal stimuli.  Prior Functioning  Home Living Family/patient expects to be discharged to:: Skilled nursing facility Additional Comments: from [facility] where she  was recieving rehab after recent pelvic fracture.   Prior Function Level of Independence: Needs assistance Communication Communication: HOH    Cognition  Cognition Arousal/Alertness: Awake/alert Overall Cognitive Status: No family/caregiver present to determine baseline cognitive functioning Area of Impairment: Memory    Extremity/Trunk Assessment Upper Extremity Assessment Upper Extremity Assessment: Generalized weakness Lower Extremity Assessment Lower Extremity Assessment: Generalized weakness Cervical / Trunk Assessment Cervical / Trunk Assessment: Normal   Balance    End of Session PT - End of Session Activity Tolerance: Patient tolerated treatment well Patient left: in chair;with call bell/phone within reach;with chair alarm set  GP     Rada Hay 08/24/2013, 2:45 PM Blanchard Kelch PT [phone]

## 2013-08-24 NOTE — Progress Notes (Signed)
[facility] PROGRESS NOTE  Savannah [name] WUJ:[id number] DOB: 08-Dec-1918 DOA: 08/23/2013 PCP: No primary provider on file.  Assessment/Plan: Confusion  - most likely secondary to PNA - will admit to telemetry bed and will obtain sputum culture, gram stain  - PT evaluation once more alert and awake  - urine and blood culture ordered  - ABX as noted below  HCAP (healthcare-associated pneumonia)  - place on broad spectrum ABX for now  - follow up on sputum analysis  - urine legionella and strep pneumo ordered  - provide supportive care with nebulizer as needed, oxygen, antiemetics as needed DM - will cont on SSI HTN - BP stable - Cont current regimen  Code Status: Full Family Communication: Pt in room (indicate person spoken with, relationship, and if by phone, the number) Disposition Plan: Pending   Antibiotics:  Vancomycin 08/23/13>>>  Zosyn 08/23/13>>>  Cefepime 08/23/13>>>  HPI/Subjective: Reports feeling better  Objective: Filed Vitals:   08/24/13 0923 08/24/13 1350 08/24/13 1352 08/24/13 1356  BP: 141/59 107/49    Pulse: 77 78    Temp:  97.6 F (36.4 C)    TempSrc:  Oral    Resp:      Height:      Weight:      SpO2:  100% 82% 96%    Intake/Output Summary (Last 24 hours) at 08/24/13 1628 Last data filed at 08/24/13 1300  Gross per 24 hour  Intake 1416.67 ml  Output    150 ml  Net 1266.67 ml   Filed Weights   08/23/13 1858 08/24/13 0509  Weight: 73.6 kg (162 lb 4.1 oz) 76.8 kg (169 lb 5 oz)    Exam:   General:  Awake, in nad  Cardiovascular: Regular, s1, s2  Respiratory: normal resp effort, no wheezing  Abdomen: soft, nondistended  Musculoskeletal: perfused, no clubbing   Data Reviewed: Basic Metabolic Panel:  Recent Labs Lab 08/23/13 1705 08/24/13 0545  NA 136 138  K 3.7 3.6  CL 90* 95*  CO2 39* 39*  GLUCOSE 109* 102*  BUN 25* 20  CREATININE 0.85 0.78  CALCIUM 9.1 8.5   Liver Function Tests:  Recent Labs Lab  08/23/13 1705  AST 18  ALT 13  ALKPHOS 52  BILITOT 0.4  PROT 6.1  ALBUMIN 2.9*   No results found for this basename: LIPASE, AMYLASE,  in the last 168 hours No results found for this basename: AMMONIA,  in the last 168 hours CBC:  Recent Labs Lab 08/23/13 1705 08/24/13 0545  WBC 7.0 6.1  NEUTROABS 4.5  --   HGB 11.5* 10.5*  HCT 38.3 35.8*  MCV 74.7* 75.5*  PLT 264 224   Cardiac Enzymes: No results found for this basename: CKTOTAL, CKMB, CKMBINDEX, TROPONINI,  in the last 168 hours BNP (last 3 results)  Recent Labs  08/23/13 2224  PROBNP 184.6   CBG: No results found for this basename: GLUCAP,  in the last 168 hours  Recent Results (from the past 240 hour(s))  MRSA PCR SCREENING     Status: Abnormal   Collection Time    08/23/13  7:11 PM      Result Value Range Status   MRSA by PCR POSITIVE (*) NEGATIVE Final   Comment:            The GeneXpert MRSA Assay (FDA     approved for NASAL specimens     only), is one component of a     comprehensive MRSA colonization  surveillance program. It is not     intended to diagnose MRSA     infection nor to guide or     monitor treatment for     MRSA infections.     RESULT CALLED TO, READ BACK BY AND VERIFIED WITH:     VERGELDEDIOS,L RN @0048  ON 10.15.2014 BY MCREYNOLDS,B     Studies: Dg Chest 2 View  08/24/2013   CLINICAL DATA:  Shortness of breath and cough. Weakness.  EXAM: CHEST  2 VIEW  COMPARISON:  CHEST x-ray 08/23/2013.  FINDINGS: Lung volumes appear slightly low. Patchy linear opacities in lung bases bilaterally, favored to represent chronic scarring, similar prior studies. No definite acute consolidative airspace disease. No pleural effusions. No evidence of pulmonary edema. Heart size is borderline enlarged. Large hiatal hernia. Mediastinal contours are distorted by patient's rotation to the right. Atherosclerosis in the thoracic aorta.  IMPRESSION: No active cardiopulmonary disease.   Electronically Signed    By: Trudie Reed M.D.   On: 08/24/2013 13:36   Dg Chest 2 View  08/23/2013   CLINICAL DATA:  Altered mental status.  EXAM: CHEST  2 VIEW  COMPARISON:  01/04/2013  FINDINGS: Right apical density may partially be from rightward rotation causing projection of mediastinal structures, but I cannot exclude mass or pneumonia in this vicinity.  Atherosclerotic aortic arch noted with mild cardiomegaly. Large hiatal hernia noted.  Suspected small pleural effusions with indistinct lower lobe airspace opacities posteriorly. Bony demineralization noted.  IMPRESSION: 1. Right apical density may be partially projectional due to rotation, but I cannot exclude pneumonia or mass at the right lung apex based on this image. Followup chest radiography is recommended to reassess this region. 2. Large hiatal hernia 3. Cardiomegaly 4. Atherosclerosis 5. Bilateral lower lobe mild airspace opacities, probably atelectasis although technically nonspecific. Small bilateral pleural effusions.   Electronically Signed   By: Herbie Baltimore M.D.   On: 08/23/2013 17:03   Ct Head Wo Contrast  08/23/2013   CLINICAL DATA:  Altered mental status  EXAM: CT HEAD WITHOUT CONTRAST  TECHNIQUE: Contiguous axial images were obtained from the base of the skull through the vertex without intravenous contrast.  COMPARISON:  CT 01/04/2013  FINDINGS: Moderate atrophy. Chronic microvascular ischemic changes are stable. Chronic infarct left caudate is stable.  Negative for acute infarct. Negative for hemorrhage or mass lesion.  Air-fluid level left maxillary sinus. Right mastoid sinus effusion.  IMPRESSION: Atrophy and chronic ischemia. No acute intracranial abnormality.  Sinusitis with air-fluid level.   Electronically Signed   By: Marlan Palau M.D.   On: 08/23/2013 17:09    Scheduled Meds: . antiseptic oral rinse  15 mL Mouth Rinse BID  . aspirin EC  81 mg Oral Daily  . ceFEPime (MAXIPIME) IV  1 g Intravenous Q24H  . Chlorhexidine Gluconate  Cloth  6 each Topical Q0600  . docusate sodium  100 mg Oral BID  . enoxaparin (LOVENOX) injection  40 mg Subcutaneous Q24H  . metoprolol tartrate  12.5 mg Oral BID  . mupirocin ointment  1 application Nasal BID  . pantoprazole  40 mg Oral Daily  . polyethylene glycol  17 g Oral Daily  . vancomycin  750 mg Intravenous Q12H   Continuous Infusions:   Active Problems:   Confusion   HCAP (healthcare-associated pneumonia)    Time spent:    Rashad Auld K  [facility] Pager [phone]. If 7PM-7AM, please contact night-coverage at [email or web address], password [facility] 08/24/2013, 4:28 PM  LOS:  1 day

## 2013-08-25 DIAGNOSIS — E785 Hyperlipidemia, unspecified: Secondary | ICD-10-CM

## 2013-08-25 LAB — GLUCOSE, CAPILLARY
Glucose-Capillary: 210 mg/dL — ABNORMAL HIGH (ref 70–99)
Glucose-Capillary: 188 mg/dL — ABNORMAL HIGH (ref 70–99)

## 2013-08-25 LAB — URINE CULTURE

## 2013-08-25 MED ORDER — IPRATROPIUM BROMIDE 0.02 % IN SOLN
0.5000 mg | Freq: Four times a day (QID) | RESPIRATORY_TRACT | Status: DC
  Administered 2013-08-25 – 2013-08-26 (×6): 0.5 mg via RESPIRATORY_TRACT
  Filled 2013-08-25 (×6): qty 2.5

## 2013-08-25 MED ORDER — ALBUTEROL SULFATE (5 MG/ML) 0.5% IN NEBU
2.5000 mg | INHALATION_SOLUTION | Freq: Four times a day (QID) | RESPIRATORY_TRACT | Status: DC
  Administered 2013-08-26 (×2): 2.5 mg via RESPIRATORY_TRACT
  Filled 2013-08-25 (×2): qty 0.5

## 2013-08-25 MED ORDER — ALBUTEROL SULFATE (5 MG/ML) 0.5% IN NEBU
2.5000 mg | INHALATION_SOLUTION | RESPIRATORY_TRACT | Status: DC
  Administered 2013-08-25 (×4): 2.5 mg via RESPIRATORY_TRACT
  Filled 2013-08-25 (×4): qty 0.5

## 2013-08-25 MED ORDER — METHYLPREDNISOLONE SODIUM SUCC 125 MG IJ SOLR
60.0000 mg | Freq: Four times a day (QID) | INTRAMUSCULAR | Status: DC
  Administered 2013-08-25 – 2013-08-26 (×5): 60 mg via INTRAVENOUS
  Filled 2013-08-25 (×8): qty 0.96

## 2013-08-25 MED ORDER — ALBUTEROL SULFATE (5 MG/ML) 0.5% IN NEBU
2.5000 mg | INHALATION_SOLUTION | RESPIRATORY_TRACT | Status: DC | PRN
  Administered 2013-08-25: 04:00:00 2.5 mg via RESPIRATORY_TRACT
  Filled 2013-08-25: qty 0.5

## 2013-08-25 NOTE — Progress Notes (Signed)
[facility] PROGRESS NOTE  Savannah [name] VWU:[id number] DOB: [birth year] DOA: 08/23/2013 PCP: No primary provider on file.  Assessment/Plan: Confusion  - most likely secondary to PNA - will admit to telemetry bed and will obtain sputum culture, gram stain  - PT evaluation once more alert and awake  - urine and blood culture ordered  - ABX as noted below  HCAP (healthcare-associated pneumonia)  - place on broad spectrum ABX for now  - follow up on sputum analysis  - urine legionella and strep pneumo ordered  - provide supportive care with nebulizer as needed, oxygen, antiemetics as needed - Nonproductive cough with wheezing overnight - will add IV steroids DM - will cont on SSI HTN - BP stable - Cont current regimen  Code Status: Full Family Communication: Pt in room (indicate person spoken with, relationship, and if by phone, the number) Disposition Plan: Pending   Antibiotics:  Vancomycin 08/23/13>>>  Zosyn 08/23/13>>>  Cefepime 08/23/13>>>  HPI/Subjective: Reports feeling better  Objective: Filed Vitals:   08/24/13 2200 08/25/13 0522 08/25/13 0809 08/25/13 1021  BP: 141/61 133/55  127/67  Pulse: 69 77  97  Temp: 98 F (36.7 C) 97.8 F (36.6 C)    TempSrc: Oral Oral    Resp: 16 24    Height:      Weight:      SpO2: 97% 97% 96%     Intake/Output Summary (Last 24 hours) at 08/25/13 1029 Last data filed at 08/24/13 1300  Gross per 24 hour  Intake    690 ml  Output      0 ml  Net    690 ml   Filed Weights   08/23/13 1858 08/24/13 0509  Weight: 73.6 kg (162 lb 4.1 oz) 76.8 kg (169 lb 5 oz)    Exam:   General:  Awake, in nad  Cardiovascular: Regular, s1, s2  Respiratory: normal resp effort, no wheezing  Abdomen: soft, nondistended  Musculoskeletal: perfused, no clubbing   Data Reviewed: Basic Metabolic Panel:  Recent Labs Lab 08/23/13 1705 08/24/13 0545  NA 136 138  K 3.7 3.6  CL 90* 95*  CO2 39* 39*  GLUCOSE 109* 102*  BUN 25*  20  CREATININE 0.85 0.78  CALCIUM 9.1 8.5   Liver Function Tests:  Recent Labs Lab 08/23/13 1705  AST 18  ALT 13  ALKPHOS 52  BILITOT 0.4  PROT 6.1  ALBUMIN 2.9*   No results found for this basename: LIPASE, AMYLASE,  in the last 168 hours No results found for this basename: AMMONIA,  in the last 168 hours CBC:  Recent Labs Lab 08/23/13 1705 08/24/13 0545  WBC 7.0 6.1  NEUTROABS 4.5  --   HGB 11.5* 10.5*  HCT 38.3 35.8*  MCV 74.7* 75.5*  PLT 264 224   Cardiac Enzymes: No results found for this basename: CKTOTAL, CKMB, CKMBINDEX, TROPONINI,  in the last 168 hours BNP (last 3 results)  Recent Labs  08/23/13 2224  PROBNP 184.6   CBG:  Recent Labs Lab 08/24/13 1649 08/24/13 2149 08/25/13 0758  GLUCAP 97 124* 112*    Recent Results (from the past 240 hour(s))  URINE CULTURE     Status: None   Collection Time    08/23/13  5:25 PM      Result Value Range Status   Specimen Description URINE, CATHETERIZED   Final   Special Requests NONE   Final   Culture  Setup Time     Final  Value: 08/24/2013 01:49     Performed at [facility] Count     Final   Value: 50,000 COLONIES/ML     Performed at [facility]   Culture     Final   Value: STAPHYLOCOCCUS AUREUS     Note: RIFAMPIN AND GENTAMICIN SHOULD NOT BE USED AS SINGLE DRUGS FOR TREATMENT OF STAPH INFECTIONS.     Performed at [facility]   Report Status PENDING   Incomplete  MRSA PCR SCREENING     Status: Abnormal   Collection Time    08/23/13  7:11 PM      Result Value Range Status   MRSA by PCR POSITIVE (*) NEGATIVE Final   Comment:            The GeneXpert MRSA Assay (FDA     approved for NASAL specimens     only), is one component of a     comprehensive MRSA colonization     surveillance program. It is not     intended to diagnose MRSA     infection nor to guide or     monitor treatment for     MRSA infections.     RESULT CALLED TO, READ BACK BY AND VERIFIED  WITH:     VERGELDEDIOS,L RN @0048  ON 10.15.2014 BY MCREYNOLDS,B  CULTURE, BLOOD (ROUTINE X 2)     Status: None   Collection Time    08/23/13  8:19 PM      Result Value Range Status   Specimen Description BLOOD RIGHT ARM   Final   Special Requests BOTTLES DRAWN AEROBIC ONLY 3CC   Final   Culture  Setup Time     Final   Value: 08/24/2013 03:02     Performed at [facility]   Culture     Final   Value:        BLOOD CULTURE RECEIVED NO GROWTH TO DATE CULTURE WILL BE HELD FOR 5 DAYS BEFORE ISSUING A FINAL NEGATIVE REPORT     Performed at [facility]   Report Status PENDING   Incomplete  CULTURE, BLOOD (ROUTINE X 2)     Status: None   Collection Time    08/23/13 10:24 PM      Result Value Range Status   Specimen Description BLOOD RIGHT ANTECUBITAL   Final   Special Requests BOTTLES DRAWN AEROBIC AND ANAEROBIC 5CC   Final   Culture  Setup Time     Final   Value: 08/24/2013 04:37     Performed at [facility]   Culture     Final   Value:        BLOOD CULTURE RECEIVED NO GROWTH TO DATE CULTURE WILL BE HELD FOR 5 DAYS BEFORE ISSUING A FINAL NEGATIVE REPORT     Performed at [facility]   Report Status PENDING   Incomplete     Studies: Dg Chest 2 View  08/24/2013   CLINICAL DATA:  Shortness of breath and cough. Weakness.  EXAM: CHEST  2 VIEW  COMPARISON:  CHEST x-ray 08/23/2013.  FINDINGS: Lung volumes appear slightly low. Patchy linear opacities in lung bases bilaterally, favored to represent chronic scarring, similar prior studies. No definite acute consolidative airspace disease. No pleural effusions. No evidence of pulmonary edema. Heart size is borderline enlarged. Large hiatal hernia. Mediastinal contours are distorted by patient's rotation to the right. Atherosclerosis in the thoracic aorta.  IMPRESSION: No active cardiopulmonary disease.  Electronically Signed   By: Trudie Reed M.D.   On: 08/24/2013 13:36   Dg Chest 2 View  08/23/2013    CLINICAL DATA:  Altered mental status.  EXAM: CHEST  2 VIEW  COMPARISON:  01/04/2013  FINDINGS: Right apical density may partially be from rightward rotation causing projection of mediastinal structures, but I cannot exclude mass or pneumonia in this vicinity.  Atherosclerotic aortic arch noted with mild cardiomegaly. Large hiatal hernia noted.  Suspected small pleural effusions with indistinct lower lobe airspace opacities posteriorly. Bony demineralization noted.  IMPRESSION: 1. Right apical density may be partially projectional due to rotation, but I cannot exclude pneumonia or mass at the right lung apex based on this image. Followup chest radiography is recommended to reassess this region. 2. Large hiatal hernia 3. Cardiomegaly 4. Atherosclerosis 5. Bilateral lower lobe mild airspace opacities, probably atelectasis although technically nonspecific. Small bilateral pleural effusions.   Electronically Signed   By: Herbie Baltimore M.D.   On: 08/23/2013 17:03   Ct Head Wo Contrast  08/23/2013   CLINICAL DATA:  Altered mental status  EXAM: CT HEAD WITHOUT CONTRAST  TECHNIQUE: Contiguous axial images were obtained from the base of the skull through the vertex without intravenous contrast.  COMPARISON:  CT 01/04/2013  FINDINGS: Moderate atrophy. Chronic microvascular ischemic changes are stable. Chronic infarct left caudate is stable.  Negative for acute infarct. Negative for hemorrhage or mass lesion.  Air-fluid level left maxillary sinus. Right mastoid sinus effusion.  IMPRESSION: Atrophy and chronic ischemia. No acute intracranial abnormality.  Sinusitis with air-fluid level.   Electronically Signed   By: Marlan Palau M.D.   On: 08/23/2013 17:09    Scheduled Meds: . albuterol  2.5 mg Nebulization Q4H WA  . antiseptic oral rinse  15 mL Mouth Rinse BID  . aspirin EC  81 mg Oral Daily  . ceFEPime (MAXIPIME) IV  1 g Intravenous Q24H  . Chlorhexidine Gluconate Cloth  6 each Topical Q0600  . docusate  sodium  100 mg Oral BID  . enoxaparin (LOVENOX) injection  40 mg Subcutaneous Q24H  . influenza vac split quadrivalent PF  0.5 mL Intramuscular Tomorrow-1000  . insulin aspart  0-15 Units Subcutaneous TID WC  . insulin aspart  0-5 Units Subcutaneous QHS  . ipratropium  0.5 mg Nebulization QID  . methylPREDNISolone (SOLU-MEDROL) injection  60 mg Intravenous Q6H  . metoprolol tartrate  12.5 mg Oral BID  . mupirocin ointment  1 application Nasal BID  . pantoprazole  40 mg Oral Daily  . pneumococcal 23 valent vaccine  0.5 mL Intramuscular Tomorrow-1000  . polyethylene glycol  17 g Oral Daily  . vancomycin  750 mg Intravenous Q12H   Continuous Infusions:   Active Problems:   Confusion   HCAP (healthcare-associated pneumonia)    Time spent:    Tamika Nou K  [facility] Pager [phone]. If 7PM-7AM, please contact night-coverage at [email or web address], password [facility] 08/25/2013, 10:29 AM  LOS: 2 days

## 2013-08-25 NOTE — Progress Notes (Signed)
CRITICAL VALUE ALERT  Critical value received:  Urine Culture grew 50,000 Colonies of METHICILLIN RESISTANT STAPHYLOCOCCUS AUREUS (MRSA)  Date of notification:  08/25/13  Time of notification:  1235pm  Critical value read back:yes  Nurse who received alert:  Jiles Crocker  MD notified (1st page):  Dr. Rhona Leavens  Time of first page:  1240pm  MD notified (2nd page):  Time of second page:  Responding MD:  Dr. Rhona Leavens  Time MD responded:  1300

## 2013-08-25 NOTE — Progress Notes (Signed)
Physical Therapy Treatment Patient Details Name: Savannah [name] MRN: [id number] DOB: [birth year] Today's Date: 08/25/2013 Time: 4098-1191 PT Time Calculation (min): 19 min  PT Assessment / Plan / Recommendation  History of Present Illness Patient is 79 year old female who was brought from skilled nursing facility, [facility] on 08/23/13 by her family with main concern of progressively worsening confusion that was initially noted four days prior to admission. Per family, patient has been recently diagnosed with pneumonia and treated with antibiotic They have not noticed significant improvement but patient appeared to be more confused and was talking about things that did not make sense. In addition there has been reported fever, poor oral intake, more lethargy at facility where she lives. Patient is currently not able to provide history of due to somnolence but is easy to arouse with verbal stimuli.    PT Comments   Pt in bed restless stated "I need to pee".  Assisted pt OOB to [facility] to void.  Very shaky/unsteady.  Pt required MAX VC's on proper hand placement and to complete turn prior to sit.  Too shaky to attempt amb without a second assist so assisted back to bed.    Follow Up Recommendations  SNF     Does the patient have the potential to tolerate intense rehabilitation     Barriers to Discharge        Equipment Recommendations  None recommended by PT    Recommendations for Other Services    Frequency Min 3X/week   Progress towards PT Goals Progress towards PT goals: Progressing toward goals  Plan      Precautions / Restrictions Precautions Precautions: Fall    Pertinent Vitals/Pain No c/o pain    Mobility  Bed Mobility Bed Mobility: Supine to Sit;Sit to Supine Supine to Sit: 4: Min assist;3: Mod assist Sitting - Scoot to Edge of Bed: 4: Min assist;3: Mod assist Details for Bed Mobility Assistance: increased time esp to scoot to EOB and back to bed scoot to  [facility] Transfers Transfers: Sit to Stand;Stand to Sit Sit to Stand: 4: Min assist;From bed;From toilet;3: Mod assist Stand to Sit: 4: Min assist;3: Mod assist;To toilet;To bed Details for Transfer Assistance: 75% VC's on proper tech and hand placement plus safety with turn completion.  Pt very shaky and unsteady. Ambulation/Gait Ambulation/Gait Assistance Details: Too unsteady during transfer to attempt with a second person to assist.     PT Goals (current goals can now be found in the care plan section)    Visit Information  Last PT Received On: 08/25/13 History of Present Illness: Patient is 79 year old female who was brought from skilled nursing facility, [facility] on 08/23/13 by her family with main concern of progressively worsening confusion that was initially noted four days prior to admission. Per family, patient has been recently diagnosed with pneumonia and treated with antibiotic They have not noticed significant improvement but patient appeared to be more confused and was talking about things that did not make sense. In addition there has been reported fever, poor oral intake, more lethargy at facility where she lives. Patient is currently not able to provide history of due to somnolence but is easy to arouse with verbal stimuli.     Subjective Data      Cognition       Balance     End of Session PT - End of Session Activity Tolerance: Patient tolerated treatment well   Felecia Shelling  PTA [facility]  Acute  Rehab Pager  [phone] 

## 2013-08-25 NOTE — Progress Notes (Signed)
Pt has had a very congested non-productive cough this shift.  Writer contacted Resp.Therapy earlier in shift to schedule neb tx's and pt has had 2 this shift.  Ronchi, coarse crackles and expiratory wheezing has slightly improved. Monitoring pt closely as she continues on 2 l.min O2 with sats in upper 90s. Robitussin given this shift as ordered PRN with minimal results. IV abx continue. Pt without c/o.

## 2013-08-26 LAB — CBC WITH DIFFERENTIAL/PLATELET
Basophils Absolute: 0 10*3/uL (ref 0.0–0.1)
HCT: 32.7 % — ABNORMAL LOW (ref 36.0–46.0)
Hemoglobin: 9.8 g/dL — ABNORMAL LOW (ref 12.0–15.0)
Lymphocytes Relative: 7 % — ABNORMAL LOW (ref 12–46)
Lymphs Abs: 0.7 10*3/uL (ref 0.7–4.0)
MCV: 75.3 fL — ABNORMAL LOW (ref 78.0–100.0)
Monocytes Absolute: 0.2 10*3/uL (ref 0.1–1.0)
Monocytes Relative: 2 % — ABNORMAL LOW (ref 3–12)
Neutro Abs: 8.9 10*3/uL — ABNORMAL HIGH (ref 1.7–7.7)
RBC: 4.34 MIL/uL (ref 3.87–5.11)
WBC: 9.7 10*3/uL (ref 4.0–10.5)

## 2013-08-26 LAB — GLUCOSE, CAPILLARY: Glucose-Capillary: 190 mg/dL — ABNORMAL HIGH (ref 70–99)

## 2013-08-26 MED ORDER — PREDNISONE (PAK) 10 MG PO TABS: 10.0000 mg | ORAL_TABLET | Freq: Every day | ORAL | Status: DC

## 2013-08-26 MED ORDER — ACETAMINOPHEN 325 MG PO TABS
650.0000 mg | ORAL_TABLET | Freq: Four times a day (QID) | ORAL | Status: DC | PRN
  Administered 2013-08-26: 650 mg via ORAL
  Filled 2013-08-26: qty 2

## 2013-08-26 MED ORDER — CEFDINIR 300 MG PO CAPS: 300.0000 mg | ORAL_CAPSULE | Freq: Two times a day (BID) | ORAL | Status: DC

## 2013-08-26 MED ORDER — AZITHROMYCIN 500 MG PO TABS: 500.0000 mg | ORAL_TABLET | Freq: Every day | ORAL | Status: DC

## 2013-08-26 NOTE — Progress Notes (Signed)
Patient is set to discharge back to [facility] today. Patient's son aware. Discharge packet given to RN, Delorise Shiner. PTAR scheduled for pickup (Service Request Id: [id number]).   Unice Bailey, LCSW [facility] Clinical Social Worker cell #: [phone]

## 2013-08-26 NOTE — Progress Notes (Signed)
In speaking with family earlier in shift, writer was told how the family is very upset that the pt may be discharged too soon. Family stating that pt has severe cough and SOB.  Family states that pt will not get the care she needs at the Assisted Living where the pt lives.  Respiratory Therapy has also stated that the pt could benefit from scheduled doses of Mucinex. Pt continues with non-productive cough, ronchi, and expiratory wheezing. Crackles can also be noted.  O2 continues at 2 l.min via [place]. Robitussin this shift as ordered PRN. Solu-Medrol as scheduled.  Pt shows greater signs of weakness this night than last night.  Last night (08/24/13) pt was able to stand with one assist and transfer to [facility]. This night (08/25/13) pt is unable to sit at bedside due to weakness and "dizziness".

## 2013-08-26 NOTE — Progress Notes (Signed)
Discharged to [facility], report given to [employer], Picked up bt PT[facility], no complaints of any pain or discomfort. PIV removed no s/s of infiltration or swelling noted.

## 2013-08-26 NOTE — Progress Notes (Signed)
Reported at this time that pt had 8- beats VTach from [employer] When writer checked HR 73. Pt sleeping quietly.  No s/s of distress noted.

## 2013-08-26 NOTE — Discharge Summary (Addendum)
Physician Discharge Summary  Savannah [name] YNW:[id number] DOB: Sep 20, 1919 DOA: 08/23/2013  PCP: No primary provider on file.  Admit date: 08/23/2013 Discharge date: 08/26/2013  Time spent: 35 minutes  Recommendations for Outpatient Follow-up:  1. Follow up with PCP in 1-2 weeks  Discharge Diagnoses:  Active Problems:   Hypertension   Type II or unspecified type diabetes mellitus without mention of complication, uncontrolled   Confusion   HCAP (healthcare-associated pneumonia)  Discharge Condition: Improved  Diet recommendation: Regular  Filed Weights   08/23/13 1858 08/24/13 0509  Weight: 73.6 kg (162 lb 4.1 oz) 76.8 kg (169 lb 5 oz)    History of present illness:  Patient is 79 year old female who was brought from skilled nursing facility, [facility] by her family with main concern of progressively worsening confusion that was initially noted four days prior to admission. Per family, patient has been recently diagnosed with pneumonia and treated with antibiotic of which they do not know the name. They have not noticed significant improvement but patient appeared to be more confused and was talking about things that did not make sense. In addition there has been reported fever, poor oral intake, more lethargy at facility where she lives. Patient is currently not able to provide history of due to somnolence but is easy to arouse with verbal stimuli. There is no report of abdominal urinary concerns, no chest pain but it was noted that patient appeared more short of breath with exertion and associated with productive sputum.  In ED, pt noted to be somnolent but easy to arouse, CXR with worrisome finding of PNA. TRH asked to admit for further evaluation and management and telemetry bed requested.   Hospital Course:  Confusion  - most likely secondary to PNA - admitted to telemetry bed - urine and blood culture ordered  - Pt was continued on empiric vanc, zosyn, and cefepime (see  below) HCAP (healthcare-associated pneumonia)  - The patient was continued on empiric abx per above - The patient continued with coughing and wheezing - A follow up chest xray was obtained with no acute process identified at that time - IV steroids were added with marked improvement by the following day - The patient remained stable and afebrile - She will be discharged home with a steroid taper and completion of 10 days of empiric antibiotics (7 additional days after discharge) DM  - was cont on SSI  HTN  - BP stable   Discharge Exam: Filed Vitals:   08/25/13 2056 08/25/13 2204 08/26/13 0528 08/26/13 0731  BP:  131/50 132/45   Pulse: 72 88 89   Temp:  98 F (36.7 C) 98.6 F (37 C)   TempSrc:  Oral Oral   Resp: 20 18 18    Height:      Weight:      SpO2: 97% 94% 98% 98%    General: Awake, in nad Cardiovascular: regular, s1, s2 Respiratory: normal resp effort, improved wheezing  Discharge Instructions     Medication List         aspirin EC 81 MG tablet  Take 81 mg by mouth daily.     azithromycin 500 MG tablet  Commonly known as:  ZITHROMAX  Take 1 tablet (500 mg total) by mouth daily.     beta carotene w/minerals tablet  Take 1 tablet by mouth daily.     calcium-vitamin D 500-200 MG-UNIT per tablet  Commonly known as:  OSCAL WITH D  Take 1 tablet by mouth daily.  cefdinir 300 MG capsule  Commonly known as:  OMNICEF  Take 1 capsule (300 mg total) by mouth 2 (two) times daily.     DSS 100 MG Caps  Take 100 mg by mouth 2 (two) times daily.     ipratropium-albuterol 0.5-2.5 (3) MG/3ML Soln  Commonly known as:  DUONEB  Take 3 mLs by nebulization every 4 (four) hours as needed.     metoprolol tartrate 25 MG tablet  Commonly known as:  LOPRESSOR  Take 12.5 mg by mouth 2 (two) times daily.     omeprazole 20 MG capsule  Commonly known as:  PRILOSEC  Take 20 mg by mouth daily.     polyethylene glycol packet  Commonly known as:  MIRALAX / GLYCOLAX   Take 17 g by mouth daily.     predniSONE 10 MG tablet  Commonly known as:  STERAPRED UNI-PAK  Take 1 tablet (10 mg total) by mouth daily. 60mg  po qday x 3 days, then 40mg  po x 3 days, then 20mg  po qday x 3 days, then 10mg  po qday x 3 days, then 5mg  po qday x 3 days, then stop, zero refills     Vitamin D-3 1000 UNITS Caps  Take 1,000 Units by mouth daily.       No Known Allergies Follow-up Information   Schedule an appointment as soon as possible for a visit with Follow up with your PCP in 1-2 weeks.       The results of significant diagnostics from this hospitalization (including imaging, microbiology, ancillary and laboratory) are listed below for reference.    Significant Diagnostic Studies: Dg Chest 2 View  08/24/2013   CLINICAL DATA:  Shortness of breath and cough. Weakness.  EXAM: CHEST  2 VIEW  COMPARISON:  CHEST x-ray 08/23/2013.  FINDINGS: Lung volumes appear slightly low. Patchy linear opacities in lung bases bilaterally, favored to represent chronic scarring, similar prior studies. No definite acute consolidative airspace disease. No pleural effusions. No evidence of pulmonary edema. Heart size is borderline enlarged. Large hiatal hernia. Mediastinal contours are distorted by patient's rotation to the right. Atherosclerosis in the thoracic aorta.  IMPRESSION: No active cardiopulmonary disease.   Electronically Signed   By: Trudie Reed M.D.   On: 08/24/2013 13:36   Dg Chest 2 View  08/23/2013   CLINICAL DATA:  Altered mental status.  EXAM: CHEST  2 VIEW  COMPARISON:  01/04/2013  FINDINGS: Right apical density may partially be from rightward rotation causing projection of mediastinal structures, but I cannot exclude mass or pneumonia in this vicinity.  Atherosclerotic aortic arch noted with mild cardiomegaly. Large hiatal hernia noted.  Suspected small pleural effusions with indistinct lower lobe airspace opacities posteriorly. Bony demineralization noted.  IMPRESSION: 1.  Right apical density may be partially projectional due to rotation, but I cannot exclude pneumonia or mass at the right lung apex based on this image. Followup chest radiography is recommended to reassess this region. 2. Large hiatal hernia 3. Cardiomegaly 4. Atherosclerosis 5. Bilateral lower lobe mild airspace opacities, probably atelectasis although technically nonspecific. Small bilateral pleural effusions.   Electronically Signed   By: Herbie Baltimore M.D.   On: 08/23/2013 17:03   Ct Head Wo Contrast  08/23/2013   CLINICAL DATA:  Altered mental status  EXAM: CT HEAD WITHOUT CONTRAST  TECHNIQUE: Contiguous axial images were obtained from the base of the skull through the vertex without intravenous contrast.  COMPARISON:  CT 01/04/2013  FINDINGS: Moderate atrophy. Chronic microvascular ischemic changes  are stable. Chronic infarct left caudate is stable.  Negative for acute infarct. Negative for hemorrhage or mass lesion.  Air-fluid level left maxillary sinus. Right mastoid sinus effusion.  IMPRESSION: Atrophy and chronic ischemia. No acute intracranial abnormality.  Sinusitis with air-fluid level.   Electronically Signed   By: Marlan Palau M.D.   On: 08/23/2013 17:09    Microbiology: Recent Results (from the past 240 hour(s))  URINE CULTURE     Status: None   Collection Time    08/23/13  5:25 PM      Result Value Range Status   Specimen Description URINE, CATHETERIZED   Final   Special Requests NONE   Final   Culture  Setup Time     Final   Value: 08/24/2013 01:49     Performed at [facility] Count     Final   Value: 50,000 COLONIES/ML     Performed at [facility]   Culture     Final   Value: METHICILLIN RESISTANT STAPHYLOCOCCUS AUREUS     Note: RIFAMPIN AND GENTAMICIN SHOULD NOT BE USED AS SINGLE DRUGS FOR TREATMENT OF STAPH INFECTIONS. CRITICAL RESULT CALLED TO, READ BACK BY AND VERIFIED WITH: IFY ORAEGBUNAN @ 12:35PM 08/25/13 BY DWEEKS     Performed at  [facility]   Report Status 08/25/2013 FINAL   Final   Organism ID, Bacteria METHICILLIN RESISTANT STAPHYLOCOCCUS AUREUS   Final  MRSA PCR SCREENING     Status: Abnormal   Collection Time    08/23/13  7:11 PM      Result Value Range Status   MRSA by PCR POSITIVE (*) NEGATIVE Final   Comment:            The GeneXpert MRSA Assay (FDA     approved for NASAL specimens     only), is one component of a     comprehensive MRSA colonization     surveillance program. It is not     intended to diagnose MRSA     infection nor to guide or     monitor treatment for     MRSA infections.     RESULT CALLED TO, READ BACK BY AND VERIFIED WITH:     VERGELDEDIOS,L RN @0048  ON 10.15.2014 BY MCREYNOLDS,B  CULTURE, BLOOD (ROUTINE X 2)     Status: None   Collection Time    08/23/13  8:19 PM      Result Value Range Status   Specimen Description BLOOD RIGHT ARM   Final   Special Requests BOTTLES DRAWN AEROBIC ONLY 3CC   Final   Culture  Setup Time     Final   Value: 08/24/2013 03:02     Performed at [facility]   Culture     Final   Value:        BLOOD CULTURE RECEIVED NO GROWTH TO DATE CULTURE WILL BE HELD FOR 5 DAYS BEFORE ISSUING A FINAL NEGATIVE REPORT     Performed at [facility]   Report Status PENDING   Incomplete  CULTURE, BLOOD (ROUTINE X 2)     Status: None   Collection Time    08/23/13 10:24 PM      Result Value Range Status   Specimen Description BLOOD RIGHT ANTECUBITAL   Final   Special Requests BOTTLES DRAWN AEROBIC AND ANAEROBIC 5CC   Final   Culture  Setup Time     Final   Value: 08/24/2013 04:37  Performed at [facility]     Final   Value:        BLOOD CULTURE RECEIVED NO GROWTH TO DATE CULTURE WILL BE HELD FOR 5 DAYS BEFORE ISSUING A FINAL NEGATIVE REPORT     Performed at [facility]   Report Status PENDING   Incomplete     Labs: Basic Metabolic Panel:  Recent Labs Lab 08/23/13 1705 08/24/13 0545  NA 136 138  K  3.7 3.6  CL 90* 95*  CO2 39* 39*  GLUCOSE 109* 102*  BUN 25* 20  CREATININE 0.85 0.78  CALCIUM 9.1 8.5   Liver Function Tests:  Recent Labs Lab 08/23/13 1705  AST 18  ALT 13  ALKPHOS 52  BILITOT 0.4  PROT 6.1  ALBUMIN 2.9*   No results found for this basename: LIPASE, AMYLASE,  in the last 168 hours No results found for this basename: AMMONIA,  in the last 168 hours CBC:  Recent Labs Lab 08/23/13 1705 08/24/13 0545 08/26/13 0511  WBC 7.0 6.1 9.7  NEUTROABS 4.5  --  8.9*  HGB 11.5* 10.5* 9.8*  HCT 38.3 35.8* 32.7*  MCV 74.7* 75.5* 75.3*  PLT 264 224 264   Cardiac Enzymes: No results found for this basename: CKTOTAL, CKMB, CKMBINDEX, TROPONINI,  in the last 168 hours BNP: BNP (last 3 results)  Recent Labs  08/23/13 2224  PROBNP 184.6   CBG:  Recent Labs Lab 08/25/13 0758 08/25/13 1137 08/25/13 1654 08/25/13 2201 08/26/13 0834  GLUCAP 112* 210* 188*  188* 349* 190*    Signed:  CHIU, STEPHEN K  [facility] 08/26/2013, 10:09 AM

## 2013-08-29 ENCOUNTER — Non-Acute Institutional Stay (SKILLED_NURSING_FACILITY): Payer: PRIVATE HEALTH INSURANCE | Admitting: Internal Medicine

## 2013-08-29 ENCOUNTER — Encounter: Payer: Self-pay | Admitting: Internal Medicine

## 2013-08-29 DIAGNOSIS — I10 Essential (primary) hypertension: Secondary | ICD-10-CM

## 2013-08-29 DIAGNOSIS — I509 Heart failure, unspecified: Secondary | ICD-10-CM

## 2013-08-29 DIAGNOSIS — M810 Age-related osteoporosis without current pathological fracture: Secondary | ICD-10-CM

## 2013-08-29 DIAGNOSIS — J9610 Chronic respiratory failure, unspecified whether with hypoxia or hypercapnia: Secondary | ICD-10-CM

## 2013-08-29 DIAGNOSIS — J189 Pneumonia, unspecified organism: Secondary | ICD-10-CM

## 2013-08-29 NOTE — Progress Notes (Signed)
Patient ID: Savannah [name], female   DOB: Apr 12, 1919, 79 y.o.   MRN: [id number]  [facility]  Code Status: DNR  No Known Allergies  Chief Complaint: hospitalization follow up  HPI:  79 y/o femaleis a long term resident of the facility and was in the hospital recently with altered mental status. Her cxr was suggestive of pneumonia. Her confusion was thought to be in setting of HCAP. She was started on broad spectrum antibiotics, prednisone and showed imporvement. She was then discharged back to the facility. She is extremely hard of hearing and seen in her room today. She is in no acute distress. She appears comfortable with her oxygen by nasal canula. She denies any complaints. No concerns from staff  Review of Systems   Constitutional: Negative for malaise/fatigue.   Respiratory: Negative for cough and shortness of breath.    Cardiovascular: Negative for chest pain and leg swelling.   Gastrointestinal: Negative for heartburn, abdominal pain and constipation.   Musculoskeletal: Negative for myalgias and joint pain.   Skin: Negative.    Neurological: Negative for headaches.   Psychiatric/Behavioral: Negative for depression  Past Medical History  Diagnosis Date  . Hypertension   . Hypercholesteremia   . Pelvis fracture   . Kidney stones   . Hyperglycemia   . Diabetes mellitus without complication   . CVA (cerebral infarction)   . Hypoxia    Past Surgical History  Procedure Laterality Date  . Abdominal hysterectomy     Family History  Problem Relation Age of Onset  . Heart disease Mother   . Heart disease Father   . Diabetes Sister   . Stroke Brother     History   Social History  . Marital Status: Widowed    Spouse Name: N/A    Number of Children: N/A  . Years of Education: N/A   Occupational History  . Not on file.   Social History Main Topics  . Smoking status: Former Games developer  . Smokeless tobacco: Not on file  . Alcohol Use: No  . Drug Use: No  .  Sexual Activity: Not on file   Other Topics Concern  . Not on file   Social History Narrative  . No narrative on file   Current Outpatient Prescriptions on File Prior to Visit  Medication Sig Dispense Refill  . aspirin EC 81 MG tablet Take 81 mg by mouth daily.      Marland Kitchen azithromycin (ZITHROMAX) 500 MG tablet Take 1 tablet (500 mg total) by mouth daily.  7 tablet  0  . beta carotene w/minerals (OCUVITE) tablet Take 1 tablet by mouth daily.      . calcium-vitamin D (OSCAL WITH D) 500-200 MG-UNIT per tablet Take 1 tablet by mouth daily.      . cefdinir (OMNICEF) 300 MG capsule Take 1 capsule (300 mg total) by mouth 2 (two) times daily.  14 capsule  0  . Cholecalciferol (VITAMIN D-3) 1000 UNITS CAPS Take 1,000 Units by mouth daily.      Tery Sanfilippo Sodium (DSS) 100 MG CAPS Take 100 mg by mouth 2 (two) times daily.      Marland Kitchen ipratropium-albuterol (DUONEB) 0.5-2.5 (3) MG/3ML SOLN Take 3 mLs by nebulization every 4 (four) hours as needed.      . metoprolol tartrate (LOPRESSOR) 25 MG tablet Take 12.5 mg by mouth 2 (two) times daily.      Marland Kitchen omeprazole (PRILOSEC) 20 MG capsule Take 20 mg by mouth daily.      Marland Kitchen  polyethylene glycol (MIRALAX / GLYCOLAX) packet Take 17 g by mouth daily.  14 each  0  . predniSONE (STERAPRED UNI-PAK) 10 MG tablet Take 1 tablet (10 mg total) by mouth daily. 60mg  po qday x 3 days, then 40mg  po x 3 days, then 20mg  po qday x 3 days, then 10mg  po qday x 3 days, then 5mg  po qday x 3 days, then stop, zero refills       No current facility-administered medications on file prior to visit.    Physical exam- Nursing note and vital signs reviewed  Temp 97.1, hr 60/min, rr 18/min, bp 134/67, o2 sat 975 on 2 l o2 by nasal canula  Constitutional: She appears well-developed and well-nourished.   Neck: Neck supple. No JVD present. No thyromegaly present.   Cardiovascular: Normal rate, regular rhythm and intact distal pulses.    Respiratory: Effort normal. No respiratory distress. She has  scattered rhonchi and minimal wheezing. No crackles GI: Soft. Bowel sounds are normal. She exhibits no distension. There is no tenderness.  Musculoskeletal: Normal range of motion. She exhibits no edema.  Neurological: She is alert.   Skin: Skin is warm and dry.  Psychiatric: She has a normal mood and affect.   Labs reviewed  CBC    Component Value Date/Time   WBC 9.7 08/26/2013 0511   WBC 5.7 09/12/2010 1031   WBC 8.8 12/22/2007 0856   RBC 4.34 08/26/2013 0511   RBC 4.95 09/12/2010 1031   RBC 4.76 12/22/2007 0856   HGB 9.8* 08/26/2013 0511   HGB 14.3 09/12/2010 1031   HGB 13.5 12/22/2007 0856   HCT 32.7* 08/26/2013 0511   HCT 43.3 09/12/2010 1031   HCT 39.8 12/22/2007 0856   PLT 264 08/26/2013 0511   PLT 231 09/12/2010 1031   PLT 285 12/22/2007 0856   MCV 75.3* 08/26/2013 0511   MCV 85 09/12/2010 1031   MCV 83.5 12/22/2007 0856   MCH 22.6* 08/26/2013 0511   MCH 28.0 09/12/2010 1031   MCH 28.3 12/22/2007 0856   MCHC 30.0 08/26/2013 0511   MCHC 32.9 09/12/2010 1031   MCHC 33.9 12/22/2007 0856   RDW 15.4 08/26/2013 0511   RDW 11.8 09/12/2010 1031   RDW 15.2* 12/22/2007 0856   LYMPHSABS 0.7 08/26/2013 0511   LYMPHSABS 1.6 09/12/2010 1031   LYMPHSABS 1.6 12/22/2007 0856   MONOABS 0.2 08/26/2013 0511   MONOABS 0.8 12/22/2007 0856   EOSABS 0.0 08/26/2013 0511   EOSABS 0.0 09/12/2010 1031   EOSABS 0.0 12/22/2007 0856   BASOSABS 0.0 08/26/2013 0511   BASOSABS 0.0 09/12/2010 1031   BASOSABS 0.0 12/22/2007 0856    CMP     Component Value Date/Time   NA 138 08/24/2013 0545   K 3.6 08/24/2013 0545   CL 95* 08/24/2013 0545   CO2 39* 08/24/2013 0545   GLUCOSE 102* 08/24/2013 0545   BUN 20 08/24/2013 0545   CREATININE 0.78 08/24/2013 0545   CALCIUM 8.5 08/24/2013 0545   PROT 6.1 08/23/2013 1705   ALBUMIN 2.9* 08/23/2013 1705   AST 18 08/23/2013 1705   ALT 13 08/23/2013 1705   ALKPHOS 52 08/23/2013 1705   BILITOT 0.4 08/23/2013 1705   GFRNONAA 70* 08/24/2013 0545   GFRAA 82* 08/24/2013  0545    Imaging reviewed  Dg Chest 2 View  08/24/2013   CLINICAL DATA:  Shortness of breath and cough. Weakness.  EXAM: CHEST  2 VIEW  COMPARISON:  CHEST x-ray 08/23/2013.  FINDINGS: Lung volumes appear slightly low.  Patchy linear opacities in lung bases bilaterally, favored to represent chronic scarring, similar prior studies. No definite acute consolidative airspace disease. No pleural effusions. No evidence of pulmonary edema. Heart size is borderline enlarged. Large hiatal hernia. Mediastinal contours are distorted by patient's rotation to the right. Atherosclerosis in the thoracic aorta.  IMPRESSION: No active cardiopulmonary disease.   Electronically Signed   By: Trudie Reed M.D.   On: 08/24/2013 13:36   Dg Chest 2 View  08/23/2013   CLINICAL DATA:  Altered mental status.  EXAM: CHEST  2 VIEW  COMPARISON:  01/04/2013  FINDINGS: Right apical density may partially be from rightward rotation causing projection of mediastinal structures, but I cannot exclude mass or pneumonia in this vicinity.  Atherosclerotic aortic arch noted with mild cardiomegaly. Large hiatal hernia noted.  Suspected small pleural effusions with indistinct lower lobe airspace opacities posteriorly. Bony demineralization noted.  IMPRESSION: 1. Right apical density may be partially projectional due to rotation, but I cannot exclude pneumonia or mass at the right lung apex based on this image. Followup chest radiography is recommended to reassess this region. 2. Large hiatal hernia 3. Cardiomegaly 4. Atherosclerosis 5. Bilateral lower lobe mild airspace opacities, probably atelectasis although technically nonspecific. Small bilateral pleural effusions.   Electronically Signed   By: Herbie Baltimore M.D.   On: 08/23/2013 17:03   Ct Head Wo Contrast  08/23/2013   CLINICAL DATA:  Altered mental status  EXAM: CT HEAD WITHOUT CONTRAST  TECHNIQUE: Contiguous axial images were obtained from the base of the skull through the vertex  without intravenous contrast.  COMPARISON:  CT 01/04/2013  FINDINGS: Moderate atrophy. Chronic microvascular ischemic changes are stable. Chronic infarct left caudate is stable.  Negative for acute infarct. Negative for hemorrhage or mass lesion.  Air-fluid level left maxillary sinus. Right mastoid sinus effusion.  IMPRESSION: Atrophy and chronic ischemia. No acute intracranial abnormality.  Sinusitis with air-fluid level.   Electronically Signed   By: Marlan Palau M.D.   On: 08/23/2013 17:09    ASSESSMENT/ PLAN:  HCAP Improved breathing status. Complete course of po antibiotics omnicef and zithromax and tapering course of prednsione. Will continue duonebs when awake and continue o2 by nasal canula  Chronic respiratory failure To continue o2 by nasal canula and bronchodilator treatment. To take aspiration precautions to prevent acute worsening with infection  Type II diabetes mellitus   Well controlled. Off metformin for now. Monitor a1c periodically. Continue aspirin  Reflux disease Continue prilosec for now and monitor  Hypertension continue lopressor 12.5 mg twice daily and asa 81 mg daily. Monitor vital signs  chf Worsened recently in setting of pneumonia. Monitor weight for now. Will recheck bnp if symptoms worsen. Has follow up with cardiology set up  Osteoporosis, unspecified Continue calcium and vit d supplements. Fall precautions. Off fosamax and is on long term PPI. With her being on prednsione at present and antibiotics, continue PPI but should consider stopping it or reducing the dose after that to help prevent worsening of osteoporosis

## 2013-08-30 LAB — CULTURE, BLOOD (ROUTINE X 2): Culture: NO GROWTH

## 2013-10-20 ENCOUNTER — Encounter: Payer: Self-pay | Admitting: Internal Medicine

## 2013-10-20 ENCOUNTER — Non-Acute Institutional Stay (SKILLED_NURSING_FACILITY): Payer: PRIVATE HEALTH INSURANCE | Admitting: Internal Medicine

## 2013-10-20 DIAGNOSIS — K219 Gastro-esophageal reflux disease without esophagitis: Secondary | ICD-10-CM

## 2013-10-20 DIAGNOSIS — I10 Essential (primary) hypertension: Secondary | ICD-10-CM

## 2013-10-20 DIAGNOSIS — J9610 Chronic respiratory failure, unspecified whether with hypoxia or hypercapnia: Secondary | ICD-10-CM

## 2013-10-20 DIAGNOSIS — E538 Deficiency of other specified B group vitamins: Secondary | ICD-10-CM

## 2013-10-20 DIAGNOSIS — M810 Age-related osteoporosis without current pathological fracture: Secondary | ICD-10-CM

## 2013-10-20 NOTE — Progress Notes (Signed)
Patient ID: Savannah [name], female   DOB: 1919/09/01, 79 y.o.   MRN: [id number]    [facility]  Code Status: DNR  No Known Allergies  Chief Complaint: medical management of chronic illness  HPI:  79 y/o female patient seen today for routine visit. No new concern from staff. She denies any complaints. No concerns from staff. Appetite is fair  Review of Systems   Constitutional: Negative for malaise/fatigue.   Respiratory: Negative for cough and shortness of breath.    Cardiovascular: Negative for chest pain and leg swelling.   Gastrointestinal: Negative for heartburn, abdominal pain and constipation.   Musculoskeletal: Negative for myalgias and joint pain.   Skin: Negative.    Neurological: Negative for headaches.   Psychiatric/Behavioral: Negative for depression  Medication reviewed. See [facility]  Past Medical History  Diagnosis Date  . Hypertension   . Hypercholesteremia   . Pelvis fracture   . Kidney stones   . Hyperglycemia   . Diabetes mellitus without complication   . CVA (cerebral infarction)   . Hypoxia    Past Surgical History  Procedure Laterality Date  . Abdominal hysterectomy      Physical exam- BP 124/70  Pulse 74  Temp(Src) 97 F (36.1 C)  Resp 16  SpO2 96%  Constitutional: She appears well-developed and well-nourished.   Neck: Neck supple. No JVD present. No thyromegaly present.   Cardiovascular: Normal rate, regular rhythm and intact distal pulses.    Respiratory: Effort normal. No respiratory distress. She has scattered rhonchi and minimal wheezing. No crackles GI: Soft. Bowel sounds are normal. She exhibits no distension. There is no tenderness.  Musculoskeletal: Normal range of motion. She exhibits no edema.  Neurological: She is alert.   Skin: Skin is warm and dry.  Psychiatric: She has a normal mood and affect.   Labs- Reviewed  Assessment/plan  Osteoporosis, unspecified Continue calcium and vit d supplements. Fall  precautions.  Hypertension continue lopressor 12.5 mg twice daily and asa 81 mg daily. Monitor vital signs  b12 deficiency Continue b12 supplement  Chronic respiratory failure  Continue bronchodilator treatment. Is on spiriva.To take aspiration precautions. o2 by nasal canula  Reflux disease Continue prilosec for now and monitor

## 2013-11-28 ENCOUNTER — Encounter: Payer: Self-pay | Admitting: Internal Medicine

## 2013-11-28 ENCOUNTER — Non-Acute Institutional Stay (SKILLED_NURSING_FACILITY): Payer: PRIVATE HEALTH INSURANCE | Admitting: Internal Medicine

## 2013-11-28 DIAGNOSIS — K219 Gastro-esophageal reflux disease without esophagitis: Secondary | ICD-10-CM | POA: Insufficient documentation

## 2013-11-28 DIAGNOSIS — M810 Age-related osteoporosis without current pathological fracture: Secondary | ICD-10-CM

## 2013-11-28 DIAGNOSIS — J9610 Chronic respiratory failure, unspecified whether with hypoxia or hypercapnia: Secondary | ICD-10-CM

## 2013-11-28 DIAGNOSIS — K5900 Constipation, unspecified: Secondary | ICD-10-CM

## 2013-11-28 NOTE — Progress Notes (Signed)
Patient ID: Savannah [name], female   DOB: 11/05/1919, 80 y.o.   MRN: [id number]    [facility]  Code Status: DNR  No Known Allergies  Chief Complaint: hospitalization follow up  HPI:  80 y/o female patient seen today for routine visit. No new concern from staff. She denies any complaints. No concerns from staff  Review of Systems   Constitutional: Negative for malaise/fatigue.   Respiratory: Negative for cough and shortness of breath.    Cardiovascular: Negative for chest pain and leg swelling.   Gastrointestinal: Negative for heartburn, abdominal pain and constipation.   Musculoskeletal: Negative for myalgias and joint pain.   Skin: Negative.    Neurological: Negative for headaches.   Psychiatric/Behavioral: Negative for depression  Med[facility]. [facility]  Physical exam- Nursing note and vital signs reviewed BP 140/67  Pulse 62  Temp(Src) 97.3 F (36.3 C)  Resp 18  SpO2 96%   Constitutional: She appears well-developed and well-nourished.   Neck: Neck supple. No JVD present. No thyromegaly present.   Cardiovascular: Normal rate, regular rhythm and intact distal pulses.    Respiratory: Effort normal. No respiratory distress. She has scattered rhonchi and minimal wheezing. No crackles. On 2l by [place] [facility]: Soft. Bowel sounds are normal. She exhibits no distension. There is no tenderness.  Musculoskeletal: Normal range of motion. She exhibits no edema.  Neurological: She is alert.   Skin: Skin is warm and dry.  Psychiatric: She has a normal mood and affect.    ASSESSMENT/ PLAN:  Hypertension continue lopressor 12.5 mg twice daily and asa 81 mg daily. Monitor vital signs  Reflux disease Continue prilosec for now and monitor  Chronic respiratory failure  Continue bronchodilator treatment. To take aspiration precautions. o2 by nasal canula  Constipation Continue current bowel regimen  Osteoporosis, unspecified Continue calcium and vit d supplements.  Fall precautions.

## 2013-12-14 DIAGNOSIS — E538 Deficiency of other specified B group vitamins: Secondary | ICD-10-CM | POA: Insufficient documentation

## 2013-12-14 DIAGNOSIS — K219 Gastro-esophageal reflux disease without esophagitis: Secondary | ICD-10-CM | POA: Insufficient documentation

## 2013-12-22 ENCOUNTER — Non-Acute Institutional Stay (SKILLED_NURSING_FACILITY): Payer: PRIVATE HEALTH INSURANCE | Admitting: Internal Medicine

## 2013-12-22 DIAGNOSIS — J9610 Chronic respiratory failure, unspecified whether with hypoxia or hypercapnia: Secondary | ICD-10-CM

## 2013-12-22 DIAGNOSIS — D509 Iron deficiency anemia, unspecified: Secondary | ICD-10-CM

## 2013-12-22 DIAGNOSIS — E559 Vitamin D deficiency, unspecified: Secondary | ICD-10-CM | POA: Insufficient documentation

## 2013-12-22 DIAGNOSIS — K5900 Constipation, unspecified: Secondary | ICD-10-CM

## 2013-12-22 DIAGNOSIS — M810 Age-related osteoporosis without current pathological fracture: Secondary | ICD-10-CM

## 2013-12-22 DIAGNOSIS — I10 Essential (primary) hypertension: Secondary | ICD-10-CM

## 2013-12-22 DIAGNOSIS — K219 Gastro-esophageal reflux disease without esophagitis: Secondary | ICD-10-CM

## 2013-12-22 NOTE — Progress Notes (Signed)
Patient ID: Savannah [name], female   DOB: 1919/03/12, 80 y.o.   MRN: [id number]    Savannah [name]  Code Status: DNR  No Known Allergies  Chief Complaint:routine visit  HPI:  80 y/o female patient seen today for routine visit. No new concern from staff. She denies any complaints.   Review of Systems   Constitutional: Negative for malaise/fatigue.   Respiratory: Negative for cough and shortness of breath.    Cardiovascular: Negative for chest pain and leg swelling.   Gastrointestinal: Negative for heartburn, abdominal pain and constipation.   Musculoskeletal: Negative for myalgias and joint pain.   Skin: Negative.    Neurological: Negative for headaches.   Psychiatric/Behavioral: Negative for depression  Past Medical History  Diagnosis Date  . Hypertension   . Hypercholesteremia   . Pelvis fracture   . Kidney stones   . Hyperglycemia   . Diabetes mellitus without complication   . CVA (cerebral infarction)   . Hypoxia    Past Surgical History  Procedure Laterality Date  . Abdominal hysterectomy     Current Outpatient Prescriptions on File Prior to Visit  Medication Sig Dispense Refill  . aspirin EC 81 MG tablet Take 81 mg by mouth daily.      Marland Kitchen azithromycin (ZITHROMAX) 500 MG tablet Take 1 tablet (500 mg total) by mouth daily.  7 tablet  0  . beta carotene w/minerals (OCUVITE) tablet Take 1 tablet by mouth daily.      . calcium-vitamin D (OSCAL WITH D) 500-200 MG-UNIT per tablet Take 1 tablet by mouth daily.      . cefdinir (OMNICEF) 300 MG capsule Take 1 capsule (300 mg total) by mouth 2 (two) times daily.  14 capsule  0  . Cholecalciferol (VITAMIN D-3) 1000 UNITS CAPS Take 1,000 Units by mouth daily.      Tery Sanfilippo Sodium (DSS) 100 MG CAPS Take 100 mg by mouth 2 (two) times daily.      Marland Kitchen ipratropium-albuterol (DUONEB) 0.5-2.5 (3) MG/3ML SOLN Take 3 mLs by nebulization every 4 (four) hours as needed.      . metoprolol tartrate (LOPRESSOR) 25 MG tablet Take 12.5 mg  by mouth 2 (two) times daily.      Marland Kitchen omeprazole (PRILOSEC) 20 MG capsule Take 20 mg by mouth daily.      . polyethylene glycol (MIRALAX / GLYCOLAX) packet Take 17 g by mouth daily.  14 each  0  . predniSONE (STERAPRED UNI-PAK) 10 MG tablet Take 1 tablet (10 mg total) by mouth daily. 60mg  po qday x 3 days, then 40mg  po x 3 days, then 20mg  po qday x 3 days, then 10mg  po qday x 3 days, then 5mg  po qday x 3 days, then stop, zero refills       No current facility-administered medications on file prior to visit.   Family History  Problem Relation Age of Onset  . Heart disease Mother   . Heart disease Father   . Diabetes Sister   . Stroke Brother    History   Social History  . Marital Status: Widowed    Spouse Name: N/A    Number of Children: N/A  . Years of Education: N/A   Occupational History  . Not on file.   Social History Main Topics  . Smoking status: Former Games developer  . Smokeless tobacco: Not on file  . Alcohol Use: No  . Drug Use: No  . Sexual Activity: Not on file   Other  Topics Concern  . Not on file   Social History Narrative  . No narrative on file   Physical exam BP 126/76  Pulse 74  Temp(Src) 98.3 F (36.8 C)  Resp 18  SpO2 96%  Constitutional: She appears well-developed and well-nourished.   Neck: Neck supple. No JVD present. No thyromegaly present.   Cardiovascular: Normal rate, regular rhythm and intact distal pulses.    Respiratory: Effort normal. No respiratory distress. She has scattered rhonchi and minimal wheezing. No crackles. On 2l by [place] GI: Soft. Bowel sounds are normal. She exhibits no distension. There is no tenderness.  Musculoskeletal: Normal range of motion. She exhibits no edema.  Neurological: She is alert.   Skin: Skin is warm and dry.  Psychiatric: She has a normal mood and affect.   Labs- 12/16/13 vit d 27.43, chol 172, ldl 117, hdl 35, tg 100, na 141, k 4.1, co2 36, glu 97, bun 14, cr 0.7, a1c 6.5, wbc 4.6, hb 10.8, hct 38.5, plt  227  ASSESSMENT/ PLAN:  Vitamin d def Change her vitamin d to 2000 u daily for now. Continue ca- vit d supplement  Anemia Low hb/hct with low mcv. Will have her on ferrous sulfate 325 mg daily for now. Monitor h/h  Hypertension continue lopressor 12.5 mg twice daily and asa 81 mg daily. Monitor vital signs  Reflux disease Continue prilosec for now and monitor  Chronic respiratory failure  Continue bronchodilator treatment. To take aspiration precautions. o2 by nasal canula  Constipation Continue current bowel regimen  Osteoporosis, unspecified Continue calcium and vit d supplements. Fall precautions   Oneal GroutMAHIMA Lexton Hidalgo, MD  [facility] [phone] (Monday-Friday 8 am - 5 pm) [phone] (afterhours)

## 2014-01-05 ENCOUNTER — Inpatient Hospital Stay (HOSPITAL_COMMUNITY)
Admission: EM | Admit: 2014-01-05 | Discharge: 2014-01-16 | DRG: 177 | Disposition: A | Discharge status: Hospice | Payer: PRIVATE HEALTH INSURANCE | Attending: Internal Medicine | Admitting: Internal Medicine

## 2014-01-05 ENCOUNTER — Encounter (HOSPITAL_COMMUNITY): Payer: Self-pay | Admitting: Emergency Medicine

## 2014-01-05 ENCOUNTER — Emergency Department (HOSPITAL_COMMUNITY): Payer: PRIVATE HEALTH INSURANCE

## 2014-01-05 DIAGNOSIS — Z515 Encounter for palliative care: Secondary | ICD-10-CM

## 2014-01-05 DIAGNOSIS — E1165 Type 2 diabetes mellitus with hyperglycemia: Secondary | ICD-10-CM

## 2014-01-05 DIAGNOSIS — R1311 Dysphagia, oral phase: Secondary | ICD-10-CM | POA: Diagnosis present

## 2014-01-05 DIAGNOSIS — Z833 Family history of diabetes mellitus: Secondary | ICD-10-CM

## 2014-01-05 DIAGNOSIS — I6350 Cerebral infarction due to unspecified occlusion or stenosis of unspecified cerebral artery: Secondary | ICD-10-CM | POA: Diagnosis present

## 2014-01-05 DIAGNOSIS — K5900 Constipation, unspecified: Secondary | ICD-10-CM

## 2014-01-05 DIAGNOSIS — I959 Hypotension, unspecified: Secondary | ICD-10-CM | POA: Diagnosis present

## 2014-01-05 DIAGNOSIS — R5381 Other malaise: Secondary | ICD-10-CM | POA: Diagnosis present

## 2014-01-05 DIAGNOSIS — R509 Fever, unspecified: Secondary | ICD-10-CM

## 2014-01-05 DIAGNOSIS — E870 Hyperosmolality and hypernatremia: Secondary | ICD-10-CM | POA: Diagnosis not present

## 2014-01-05 DIAGNOSIS — I10 Essential (primary) hypertension: Secondary | ICD-10-CM

## 2014-01-05 DIAGNOSIS — K219 Gastro-esophageal reflux disease without esophagitis: Secondary | ICD-10-CM

## 2014-01-05 DIAGNOSIS — Z87891 Personal history of nicotine dependence: Secondary | ICD-10-CM

## 2014-01-05 DIAGNOSIS — I639 Cerebral infarction, unspecified: Secondary | ICD-10-CM

## 2014-01-05 DIAGNOSIS — F0390 Unspecified dementia without behavioral disturbance: Secondary | ICD-10-CM

## 2014-01-05 DIAGNOSIS — Z823 Family history of stroke: Secondary | ICD-10-CM

## 2014-01-05 DIAGNOSIS — Z7982 Long term (current) use of aspirin: Secondary | ICD-10-CM

## 2014-01-05 DIAGNOSIS — Z8673 Personal history of transient ischemic attack (TIA), and cerebral infarction without residual deficits: Secondary | ICD-10-CM

## 2014-01-05 DIAGNOSIS — I5033 Acute on chronic diastolic (congestive) heart failure: Secondary | ICD-10-CM | POA: Diagnosis present

## 2014-01-05 DIAGNOSIS — J690 Pneumonitis due to inhalation of food and vomit: Principal | ICD-10-CM | POA: Diagnosis present

## 2014-01-05 DIAGNOSIS — Z66 Do not resuscitate: Secondary | ICD-10-CM | POA: Diagnosis present

## 2014-01-05 DIAGNOSIS — G9340 Encephalopathy, unspecified: Secondary | ICD-10-CM | POA: Diagnosis present

## 2014-01-05 DIAGNOSIS — J9600 Acute respiratory failure, unspecified whether with hypoxia or hypercapnia: Secondary | ICD-10-CM | POA: Diagnosis present

## 2014-01-05 DIAGNOSIS — D649 Anemia, unspecified: Secondary | ICD-10-CM | POA: Diagnosis present

## 2014-01-05 DIAGNOSIS — J9610 Chronic respiratory failure, unspecified whether with hypoxia or hypercapnia: Secondary | ICD-10-CM

## 2014-01-05 DIAGNOSIS — E860 Dehydration: Secondary | ICD-10-CM

## 2014-01-05 DIAGNOSIS — F29 Unspecified psychosis not due to a substance or known physiological condition: Secondary | ICD-10-CM

## 2014-01-05 DIAGNOSIS — I4891 Unspecified atrial fibrillation: Secondary | ICD-10-CM

## 2014-01-05 DIAGNOSIS — R5383 Other fatigue: Secondary | ICD-10-CM

## 2014-01-05 DIAGNOSIS — I509 Heart failure, unspecified: Secondary | ICD-10-CM

## 2014-01-05 DIAGNOSIS — R079 Chest pain, unspecified: Secondary | ICD-10-CM

## 2014-01-05 DIAGNOSIS — I209 Angina pectoris, unspecified: Secondary | ICD-10-CM | POA: Diagnosis present

## 2014-01-05 DIAGNOSIS — E7800 Pure hypercholesterolemia, unspecified: Secondary | ICD-10-CM | POA: Diagnosis present

## 2014-01-05 DIAGNOSIS — Z87442 Personal history of urinary calculi: Secondary | ICD-10-CM

## 2014-01-05 DIAGNOSIS — Z8249 Family history of ischemic heart disease and other diseases of the circulatory system: Secondary | ICD-10-CM

## 2014-01-05 DIAGNOSIS — IMO0001 Reserved for inherently not codable concepts without codable children: Secondary | ICD-10-CM | POA: Diagnosis present

## 2014-01-05 DIAGNOSIS — J189 Pneumonia, unspecified organism: Secondary | ICD-10-CM

## 2014-01-05 DIAGNOSIS — R410 Disorientation, unspecified: Secondary | ICD-10-CM

## 2014-01-05 DIAGNOSIS — H9190 Unspecified hearing loss, unspecified ear: Secondary | ICD-10-CM | POA: Diagnosis present

## 2014-01-05 DIAGNOSIS — R1314 Dysphagia, pharyngoesophageal phase: Secondary | ICD-10-CM

## 2014-01-05 LAB — URINALYSIS, ROUTINE W REFLEX MICROSCOPIC
BILIRUBIN URINE: NEGATIVE
Glucose, UA: NEGATIVE mg/dL
Ketones, ur: 15 mg/dL — AB
Nitrite: NEGATIVE
PH: 6 (ref 5.0–8.0)
Protein, ur: 30 mg/dL — AB
SPECIFIC GRAVITY, URINE: 1.025 (ref 1.005–1.030)
Urobilinogen, UA: 0.2 mg/dL (ref 0.0–1.0)

## 2014-01-05 LAB — CBC WITH DIFFERENTIAL/PLATELET
Basophils Absolute: 0 10*3/uL (ref 0.0–0.1)
Basophils Relative: 0 % (ref 0–1)
EOS ABS: 0 10*3/uL (ref 0.0–0.7)
Eosinophils Relative: 0 % (ref 0–5)
HCT: 39.8 % (ref 36.0–46.0)
Hemoglobin: 12.4 g/dL (ref 12.0–15.0)
LYMPHS PCT: 16 % (ref 12–46)
Lymphs Abs: 1.6 10*3/uL (ref 0.7–4.0)
MCH: 25.1 pg — AB (ref 26.0–34.0)
MCHC: 31.2 g/dL (ref 30.0–36.0)
MCV: 80.4 fL (ref 78.0–100.0)
Monocytes Absolute: 1.3 10*3/uL — ABNORMAL HIGH (ref 0.1–1.0)
Monocytes Relative: 13 % — ABNORMAL HIGH (ref 3–12)
Neutro Abs: 7.3 10*3/uL (ref 1.7–7.7)
Neutrophils Relative %: 71 % (ref 43–77)
PLATELETS: 233 10*3/uL (ref 150–400)
RBC: 4.95 MIL/uL (ref 3.87–5.11)
RDW: 16.8 % — ABNORMAL HIGH (ref 11.5–15.5)
WBC: 10.2 10*3/uL (ref 4.0–10.5)

## 2014-01-05 LAB — URINE MICROSCOPIC-ADD ON

## 2014-01-05 LAB — COMPREHENSIVE METABOLIC PANEL
ALBUMIN: 2.5 g/dL — AB (ref 3.5–5.2)
ALT: 11 U/L (ref 0–35)
AST: 19 U/L (ref 0–37)
Alkaline Phosphatase: 63 U/L (ref 39–117)
BUN: 13 mg/dL (ref 6–23)
CO2: 28 mEq/L (ref 19–32)
Calcium: 8.6 mg/dL (ref 8.4–10.5)
Chloride: 98 mEq/L (ref 96–112)
Creatinine, Ser: 0.69 mg/dL (ref 0.50–1.10)
GFR calc non Af Amer: 73 mL/min — ABNORMAL LOW (ref >90)
GFR, EST AFRICAN AMERICAN: 85 mL/min — AB (ref >90)
GLUCOSE: 134 mg/dL — AB (ref 70–99)
POTASSIUM: 4.5 meq/L (ref 3.7–5.3)
SODIUM: 140 meq/L (ref 137–147)
TOTAL PROTEIN: 6.6 g/dL (ref 6.0–8.3)
Total Bilirubin: 0.5 mg/dL (ref 0.3–1.2)

## 2014-01-05 LAB — I-STAT CG4 LACTIC ACID, ED: Lactic Acid, Venous: 1.44 mmol/L (ref 0.5–2.2)

## 2014-01-05 LAB — MRSA PCR SCREENING: MRSA by PCR: POSITIVE — AB

## 2014-01-05 LAB — GLUCOSE, CAPILLARY: Glucose-Capillary: 117 mg/dL — ABNORMAL HIGH (ref 70–99)

## 2014-01-05 MED ORDER — ACETAMINOPHEN 500 MG PO TABS: 1000.0000 mg | ORAL_TABLET | Freq: Once | ORAL | Status: DC

## 2014-01-05 MED ORDER — SODIUM CHLORIDE 0.9 % IJ SOLN: 3.0000 mL | INTRAMUSCULAR | Status: DC | PRN

## 2014-01-05 MED ORDER — ASPIRIN EC 81 MG PO TBEC
81.0000 mg | DELAYED_RELEASE_TABLET | Freq: Every day | ORAL | Status: DC
  Administered 2014-01-06 – 2014-01-10 (×5): 81 mg via ORAL
  Filled 2014-01-05 (×6): qty 1

## 2014-01-05 MED ORDER — DEXTROSE 5 % IV SOLN
1.0000 g | INTRAVENOUS | Status: AC
  Administered 2014-01-05: 1 g via INTRAVENOUS
  Filled 2014-01-05: qty 1

## 2014-01-05 MED ORDER — IPRATROPIUM-ALBUTEROL 0.5-2.5 (3) MG/3ML IN SOLN
3.0000 mL | Freq: Three times a day (TID) | RESPIRATORY_TRACT | Status: DC
  Administered 2014-01-05 – 2014-01-06 (×3): 3 mL via RESPIRATORY_TRACT
  Filled 2014-01-05 (×2): qty 3

## 2014-01-05 MED ORDER — ENOXAPARIN SODIUM 40 MG/0.4ML ~~LOC~~ SOLN
40.0000 mg | SUBCUTANEOUS | Status: DC
  Administered 2014-01-05 – 2014-01-08 (×4): 40 mg via SUBCUTANEOUS
  Filled 2014-01-05 (×5): qty 0.4

## 2014-01-05 MED ORDER — SODIUM CHLORIDE 0.9 % IJ SOLN
3.0000 mL | Freq: Two times a day (BID) | INTRAMUSCULAR | Status: DC
  Administered 2014-01-05 – 2014-01-16 (×13): 3 mL via INTRAVENOUS

## 2014-01-05 MED ORDER — INSULIN ASPART 100 UNIT/ML ~~LOC~~ SOLN: 0.0000 [IU] | Freq: Every day | SUBCUTANEOUS | Status: DC

## 2014-01-05 MED ORDER — SODIUM CHLORIDE 0.9 % IV SOLN: 250.0000 mL | INTRAVENOUS | Status: DC | PRN

## 2014-01-05 MED ORDER — SODIUM CHLORIDE 0.9 % IV BOLUS (SEPSIS)
1000.0000 mL | Freq: Once | INTRAVENOUS | Status: AC
  Administered 2014-01-05: 1000 mL via INTRAVENOUS

## 2014-01-05 MED ORDER — DEXTROSE 5 % IV SOLN
1.0000 g | Freq: Two times a day (BID) | INTRAVENOUS | Status: AC
  Administered 2014-01-06 – 2014-01-12 (×15): 1 g via INTRAVENOUS
  Filled 2014-01-05 (×18): qty 1

## 2014-01-05 MED ORDER — BIOTENE DRY MOUTH MT LIQD
15.0000 mL | Freq: Two times a day (BID) | OROMUCOSAL | Status: DC
  Administered 2014-01-05 – 2014-01-16 (×19): 15 mL via OROMUCOSAL

## 2014-01-05 MED ORDER — VANCOMYCIN HCL 10 G IV SOLR: 2000.0000 mg | Freq: Once | INTRAVENOUS | Status: DC

## 2014-01-05 MED ORDER — DILTIAZEM HCL 100 MG IV SOLR
5.0000 mg/h | INTRAVENOUS | Status: DC
  Administered 2014-01-05 – 2014-01-07 (×3): 5 mg/h via INTRAVENOUS

## 2014-01-05 MED ORDER — METOPROLOL TARTRATE 12.5 MG HALF TABLET
12.5000 mg | ORAL_TABLET | Freq: Two times a day (BID) | ORAL | Status: DC
  Filled 2014-01-05 (×4): qty 1

## 2014-01-05 MED ORDER — INSULIN ASPART 100 UNIT/ML ~~LOC~~ SOLN
0.0000 [IU] | Freq: Three times a day (TID) | SUBCUTANEOUS | Status: DC
  Administered 2014-01-07 – 2014-01-08 (×3): 1 [IU] via SUBCUTANEOUS
  Administered 2014-01-09: 3 [IU] via SUBCUTANEOUS
  Administered 2014-01-10 (×2): 1 [IU] via SUBCUTANEOUS
  Administered 2014-01-10 – 2014-01-11 (×3): 2 [IU] via SUBCUTANEOUS
  Administered 2014-01-12: 1 [IU] via SUBCUTANEOUS
  Administered 2014-01-12 – 2014-01-13 (×3): 2 [IU] via SUBCUTANEOUS
  Administered 2014-01-14 (×2): 1 [IU] via SUBCUTANEOUS
  Administered 2014-01-14 – 2014-01-15 (×2): 2 [IU] via SUBCUTANEOUS
  Administered 2014-01-15: 1 [IU] via SUBCUTANEOUS
  Administered 2014-01-15: 2 [IU] via SUBCUTANEOUS

## 2014-01-05 MED ORDER — VANCOMYCIN HCL 10 G IV SOLR
1500.0000 mg | Freq: Once | INTRAVENOUS | Status: AC
  Administered 2014-01-05: 1500 mg via INTRAVENOUS
  Filled 2014-01-05: qty 1500

## 2014-01-05 MED ORDER — ACETAMINOPHEN 650 MG RE SUPP
650.0000 mg | Freq: Once | RECTAL | Status: AC
  Administered 2014-01-05: 650 mg via RECTAL
  Filled 2014-01-05: qty 1

## 2014-01-05 MED ORDER — DILTIAZEM HCL 25 MG/5ML IV SOLN
10.0000 mg | Freq: Once | INTRAVENOUS | Status: AC
  Administered 2014-01-05: 10 mg via INTRAVENOUS

## 2014-01-05 MED ORDER — VANCOMYCIN HCL IN DEXTROSE 750-5 MG/150ML-% IV SOLN
750.0000 mg | Freq: Two times a day (BID) | INTRAVENOUS | Status: DC
  Administered 2014-01-06 – 2014-01-09 (×8): 750 mg via INTRAVENOUS
  Filled 2014-01-05 (×9): qty 150

## 2014-01-05 MED ORDER — PANTOPRAZOLE SODIUM 40 MG PO TBEC
40.0000 mg | DELAYED_RELEASE_TABLET | Freq: Every day | ORAL | Status: DC
Start: 2014-01-05 — End: 2014-01-06
  Filled 2014-01-05: qty 1

## 2014-01-05 NOTE — H&P (Signed)
[facility] History and Physical  Savannah [name] WUJ:[id number] DOB: 03/22/1919 DOA: 01/05/2014  Referring physician: er PCP: Oneal GroutPANDEY, MAHIMA, MD   Chief Complaint: lethargy  HPI: Savannah [name] is a 80 y.o. female  From [name] long term SNF.  She was brought in after being found confused and less responsive.  For the last several weeks, patient has been getting weaker per daughter- unable to use walker to get to the bathroom.  She does endorse SOB but no fever/chills, no trouble eating, no dysuria but is very hard of hearing.  In the ER, she was found to have a PNA and was in a fib with RVR- family denies history.  She was also hypotensive but BP responded to IVF.  Family confirms she is a DNR.  In the ER she was given [facility] and cardizem gtt for atrial fibrillation.     Review of Systems:  Unable to do ROS due to dementia  Past Medical History  Diagnosis Date  . Hypertension   . Hypercholesteremia   . Pelvis fracture   . Kidney stones   . Hyperglycemia   . Diabetes mellitus without complication   . CVA (cerebral infarction)   . Hypoxia    Past Surgical History  Procedure Laterality Date  . Abdominal hysterectomy     Social History:  reports that she has quit smoking. She does not have any smokeless tobacco history on file. She reports that she does not drink alcohol or use illicit drugs.  No Known Allergies  Family History  Problem Relation Age of Onset  . Heart disease Mother   . Heart disease Father   . Diabetes Sister   . Stroke Brother      Prior to Admission medications   Medication Sig Start Date End Date Taking? Authorizing Provider  aspirin EC 81 MG tablet Take 81 mg by mouth daily.   Yes Historical Provider, MD  calcium-vitamin D (OSCAL WITH D) 500-200 MG-UNIT per tablet Take 1 tablet by mouth daily.   Yes Historical Provider, MD  Cholecalciferol (VITAMIN D-3) 1000 UNITS CAPS Take 2,000 Units by mouth daily.    Yes Historical Provider, MD    Docusate Sodium (DSS) 100 MG CAPS Take 100 mg by mouth 2 (two) times daily. 12/28/12  Yes Tora KindredMarianne L York, PA-C  ferrous sulfate 325 (65 FE) MG tablet Take 325 mg by mouth daily with breakfast.   Yes Historical Provider, MD  ipratropium-albuterol (DUONEB) 0.5-2.5 (3) MG/3ML SOLN Take 3 mLs by nebulization 3 (three) times daily.    Yes Historical Provider, MD  metoprolol tartrate (LOPRESSOR) 25 MG tablet Take 12.5 mg by mouth 2 (two) times daily.   Yes Historical Provider, MD  Multiple Vitamin (MULTIVITAMIN WITH MINERALS) TABS tablet Take 1 tablet by mouth daily.   Yes Historical Provider, MD  omeprazole (PRILOSEC) 20 MG capsule Take 20 mg by mouth daily.   Yes Historical Provider, MD  polyethylene glycol (MIRALAX / GLYCOLAX) packet Take 17 g by mouth every evening. 12/28/12  Yes Marianne L York, PA-C  saccharomyces boulardii (FLORASTOR) 250 MG capsule Take 250 mg by mouth 2 (two) times daily.   Yes Historical Provider, MD  tiotropium (SPIRIVA) 18 MCG inhalation capsule Place 18 mcg into inhaler and inhale every evening.   Yes Historical Provider, MD  vitamin B-12 (CYANOCOBALAMIN) 1000 MCG tablet Place 1,000 mcg under the tongue daily.    Historical Provider, MD   Physical Exam: Filed Vitals:   01/05/14 1300  BP: 120/66  Pulse: 103  Temp:   Resp: 28    BP 120/66  Pulse 103  Temp(Src) 100.7 F (38.2 C) (Rectal)  Resp 28  SpO2 95%  General:  Appears calm and comfortable Eyes: PERRL, normal lids, irises & conjunctiva ENT: grossly normal hearing, lips & tongue Neck: no LAD, masses or thyromegaly Cardiovascular: irregular but rate controlled Respiratory: Coarse breath sounds, no wheezing. Abdomen: soft, ntnd Skin: multiple areas of bruising Musculoskeletal: weak through out extremities no foacl Psychiatric: grossly normal mood and affect, VERY hard of hearing Neurologic: grossly non-focal.          Labs on Admission:  Basic Metabolic Panel:  Recent Labs Lab 01/05/14 0936  NA  140  K 4.5  CL 98  CO2 28  GLUCOSE 134*  BUN 13  CREATININE 0.69  CALCIUM 8.6   Liver Function Tests:  Recent Labs Lab 01/05/14 0936  AST 19  ALT 11  ALKPHOS 63  BILITOT 0.5  PROT 6.6  ALBUMIN 2.5*   No results found for this basename: LIPASE, AMYLASE,  in the last 168 hours No results found for this basename: AMMONIA,  in the last 168 hours CBC:  Recent Labs Lab 01/05/14 0936  WBC 10.2  NEUTROABS 7.3  HGB 12.4  HCT 39.8  MCV 80.4  PLT 233   Cardiac Enzymes: No results found for this basename: CKTOTAL, CKMB, CKMBINDEX, TROPONINI,  in the last 168 hours  BNP (last 3 results)  Recent Labs  08/23/13 2224  PROBNP 184.6   CBG: No results found for this basename: GLUCAP,  in the last 168 hours  Radiological Exams on Admission: Dg Abd Acute W/chest  01/05/2014   CLINICAL DATA:  Atrial fibrillation, question free abdominal air  EXAM: ACUTE ABDOMEN SERIES (ABDOMEN 2 VIEW & CHEST 1 VIEW)  COMPARISON:  08/24/2013  FINDINGS: Cardiomegaly again noted. There is bilateral basilar atelectasis or infiltrate. No pulmonary edema.  No free abdominal air is noted. Mild distended bowel loops in right abdomen suspicious for ileus, partial obstruction or enteritis.  IMPRESSION: Bilateral basilar atelectasis or infiltrate. No pulmonary edema. Mild distended bowel loops in right abdomen suspicious for ileus, partial obstruction or enteritis. No free abdominal air.   Electronically Signed   By: Natasha Mead M.D.   On: 01/05/2014 10:22    EKG: Independently reviewed. A fib with RVR  Assessment/Plan Active Problems:   Type II or unspecified type diabetes mellitus without mention of complication, uncontrolled   HCAP (healthcare-associated pneumonia)   Atrial fibrillation   Dementia   1. Lethargy- due to infection- patient has already improved in ER 2. HCAPNA- IV abx, nebs PRN, blood cultures pending 3. A fib with RVR- IV cardizem, wean off as tolerated- on metoprolol- give AM DOSE,  echo, do no think patient is a candidate for anticoagulation;  4. ? Aspiration- get SLP eval 5. DM- SSI, diabetic diet 6. Dementia- stable- back to SNF once better     Code Status: DNR Family Communication: daughter at bedside Disposition Plan: SDU  Time spent: 75 min  Marlin Canary [facility] Pager [phone]

## 2014-01-05 NOTE — ED Notes (Signed)
Attempted PIV in right hand; pt jerked hand away pulling out cath; bruising noted; pressure dressing applied

## 2014-01-05 NOTE — ED Notes (Signed)
Per EMS: pt from SNF c/o decreased LOC and new onset afib; pt noted to have rhonchi through all fields and is distressed; pt warm to touch; pt with some cough; pt alert to person only at present

## 2014-01-05 NOTE — ED Provider Notes (Signed)
CSN: [id number]     Arrival date & time 01/05/14  3220 History   First MD Initiated Contact with Patient 01/05/14 [phone]     No chief complaint on file.    (Consider location/radiation/quality/duration/timing/severity/associated sxs/prior Treatment) Patient is a 80 y.o. female presenting with altered mental status. The history is provided by the patient, the EMS personnel and a relative.  Altered Mental Status Presenting symptoms: confusion and partial responsiveness   Severity:  Mild Most recent episode:  Today Episode history:  Single Timing:  Constant Progression:  Worsening Chronicity:  New Context: nursing home resident   Context: not dementia   Associated symptoms: difficulty breathing and fever   Associated symptoms: no rash and no vomiting     Past Medical History  Diagnosis Date  . Hypertension   . Hypercholesteremia   . Pelvis fracture   . Kidney stones   . Hyperglycemia   . Diabetes mellitus without complication   . CVA (cerebral infarction)   . Hypoxia    Past Surgical History  Procedure Laterality Date  . Abdominal hysterectomy     Family History  Problem Relation Age of Onset  . Heart disease Mother   . Heart disease Father   . Diabetes Sister   . Stroke Brother    History  Substance Use Topics  . Smoking status: Former Games developer  . Smokeless tobacco: Not on file  . Alcohol Use: No   OB History   Grav Para Term Preterm Abortions TAB SAB Ect Mult Living                 Review of Systems  Unable to perform ROS: Acuity of condition  Constitutional: Positive for fever. Negative for chills.  Respiratory: Positive for cough and shortness of breath.   Cardiovascular: Negative for leg swelling.  Gastrointestinal: Negative for vomiting.  Skin: Negative for rash.  Psychiatric/Behavioral: Positive for confusion.      Allergies  Review of patient's allergies indicates no known allergies.  Home Medications   Current Outpatient Rx  Name  Route  Sig   Dispense  Refill  . aspirin EC 81 MG tablet   Oral   Take 81 mg by mouth daily.         Marland Kitchen azithromycin (ZITHROMAX) 500 MG tablet   Oral   Take 1 tablet (500 mg total) by mouth daily.   7 tablet   0   . beta carotene w/minerals (OCUVITE) tablet   Oral   Take 1 tablet by mouth daily.         . calcium-vitamin D (OSCAL WITH D) 500-200 MG-UNIT per tablet   Oral   Take 1 tablet by mouth daily.         . cefdinir (OMNICEF) 300 MG capsule   Oral   Take 1 capsule (300 mg total) by mouth 2 (two) times daily.   14 capsule   0   . Cholecalciferol (VITAMIN D-3) 1000 UNITS CAPS   Oral   Take 1,000 Units by mouth daily.         Tery Sanfilippo Sodium (DSS) 100 MG CAPS   Oral   Take 100 mg by mouth 2 (two) times daily.         Marland Kitchen ipratropium-albuterol (DUONEB) 0.5-2.5 (3) MG/3ML SOLN   Nebulization   Take 3 mLs by nebulization every 4 (four) hours as needed.         . metoprolol tartrate (LOPRESSOR) 25 MG tablet   Oral   Take  12.5 mg by mouth 2 (two) times daily.         Marland Kitchen omeprazole (PRILOSEC) 20 MG capsule   Oral   Take 20 mg by mouth daily.         . polyethylene glycol (MIRALAX / GLYCOLAX) packet   Oral   Take 17 g by mouth daily.   14 each   0   . predniSONE (STERAPRED UNI-PAK) 10 MG tablet   Oral   Take 1 tablet (10 mg total) by mouth daily. 60mg  po qday x 3 days, then 40mg  po x 3 days, then 20mg  po qday x 3 days, then 10mg  po qday x 3 days, then 5mg  po qday x 3 days, then stop, zero refills          There were no vitals taken for this visit. Physical Exam  Nursing note and vitals reviewed. Constitutional: She is oriented to person, place, and time. She appears well-developed and well-nourished. No distress.  HENT:  Head: Normocephalic and atraumatic.  Eyes: EOM are normal. Pupils are equal, round, and reactive to light.  Neck: Normal range of motion. Neck supple.  Cardiovascular: Normal rate and regular rhythm.  Exam reveals no friction rub.   No  murmur heard. Pulmonary/Chest: Effort normal and breath sounds normal. No respiratory distress. She has no wheezes. She has no rales.  Abdominal: Soft. She exhibits no distension. There is no tenderness. There is no rebound.  Musculoskeletal: Normal range of motion. She exhibits no edema.  Neurological: She is alert and oriented to person, place, and time. No cranial nerve deficit. She exhibits normal muscle tone. Coordination normal.  Skin: No rash noted. She is not diaphoretic.    ED Course  Procedures (including critical care time) Labs Review Labs Reviewed  CBC WITH DIFFERENTIAL - Abnormal; Notable for the following:    MCH 25.1 (*)    RDW 16.8 (*)    Monocytes Relative 13 (*)    Monocytes Absolute 1.3 (*)    All other components within normal limits  COMPREHENSIVE METABOLIC PANEL - Abnormal; Notable for the following:    Glucose, Bld 134 (*)    Albumin 2.5 (*)    GFR calc non Af Amer 73 (*)    GFR calc Af Amer 85 (*)    All other components within normal limits  URINALYSIS, ROUTINE W REFLEX MICROSCOPIC - Abnormal; Notable for the following:    Hgb urine dipstick SMALL (*)    Ketones, ur 15 (*)    Protein, ur 30 (*)    Leukocytes, UA TRACE (*)    All other components within normal limits  URINE MICROSCOPIC-ADD ON - Abnormal; Notable for the following:    Squamous Epithelial / LPF FEW (*)    Bacteria, UA FEW (*)    All other components within normal limits  CULTURE, BLOOD (ROUTINE X 2)  CULTURE, BLOOD (ROUTINE X 2)  URINE CULTURE  I-STAT CG4 LACTIC ACID, ED   Imaging Review No results found.  EKG Interpretation    Date/Time:  Thursday January 05 2014 09:37:21 EST Ventricular Rate:  124 PR Interval:    QRS Duration: 84 QT Interval:  318 QTC Calculation: 457 R Axis:   41 Text Interpretation:  Atrial flutter with 2:1 AV block Afib/flutter is new No ischemic changes Confirmed by Gwendolyn Grant  MD, Aubrianne Molyneux ([id number]) on 01/05/2014 10:25:55 AM           CRITICAL  CARE Performed by: Dagmar Hait   Total critical care  time: 30 minutes  Critical care time was exclusive of separately billable procedures and treating other patients.  Critical care was necessary to treat or prevent imminent or life-threatening deterioration.  Critical care was time spent personally by me on the following activities: development of treatment plan with patient and/or surrogate as well as nursing, discussions with consultants, evaluation of patient's response to treatment, examination of patient, obtaining history from patient or surrogate, ordering and performing treatments and interventions, ordering and review of laboratory studies, ordering and review of radiographic studies, pulse oximetry and re-evaluation of patient's condition.  MDM   Final diagnoses:  HCAP (healthcare-associated pneumonia)  Atrial fibrillation with RVR  Fever    25F from a nursing facility called out for unresponsiveness. Hx of HTN, HLD, CVA, DM. EMS found her to be in new onset afib. Also found to be hypotensive with EMS, 80s/30s, received 500cc of saline with them. Patient talking, intermittently following commands. Wet, junky sounds coming from her airway, no respiratory distress. Lungs with right basilar rhonchi, no other adventitious sounds.  Patient has DNR order here. Sepsis workup initiated, will give HCAP antibiotics.  Despite fluids and fever control, still in Afib. Given Dilt bolus and placed on Dilt drip. Explained situation with daughter, who is at bedside. Patient admitted to stepdown unit.  Dagmar HaitWilliam Mayzee Reichenbach, MD 01/05/14 1255

## 2014-01-05 NOTE — Progress Notes (Signed)
Utilization review completed.  

## 2014-01-05 NOTE — ED Notes (Signed)
Blood cultures x 2 collected by phlebotomy

## 2014-01-05 NOTE — ED Notes (Signed)
hospitalist at the bedside 

## 2014-01-05 NOTE — Progress Notes (Signed)
Pt converted to SR on the monitor. Will leave card gtt at 5 since PO intake is questionable. Pt lethargic, confused.

## 2014-01-05 NOTE — Progress Notes (Addendum)
ANTIBIOTIC CONSULT NOTE - INITIAL  Pharmacy Consult for vancomycin and cefepime Indication: sepsis  No Known Allergies  Patient Measurements: 76.8 kg  Vital Signs: Temp: 100.7 F (38.2 C) (02/26 0942) Temp src: Rectal (02/26 0942) BP: 147/89 mmHg (02/26 0945) Pulse Rate: 120 (02/26 0945) Intake/Output from previous day:   Intake/Output from this shift: Total I/O In: 500 [I.V.:500] Out: -   Labs:  Recent Labs  01/05/14 0936  WBC 10.2  HGB 12.4  PLT 233   The CrCl is unknown because both a height and weight (above a minimum accepted value) are required for this calculation. No results found for this basename: VANCOTROUGH, VANCOPEAK, VANCORANDOM, GENTTROUGH, GENTPEAK, GENTRANDOM, TOBRATROUGH, TOBRAPEAK, TOBRARND, AMIKACINPEAK, AMIKACINTROU, AMIKACIN,  in the last 72 hours   Microbiology: No results found for this or any previous visit (from the past 720 hour(s)).  Medical History: Past Medical History  Diagnosis Date  . Hypertension   . Hypercholesteremia   . Pelvis fracture   . Kidney stones   . Hyperglycemia   . Diabetes mellitus without complication   . CVA (cerebral infarction)   . Hypoxia     Medications:  See PTA medication list  Assessment: 80 y/o female SNF resident who presents to the ED with decreased LOC and new onset Afib. MD notes lungs with R basilar rhonchi. CXR concerning for bilateral infiltrate. Pharmacy consulted to begin vancomycin and cefepime for sepsis. WBC are wnl, patient is febrile, and blood and urine cultures are pending. CMP is in process but renal function normal in past.  Goal of Therapy:  Vancomycin trough level 15-20 mcg/ml  Plan:  -Vancomycin 1500 mg IV now -Cefepime 1 g IV now -Maintenance doses to be determined when CMP results  [facility] HospitalJennifer Robert Lee, 17[address] Rainbow BoulevardPharm.D., BCPS Clinical Pharmacist Pager: [phone] 01/05/2014 10:30 AM   Addendum: SCr is normal at 0.69. Estimated CrCl ~54 ml/min.  Begin cefepime 1 g IV q12h Begin  vancomycin 750 mg IV q12h  [facility] HospitalJennifer Rogers, [place] Hot SpringsPharm.D., BCPS Clinical Pharmacist Pager: [phone] 01/05/2014 11:48 AM

## 2014-01-05 NOTE — Progress Notes (Signed)
Pt arrived from ED, afib, on cardizem gtt, will continue to monitor.

## 2014-01-05 NOTE — ED Notes (Signed)
Lactic acid results told to primary nurse Brett CanalesSteve

## 2014-01-06 ENCOUNTER — Inpatient Hospital Stay (HOSPITAL_COMMUNITY): Payer: PRIVATE HEALTH INSURANCE

## 2014-01-06 DIAGNOSIS — J9600 Acute respiratory failure, unspecified whether with hypoxia or hypercapnia: Secondary | ICD-10-CM

## 2014-01-06 DIAGNOSIS — I359 Nonrheumatic aortic valve disorder, unspecified: Secondary | ICD-10-CM

## 2014-01-06 DIAGNOSIS — I509 Heart failure, unspecified: Secondary | ICD-10-CM

## 2014-01-06 DIAGNOSIS — I5033 Acute on chronic diastolic (congestive) heart failure: Secondary | ICD-10-CM | POA: Diagnosis present

## 2014-01-06 LAB — URINE CULTURE
CULTURE: NO GROWTH
Colony Count: NO GROWTH

## 2014-01-06 LAB — GLUCOSE, CAPILLARY
GLUCOSE-CAPILLARY: 107 mg/dL — AB (ref 70–99)
Glucose-Capillary: 109 mg/dL — ABNORMAL HIGH (ref 70–99)
Glucose-Capillary: 118 mg/dL — ABNORMAL HIGH (ref 70–99)
Glucose-Capillary: 108 mg/dL — ABNORMAL HIGH (ref 70–99)

## 2014-01-06 LAB — INFLUENZA PANEL BY PCR (TYPE A & B)
H1N1 flu by pcr: NOT DETECTED
INFLAPCR: NEGATIVE
INFLBPCR: NEGATIVE

## 2014-01-06 LAB — BASIC METABOLIC PANEL
BUN: 11 mg/dL (ref 6–23)
CO2: 29 mEq/L (ref 19–32)
Calcium: 8.4 mg/dL (ref 8.4–10.5)
Chloride: 103 mEq/L (ref 96–112)
Creatinine, Ser: 0.66 mg/dL (ref 0.50–1.10)
GFR calc Af Amer: 86 mL/min — ABNORMAL LOW (ref >90)
GFR calc non Af Amer: 74 mL/min — ABNORMAL LOW (ref >90)
Glucose, Bld: 120 mg/dL — ABNORMAL HIGH (ref 70–99)
Potassium: 4.5 mEq/L (ref 3.7–5.3)
Sodium: 142 mEq/L (ref 137–147)

## 2014-01-06 MED ORDER — SODIUM CHLORIDE 0.9 % IV SOLN: 250.0000 mL | INTRAVENOUS | Status: DC | PRN

## 2014-01-06 MED ORDER — IPRATROPIUM-ALBUTEROL 0.5-2.5 (3) MG/3ML IN SOLN
3.0000 mL | Freq: Four times a day (QID) | RESPIRATORY_TRACT | Status: DC | PRN
  Administered 2014-01-06 – 2014-01-15 (×4): 3 mL via RESPIRATORY_TRACT
  Filled 2014-01-06 (×4): qty 3

## 2014-01-06 MED ORDER — METOPROLOL TARTRATE 1 MG/ML IV SOLN
5.0000 mg | Freq: Four times a day (QID) | INTRAVENOUS | Status: DC
  Administered 2014-01-06 – 2014-01-08 (×9): 5 mg via INTRAVENOUS
  Filled 2014-01-06 (×11): qty 5

## 2014-01-06 MED ORDER — PANTOPRAZOLE SODIUM 40 MG IV SOLR
40.0000 mg | Freq: Two times a day (BID) | INTRAVENOUS | Status: DC
  Administered 2014-01-06 – 2014-01-09 (×7): 40 mg via INTRAVENOUS
  Filled 2014-01-06 (×10): qty 40

## 2014-01-06 MED ORDER — FUROSEMIDE 10 MG/ML IJ SOLN
40.0000 mg | Freq: Three times a day (TID) | INTRAMUSCULAR | Status: DC
  Administered 2014-01-06 – 2014-01-08 (×6): 40 mg via INTRAVENOUS
  Filled 2014-01-06 (×9): qty 4

## 2014-01-06 NOTE — Progress Notes (Signed)
Echocardiogram 2D Echocardiogram has been performed.  Ranetta Armacost 01/06/2014, 9:04 AM

## 2014-01-06 NOTE — Progress Notes (Signed)
[facility] Progress Note [place] TEAM 1 - Stepdown/ICU TEAM   Savannah [name] WJX:[id number] DOB: 1919/05/27 DOA: 01/05/2014 PCP: Oneal Grout, MD  Brief narrative: Savannah [name] is a 80 y.o. female presenting on 01/05/2014 with  has a past medical history of Hypertension; Hypercholesteremia; Pelvis fracture; Kidney stones; Diabetes mellitus without complication; CVA (cerebral infarction) who presents from [name]. She was brought in after being found confused and less responsive. For the last several weeks, patient has been getting weaker per daughter- unable to use walker to get to the bathroom. She does endorse SOB and cough but no fever/chills, no trouble eating, no dysuria. Very hard of hearing.  She was diagnosed with PNA in the ER and noted to have A-fib with RVR and admitted to SDU for Cardizem infusion.   Subjective: Cough with on and off central chest pain.   Assessment/Plan: Principal Problem:   Acute respiratory failure - likely due to CHF from uncontrolled HR and diastolic dysf  - will cont antibiotics however for possible HCAP- will cont the SLP eval - stop IVF and start aggressive diuresis  Active Problems:   Atrial fibrillation - newly diagnosed and now converted back to NSR - ECHO completed- see below - not a candidate for anticoagulation as often feels like she may fall (per granddaughter) - will cont ASA 81mg     Diastolic CHF, acute on chronic - ECHO reveals LVH- diuresis as above  HTN - cont Metoprolol but via IV    Type II or unspecified type diabetes mellitus without mention of complication, uncontrolled - not on medication as outpt    Dementia  Nutrition - NPO for now until passes swallow eval   Code Status: DNR Family Communication: Granddaughter at bedside Disposition Plan: follow in SDU today  Consultants: none  Procedures: none  Antibiotics: Antibiotics Given (last 72 hours)   Date/Time Action Medication Dose  Rate   01/06/14 0015 Given   ceFEPIme (MAXIPIME) 1 g in dextrose 5 % 50 mL IVPB 1 g 100 mL/hr   01/06/14 0139 Given   vancomycin (VANCOCIN) IVPB 750 mg/150 ml premix 750 mg 150 mL/hr   01/06/14 1033 Given   ceFEPIme (MAXIPIME) 1 g in dextrose 5 % 50 mL IVPB 1 g 100 mL/hr       DVT prophylaxis: Lovenox  Objective: Filed Weights   01/05/14 1347  Weight: 82.7 kg (182 lb 5.1 oz)   Blood pressure 171/72, pulse 96, temperature 97.7 F (36.5 C), temperature source Oral, resp. rate 28, height 5\' 5"  (1.651 m), weight 82.7 kg (182 lb 5.1 oz), SpO2 94.00%.  Intake/Output Summary (Last 24 hours) at 01/06/14 1403 Last data filed at 01/06/14 0400  Gross per 24 hour  Intake 275.75 ml  Output      0 ml  Net 275.75 ml     Exam: General: sleepy but awakable- oreinted x 3, No acute respiratory distress Lungs: Clear to auscultation bilaterally without wheezes or crackles- very poor air entry- congested cough- on 4 L O2- 94% pulse ox Cardiovascular: Regular rate and rhythm without murmur gallop or rub normal S1 and S2 Abdomen: Nontender, nondistended, soft, bowel sounds positive, no rebound, no ascites, no appreciable mass Extremities: No significant cyanosis, clubbing, or edema bilateral lower extremities  Data Reviewed: Basic Metabolic Panel:  Recent Labs Lab 01/05/14 0936 01/06/14 0630  NA 140 142  K 4.5 4.5  CL 98 103  CO2 28 29  GLUCOSE 134* 120*  BUN 13 11  CREATININE 0.69 0.66  CALCIUM 8.6 8.4   Liver Function Tests:  Recent Labs Lab 01/05/14 0936  AST 19  ALT 11  ALKPHOS 63  BILITOT 0.5  PROT 6.6  ALBUMIN 2.5*   No results found for this basename: LIPASE, AMYLASE,  in the last 168 hours No results found for this basename: AMMONIA,  in the last 168 hours CBC:  Recent Labs Lab 01/05/14 0936  WBC 10.2  NEUTROABS 7.3  HGB 12.4  HCT 39.8  MCV 80.4  PLT 233   Cardiac Enzymes: No results found for this basename: CKTOTAL, CKMB, CKMBINDEX, TROPONINI,  in the  last 168 hours BNP (last 3 results)  Recent Labs  08/23/13 2224  PROBNP 184.6   CBG:  Recent Labs Lab 01/05/14 1712 01/05/14 2142 01/06/14 0800 01/06/14 1155  GLUCAP 117* 109* 107* 118*    Recent Results (from the past 240 hour(s))  URINE CULTURE     Status: None   Collection Time    01/05/14  9:43 AM      Result Value Ref Range Status   Specimen Description URINE, RANDOM   Final   Special Requests NONE   Final   Culture  Setup Time     Final   Value: 01/05/2014 14:52     Performed at [facility] Count     Final   Value: NO GROWTH     Performed at [facility]   Culture     Final   Value: NO GROWTH     Performed at [facility]   Report Status 01/06/2014 FINAL   Final  CULTURE, BLOOD (ROUTINE X 2)     Status: None   Collection Time    01/05/14 10:14 AM      Result Value Ref Range Status   Specimen Description BLOOD RIGHT ANTECUBITAL   Final   Special Requests BOTTLES DRAWN AEROBIC AND ANAEROBIC 10CCS   Final   Culture  Setup Time     Final   Value: 01/05/2014 14:37     Performed at [facility]   Culture     Final   Value:        BLOOD CULTURE RECEIVED NO GROWTH TO DATE CULTURE WILL BE HELD FOR 5 DAYS BEFORE ISSUING A FINAL NEGATIVE REPORT     Performed at [facility]   Report Status PENDING   Incomplete  CULTURE, BLOOD (ROUTINE X 2)     Status: None   Collection Time    01/05/14 10:26 AM      Result Value Ref Range Status   Specimen Description BLOOD LEFT ANTECUBITAL   Final   Special Requests BOTTLES DRAWN AEROBIC AND ANAEROBIC 5CCS   Final   Culture  Setup Time     Final   Value: 01/05/2014 14:37     Performed at [facility]   Culture     Final   Value:        BLOOD CULTURE RECEIVED NO GROWTH TO DATE CULTURE WILL BE HELD FOR 5 DAYS BEFORE ISSUING A FINAL NEGATIVE REPORT     Performed at [facility]   Report Status PENDING   Incomplete  MRSA PCR SCREENING     Status: Abnormal    Collection Time    01/05/14  1:47 PM      Result Value Ref Range Status   MRSA by PCR POSITIVE (*) NEGATIVE Final   Comment:  The GeneXpert MRSA Assay (FDA     approved for NASAL specimens     only), is one component of a     comprehensive MRSA colonization     surveillance program. It is not     intended to diagnose MRSA     infection nor to guide or     monitor treatment for     MRSA infections.     RESULT CALLED TO, READ BACK BY AND VERIFIED WITH:     [facility] RN 16:40 01/05/14 ([email or web address])     Studies:  Recent x-ray studies have been reviewed in detail by the Attending Physician  Scheduled Meds:  Scheduled Meds: . antiseptic oral rinse  15 mL Mouth Rinse BID  . aspirin EC  81 mg Oral Daily  . ceFEPime (MAXIPIME) IV  1 g Intravenous Q12H  . enoxaparin (LOVENOX) injection  40 mg Subcutaneous Q24H  . furosemide  40 mg Intravenous 3 times per day  . insulin aspart  0-5 Units Subcutaneous QHS  . insulin aspart  0-9 Units Subcutaneous TID WC  . metoprolol  5 mg Intravenous 4 times per day  . pantoprazole (PROTONIX) IV  40 mg Intravenous Q12H  . sodium chloride  3 mL Intravenous Q12H  . vancomycin  750 mg Intravenous Q12H   Continuous Infusions: . diltiazem (CARDIZEM) infusion 10 mg/hr (01/06/14 1107)    Time spent on care of this patient: > 3264m in   [facility] Oklahoma CityRIZWAN,Beula Joyner, MD  [facility] Office  [phone] Pager - Text Page per Loretha StaplerAmion as per below:  On-Call/Text Page:      [email or web address]      password [email or web address]  If 7PM-7AM, please contact night-coverage [email or web address] Password [email or web address] 01/06/2014, 2:03 PM   LOS: 1 day

## 2014-01-06 NOTE — Progress Notes (Signed)
INITIAL NUTRITION ASSESSMENT  DOCUMENTATION CODES Per approved criteria  -Obesity Unspecified   INTERVENTION:  If TF started, recommend Glucerna 1.2 formula -- initiate at 20 ml/hr and increase by 10 ml every 4 hours to goal rate of 60 ml/hr to provide 1728 kcals, 86 gm protein, 1159 ml of free water RD to follow for nutrition care plan  NUTRITION DIAGNOSIS: Inadequate oral intake related to inability to eat as evidenced by NPO status  Goal: Pt to meet >/= 90% of their estimated nutrition needs   Monitor:  PO diet advancement vs TF initiation, weight, labs, I/O's  Reason for Assessment: Low Braden  80 y.o. female  Admitting Dx: Acute respiratory failure  ASSESSMENT: Patient from [name] long term [facility]. She was brought in after being found confused and less responsive. For the last several weeks, patient has been getting weaker per daughter- unable to use walker to get to the bathroom. She does endorse SOB but no fever/chills, no trouble eating, no dysuria but is very hard of hearing.   In the ER, she was found to have a PNA and was in a fib with RVR- family denies history. She was also hypotensive but BP responded to IVF. Family confirms she is a DNR. [facility] and cardizem gtt for atrial fibrillation.   RD unable to obtain nutrition hx from patient; no family at bedside; s/p bedside swallow evaluation this AM -- pt currently no same for PO's; RD unable to determine diet type PTA; SLP recommending NPO status at this time; RD questions whether pt will need short-term nutrition support.  Low braden score places patient at risk for skin breakdown.  Nutrition focused physical exam completed.  No muscle or subcutaneous fat depletion noticed.  Height: Ht Readings from Last 1 Encounters:  01/05/14 5\' 5"  (1.651 m)    Weight: Wt Readings from Last 1 Encounters:  01/05/14 182 lb 5.1 oz (82.7 kg)    Ideal Body Weight: 125 lb  % Ideal Body Weight:  145%  Wt Readings from Last 10 Encounters:  01/05/14 182 lb 5.1 oz (82.7 kg)  08/24/13 169 lb 5 oz (76.8 kg)  06/18/13 168 lb 3.2 oz (76.295 kg)  01/07/13 174 lb 2.6 oz (79 kg)  12/26/12 168 lb 4.8 oz (76.34 kg)    Usual Body Weight: 169 lb  % Usual Body Weight: 109%  BMI:  Body mass index is 30.34 kg/(m^2).  Estimated Nutritional Needs: Kcal: 1700-1900 Protein: 80-90 gm Fluid: 1.7-1.9 L  Skin: Intact  Diet Order: NPO  EDUCATION NEEDS: -No education needs identified at this time   Intake/Output Summary (Last 24 hours) at 01/06/14 1451 Last data filed at 01/06/14 0400  Gross per 24 hour  Intake 275.75 ml  Output      0 ml  Net 275.75 ml   Labs:   Recent Labs Lab 01/05/14 0936 01/06/14 0630  NA 140 142  K 4.5 4.5  CL 98 103  CO2 28 29  BUN 13 11  CREATININE 0.69 0.66  CALCIUM 8.6 8.4  GLUCOSE 134* 120*    CBG (last 3)   Recent Labs  01/05/14 2142 01/06/14 0800 01/06/14 1155  GLUCAP 109* 107* 118*    Scheduled Meds: . antiseptic oral rinse  15 mL Mouth Rinse BID  . aspirin EC  81 mg Oral Daily  . ceFEPime (MAXIPIME) IV  1 g Intravenous Q12H  . enoxaparin (LOVENOX) injection  40 mg Subcutaneous Q24H  .  furosemide  40 mg Intravenous 3 times per day  . insulin aspart  0-5 Units Subcutaneous QHS  . insulin aspart  0-9 Units Subcutaneous TID WC  . metoprolol  5 mg Intravenous 4 times per day  . pantoprazole (PROTONIX) IV  40 mg Intravenous Q12H  . sodium chloride  3 mL Intravenous Q12H  . vancomycin  750 mg Intravenous Q12H    Continuous Infusions: . diltiazem (CARDIZEM) infusion 10 mg/hr (01/06/14 1107)    Past Medical History  Diagnosis Date  . Hypertension   . Hypercholesteremia   . Pelvis fracture   . Kidney stones   . Hyperglycemia   . Diabetes mellitus without complication   . CVA (cerebral infarction)   . Hypoxia     Past Surgical History  Procedure Laterality Date  . Abdominal hysterectomy      Maureen Chatters, RD,  LDN Pager #: [phone] After-Hours Pager #: [phone]

## 2014-01-06 NOTE — Evaluation (Signed)
Clinical/Bedside Swallow Evaluation Patient Details  Name: Savannah [name] MRN: [id number] Date of Birth: 08/14/1919  Today's Date: 01/06/2014 Time: 4098-1191 SLP Time Calculation (min): 53 min  Past Medical History:  Past Medical History  Diagnosis Date  . Hypertension   . Hypercholesteremia   . Pelvis fracture   . Kidney stones   . Hyperglycemia   . Diabetes mellitus without complication   . CVA (cerebral infarction)   . Hypoxia    Past Surgical History:  Past Surgical History  Procedure Laterality Date  . Abdominal hysterectomy     HPI:  From [name] long term [facility].  She was brought in after being found confused and less responsive.  For the last several weeks, patient has been getting weaker per daughter- unable to use walker to get to the bathroom.  She does endorse SOB but no fever/chills, no trouble eating, no dysuria but is very hard of hearing.  In the ER, she was found to have a PNA and was in a fib with RVR- family denies history.  She was also hypotensive but BP responded to IVF.  Family confirms she is a DNR.  In the ER she was given abx for [facility] and cardizem gtt for atrial fibrillation.   Assessment / Plan / Recommendation Clinical Impression  Patient currently with increased WOB, resulting in difficulty coordinating the swallow with respiration, and poor secretion management as well.  Patient had a wet vocal quality secondary to laryngeal secretions,  and difficulty speaking due to decreased breath support.  Patient coughed after one small sip of water, and was unable to move a bite of scrambled egg orally.  Patient stated she had swallowed the egg, but in remained posteriorly on the left side of her tongue (removed by SLP) .  Patient is not currently safe for po's.  Left labial and lingual weakness noted, with tongue deviating to the left upon protrusion.  ? baseline; h/o CVA, however, no reported dysphagia PTA.    Aspiration Risk  Severe    Diet Recommendation  NPO;Alternative means - temporary   Medication Administration: Via alternative means    Other  Recommendations Recommended Consults: MBS (once respiratory staus improves.)   Follow Up Recommendations       Frequency and Duration        Pertinent Vitals/Pain C/O chest pain "10"-  RN aware and gave meds;  LS Rhonchi; low grade fever, CXR reports NAD; MRI negative for acute CVA    SLP Swallow Goals     Swallow Study Prior Functional Status       General HPI: From [name] long term [facility].  She was brought in after being found confused and less responsive.  For the last several weeks, patient has been getting weaker per daughter- unable to use walker to get to the bathroom.  She does endorse SOB but no fever/chills, no trouble eating, no dysuria but is very hard of hearing.  In the ER, she was found to have a PNA and was in a fib with RVR- family denies history.  She was also hypotensive but BP responded to IVF.  Family confirms she is a DNR.  In the ER she was given abx for [facility] and cardizem gtt for atrial fibrillation. Type of Study: Bedside swallow evaluation Previous Swallow Assessment: none found Diet Prior to this Study: Regular;Thin liquids Temperature Spikes Noted: Yes (low grade) Respiratory Status: Nasal cannula History of Recent Intubation: No Behavior/Cognition: Alert;Cooperative;Hard of hearing (hears best on left.)  Oral Cavity - Dentition: Edentulous (dentures at home) Self-Feeding Abilities: Needs assist Patient Positioning: Upright in bed Baseline Vocal Quality: Wet;Low vocal intensity Volitional Cough: Weak Volitional Swallow: Unable to elicit    Oral/Motor/Sensory Function Overall Oral Motor/Sensory Function: Impaired Labial ROM: Reduced left Labial Symmetry: Abnormal symmetry left Labial Strength: Reduced Lingual ROM: Reduced left Lingual Symmetry: Abnormal symmetry left Lingual Strength: Reduced Facial ROM: Reduced left Facial Symmetry: Left  droop Facial Strength: Reduced Velum: Within Functional Limits Mandible: Within Functional Limits   Ice Chips Ice chips: Impaired Presentation: Spoon Oral Phase Impairments: Impaired anterior to posterior transit;Reduced lingual movement/coordination Oral Phase Functional Implications: Prolonged oral transit Pharyngeal Phase Impairments: Suspected delayed Swallow;Throat Clearing - Delayed   Thin Liquid Thin Liquid: Impaired Presentation: Spoon Oral Phase Impairments: Reduced lingual movement/coordination;Impaired anterior to posterior transit Pharyngeal  Phase Impairments: Suspected delayed Swallow;Cough - Immediate    Nectar Thick Nectar Thick Liquid: Not tested   Honey Thick Honey Thick Liquid: Not tested   Puree     Solid   GO    Solid: Impaired Presentation: Spoon Oral Phase Impairments: Reduced lingual movement/coordination Oral Phase Functional Implications: Oral residue;Left lateral sulci pocketing Pharyngeal Phase Impairments: Suspected delayed Swallow;Decreased hyoid-laryngeal movement;Cough - Immediate;Wet Vocal Quality       Maryjo RochesterWillis, Xaidyn Kepner T 01/06/2014,10:39 AM

## 2014-01-06 NOTE — Progress Notes (Signed)
While lying flatter for 2D Echo this am, Pt c/o CP. [employer] card gtt to 10, suctioned, took BP, paged DR. Will continue to monitor.

## 2014-01-07 DIAGNOSIS — I5033 Acute on chronic diastolic (congestive) heart failure: Secondary | ICD-10-CM

## 2014-01-07 LAB — STREP PNEUMONIAE URINARY ANTIGEN: STREP PNEUMO URINARY ANTIGEN: NEGATIVE

## 2014-01-07 LAB — BASIC METABOLIC PANEL
BUN: 12 mg/dL (ref 6–23)
CO2: 31 meq/L (ref 19–32)
CREATININE: 0.82 mg/dL (ref 0.50–1.10)
Calcium: 8.4 mg/dL (ref 8.4–10.5)
Chloride: 93 mEq/L — ABNORMAL LOW (ref 96–112)
GFR calc Af Amer: 70 mL/min — ABNORMAL LOW (ref >90)
GFR calc non Af Amer: 60 mL/min — ABNORMAL LOW (ref >90)
Glucose, Bld: 130 mg/dL — ABNORMAL HIGH (ref 70–99)
Potassium: 3.5 mEq/L — ABNORMAL LOW (ref 3.7–5.3)
Sodium: 142 mEq/L (ref 137–147)

## 2014-01-07 LAB — GLUCOSE, CAPILLARY
Glucose-Capillary: 119 mg/dL — ABNORMAL HIGH (ref 70–99)
Glucose-Capillary: 121 mg/dL — ABNORMAL HIGH (ref 70–99)
Glucose-Capillary: 134 mg/dL — ABNORMAL HIGH (ref 70–99)
Glucose-Capillary: 114 mg/dL — ABNORMAL HIGH (ref 70–99)

## 2014-01-07 LAB — VANCOMYCIN, TROUGH: Vancomycin Tr: 18.8 ug/mL (ref 10.0–20.0)

## 2014-01-07 MED ORDER — POTASSIUM CHLORIDE CRYS ER 20 MEQ PO TBCR
40.0000 meq | EXTENDED_RELEASE_TABLET | ORAL | Status: AC
  Administered 2014-01-07: 40 meq via ORAL
  Filled 2014-01-07: qty 2

## 2014-01-07 MED ORDER — POTASSIUM CHLORIDE CRYS ER 20 MEQ PO TBCR: 40.0000 meq | EXTENDED_RELEASE_TABLET | ORAL | Status: DC | PRN

## 2014-01-07 NOTE — Progress Notes (Signed)
[facility] Progress Note [place] TEAM 1 - Stepdown/ICU TEAM   Savannah KochMaie R [name] QMV:[id number] DOB: 05/08/1919 DOA: 01/05/2014 PCP: Oneal GroutPANDEY, MAHIMA, MD  Brief narrative: Savannah [name] is a 80 y.o. female presenting on 01/05/2014 with  has a past medical history of Hypertension; Hypercholesteremia; Pelvis fracture; Kidney stones; Diabetes mellitus without complication; CVA (cerebral infarction) who presents from [name] long term [facility]. She was brought in after being found confused and less responsive. For the last several weeks, patient has been getting weaker per daughter- unable to use walker to get to the bathroom. She does endorse SOB and cough but no fever/chills, no trouble eating, no dysuria. Very hard of hearing.  She was diagnosed with PNA in the ER and noted to have A-fib with RVR and admitted to SDU for Cardizem infusion.   Subjective: Very sleepy today- no complaints  Assessment/Plan: Principal Problem:   Acute respiratory failure - likely due to CHF from uncontrolled HR and diastolic dysf  - will cont antibiotics however for possible HCAP- will cont the SLP eval - stop IVF and start aggressive diuresis  Active Problems:   Atrial fibrillation - newly diagnosed and now converted back to NSR - ECHO completed- see below - not a candidate for anticoagulation as often feels like she may fall (per granddaughter) - will cont ASA 81mg  - cont Metoprolol for this via IV- d/c Cardizem infusion    Diastolic CHF, acute on chronic - ECHO reveals LVH- diuresis as above  HTN - cont Metoprolol but via IV   Type II or unspecified type diabetes mellitus without mention of complication, uncontrolled - not on medication as outpt  Dementia  Nutrition - NPO for now until passes swallow eval   Code Status: DNR Family Communication: Granddaughter at bedside on 2/27 Disposition Plan: follow in SDU   Consultants: none  Procedures: none  Antibiotics: Antibiotics Given (last  72 hours)   Date/Time Action Medication Dose Rate   01/06/14 0015 Given   ceFEPIme (MAXIPIME) 1 g in dextrose 5 % 50 mL IVPB 1 g 100 mL/hr   01/06/14 0139 Given   vancomycin (VANCOCIN) IVPB 750 mg/150 ml premix 750 mg 150 mL/hr   01/06/14 1033 Given   ceFEPIme (MAXIPIME) 1 g in dextrose 5 % 50 mL IVPB 1 g 100 mL/hr   01/06/14 1300 Given   vancomycin (VANCOCIN) IVPB 750 mg/150 ml premix 750 mg 150 mL/hr   01/06/14 2122 Given   ceFEPIme (MAXIPIME) 1 g in dextrose 5 % 50 mL IVPB 1 g 100 mL/hr   01/07/14 0016 Given   vancomycin (VANCOCIN) IVPB 750 mg/150 ml premix 750 mg 150 mL/hr   01/07/14 0932 Given   ceFEPIme (MAXIPIME) 1 g in dextrose 5 % 50 mL IVPB 1 g 100 mL/hr       DVT prophylaxis: Lovenox  Objective: Filed Weights   01/05/14 1347  Weight: 82.7 kg (182 lb 5.1 oz)   Blood pressure 124/75, pulse 69, temperature 97.7 F (36.5 C), temperature source Oral, resp. rate 33, height 5\' 5"  (1.651 m), weight 82.7 kg (182 lb 5.1 oz), SpO2 97.00%.  Intake/Output Summary (Last 24 hours) at 01/07/14 1047 Last data filed at 01/07/14 0700  Gross per 24 hour  Intake    450 ml  Output    300 ml  Net    150 ml     Exam: General: sleepy but awakable- oreinted x 3, No acute respiratory distress Lungs: Clear to auscultation bilaterally without wheezes or crackles-  very poor air entry- congested cough- on 4 L O2- 97% pulse ox Cardiovascular: Regular rate and rhythm without murmur gallop or rub normal S1 and S2 Abdomen: Nontender, nondistended, soft, bowel sounds positive, no rebound, no ascites, no appreciable mass Extremities: No significant cyanosis, clubbing, or edema bilateral lower extremities  Data Reviewed: Basic Metabolic Panel:  Recent Labs Lab 01/05/14 0936 01/06/14 0630  NA 140 142  K 4.5 4.5  CL 98 103  CO2 28 29  GLUCOSE 134* 120*  BUN 13 11  CREATININE 0.69 0.66  CALCIUM 8.6 8.4   Liver Function Tests:  Recent Labs Lab 01/05/14 0936  AST 19  ALT 11   ALKPHOS 63  BILITOT 0.5  PROT 6.6  ALBUMIN 2.5*   No results found for this basename: LIPASE, AMYLASE,  in the last 168 hours No results found for this basename: AMMONIA,  in the last 168 hours CBC:  Recent Labs Lab 01/05/14 0936  WBC 10.2  NEUTROABS 7.3  HGB 12.4  HCT 39.8  MCV 80.4  PLT 233   Cardiac Enzymes: No results found for this basename: CKTOTAL, CKMB, CKMBINDEX, TROPONINI,  in the last 168 hours BNP (last 3 results)  Recent Labs  08/23/13 2224  PROBNP 184.6   CBG:  Recent Labs Lab 01/05/14 2142 01/06/14 0800 01/06/14 1155 01/06/14 2117 01/07/14 0754  GLUCAP 109* 107* 118* 108* 119*    Recent Results (from the past 240 hour(s))  URINE CULTURE     Status: None   Collection Time    01/05/14  9:43 AM      Result Value Ref Range Status   Specimen Description URINE, RANDOM   Final   Special Requests NONE   Final   Culture  Setup Time     Final   Value: 01/05/2014 14:52     Performed at [facility] Count     Final   Value: NO GROWTH     Performed at [facility]   Culture     Final   Value: NO GROWTH     Performed at [facility]   Report Status 01/06/2014 FINAL   Final  CULTURE, BLOOD (ROUTINE X 2)     Status: None   Collection Time    01/05/14 10:14 AM      Result Value Ref Range Status   Specimen Description BLOOD RIGHT ANTECUBITAL   Final   Special Requests BOTTLES DRAWN AEROBIC AND ANAEROBIC 10CCS   Final   Culture  Setup Time     Final   Value: 01/05/2014 14:37     Performed at [facility]   Culture     Final   Value:        BLOOD CULTURE RECEIVED NO GROWTH TO DATE CULTURE WILL BE HELD FOR 5 DAYS BEFORE ISSUING A FINAL NEGATIVE REPORT     Performed at [facility]   Report Status PENDING   Incomplete  CULTURE, BLOOD (ROUTINE X 2)     Status: None   Collection Time    01/05/14 10:26 AM      Result Value Ref Range Status   Specimen Description BLOOD LEFT ANTECUBITAL   Final   Special  Requests BOTTLES DRAWN AEROBIC AND ANAEROBIC 5CCS   Final   Culture  Setup Time     Final   Value: 01/05/2014 14:37     Performed at [facility]   Culture     Final  Value:        BLOOD CULTURE RECEIVED NO GROWTH TO DATE CULTURE WILL BE HELD FOR 5 DAYS BEFORE ISSUING A FINAL NEGATIVE REPORT     Performed at [facility]   Report Status PENDING   Incomplete  MRSA PCR SCREENING     Status: Abnormal   Collection Time    01/05/14  1:47 PM      Result Value Ref Range Status   MRSA by PCR POSITIVE (*) NEGATIVE Final   Comment:            The GeneXpert MRSA Assay (FDA     approved for NASAL specimens     only), is one component of a     comprehensive MRSA colonization     surveillance program. It is not     intended to diagnose MRSA     infection nor to guide or     monitor treatment for     MRSA infections.     RESULT CALLED TO, READ BACK BY AND VERIFIED WITH:     [facility] RN 16:40 01/05/14 ([email or web address])     Studies:  Recent x-ray studies have been reviewed in detail by the Attending Physician  Scheduled Meds:  Scheduled Meds: . antiseptic oral rinse  15 mL Mouth Rinse BID  . aspirin EC  81 mg Oral Daily  . ceFEPime (MAXIPIME) IV  1 g Intravenous Q12H  . enoxaparin (LOVENOX) injection  40 mg Subcutaneous Q24H  . furosemide  40 mg Intravenous 3 times per day  . insulin aspart  0-5 Units Subcutaneous QHS  . insulin aspart  0-9 Units Subcutaneous TID WC  . metoprolol  5 mg Intravenous 4 times per day  . pantoprazole (PROTONIX) IV  40 mg Intravenous Q12H  . sodium chloride  3 mL Intravenous Q12H  . vancomycin  750 mg Intravenous Q12H   Continuous Infusions:    Time spent on care of this patient: > 52m in   [facility], MD  [facility] Office  [phone] Pager - Text Page per Loretha Stapler as per below:  On-Call/Text Page:      [email or web address]      password [email or web address]  If 7PM-7AM, please contact night-coverage [email or web address] Password [facility] 01/07/2014, 10:47 AM    LOS: 2 days

## 2014-01-07 NOTE — Progress Notes (Signed)
ANTIBIOTIC CONSULT NOTE - FOLLOW UP  Pharmacy Consult for vancomycin, cefepime Indication: rule out sepsis  No Known Allergies Patient Measurements: Height: 5\' 5"  (165.1 cm) Weight: 182 lb 5.1 oz (82.7 kg) IBW/kg (Calculated) : 57 Vital Signs: Temp: 100.2 F (37.9 C) (02/28 1203) Temp src: Axillary (02/28 1203) BP: 146/62 mmHg (02/28 1155) Pulse Rate: 99 (02/28 1155) Intake/Output from previous day: 02/27 0701 - 02/28 0700 In: 500 [I.V.:100; IV Piggyback:400] Out: 300 [Urine:300] Intake/Output from this shift: Total I/O In: 15 [I.V.:15] Out: 150 [Urine:150] Labs:  Recent Labs  01/05/14 0936 01/06/14 0630 01/07/14 1110  WBC 10.2  --   --   HGB 12.4  --   --   PLT 233  --   --   CREATININE 0.69 0.66 0.82   Estimated Creatinine Clearance: 46.5 ml/min (by C-G formula based on Cr of 0.82).  Recent Labs  01/07/14 1110  VANCOTROUGH 18.8     Microbiology: Recent Results (from the past 720 hour(s))  URINE CULTURE     Status: None   Collection Time    01/05/14  9:43 AM      Result Value Ref Range Status   Specimen Description URINE, RANDOM   Final   Special Requests NONE   Final   Culture  Setup Time     Final   Value: 01/05/2014 14:52     [facility]formed [facility]   [place] Count     Final   Value: NO GROWTH     [facility]formed at [facility]   Culture     Final   Value: NO GROWTH     [facility]formed at [facility]   Report Status 01/06/2014 FINAL   Final  CULTURE, BLOOD (ROUTINE X 2)     Status: None   Collection Time    01/05/14 10:14 AM      Result Value Ref Range Status   Specimen Description BLOOD RIGHT ANTECUBITAL   Final   Special Requests BOTTLES DRAWN AEROBIC AND ANAEROBIC 10CCS   Final   Culture  Setup Time     Final   Value: 01/05/2014 14:37     [facility]formed at [facility]   Culture     Final   Value:        BLOOD CULTURE RECEIVED NO GROWTH TO DATE CULTURE WILL BE HELD FOR 5 DAYS BEFORE ISSUING A FINAL NEGATIVE REPORT   [facility]formed at [facility]   Report Status PENDING   Incomplete  CULTURE, BLOOD (ROUTINE X 2)     Status: None   Collection Time    01/05/14 10:26 AM      Result Value Ref Range Status   Specimen Description BLOOD LEFT ANTECUBITAL   Final   Special Requests BOTTLES DRAWN AEROBIC AND ANAEROBIC 5CCS   Final   Culture  Setup Time     Final   Value: 01/05/2014 14:37     Performed at [facility]   Culture     Final   Value:        BLOOD CULTURE RECEIVED NO GROWTH TO DATE CULTURE WILL BE HELD FOR 5 DAYS BEFORE ISSUING A FINAL NEGATIVE REPORT     [facility]formed at [facility]   Report Status PENDING   Incomplete  MRSA PCR SCREENING     Status: Abnormal   Collection Time    01/05/14  1:47 PM      Result Value Ref Range Status   MRSA by PCR  POSITIVE (*) NEGATIVE Final   Comment:            The GeneXpert MRSA Assay (FDA     approved for NASAL specimens     only), is one component of a     comprehensive MRSA colonization     surveillance program. It is not     intended to diagnose MRSA     infection nor to guide or     monitor treatment for     MRSA infections.     RESULT CALLED TO, READ BACK BY AND VERIFIED WITH:     [facility] RN 16:40 01/05/14 ([email or web address])    Anti-infectives   Start     Dose/Rate Route Frequency Ordered Stop   01/06/14 0000  vancomycin (VANCOCIN) IVPB 750 mg/150 ml premix     750 mg 150 mL/hr over 60 Minutes Intravenous Every 12 hours 01/05/14 1149     01/05/14 2200  ceFEPIme (MAXIPIME) 1 g in dextrose 5 % 50 mL IVPB     1 g 100 mL/hr over 30 Minutes Intravenous Every 12 hours 01/05/14 0949     01/05/14 1015  ceFEPIme (MAXIPIME) 1 g in dextrose 5 % 50 mL IVPB     1 g 100 mL/hr over 30 Minutes Intravenous STAT 01/05/14 1012 01/05/14 1131   01/05/14 1015  vancomycin (VANCOCIN) 1,500 mg in sodium chloride 0.9 % 500 mL IVPB     1,500 mg 250 mL/hr over 120 Minutes Intravenous  Once 01/05/14 1012 01/05/14 1340   01/05/14 1000  vancomycin (VANCOCIN) 2,000  mg in sodium chloride 0.9 % 500 mL IVPB  Status:  Discontinued     2,000 mg 250 mL/hr over 120 Minutes Intravenous  Once 01/05/14 0949 01/05/14 1012      Assessment: 89 YOF on day #3 of vancomycin and cefepime for r/o sepsis PNA. WBC wnl. Tmax 100.2. Cultures no growth to date. Vancomycin trough is therapeutic at 18.8 on current dose of 750mg  IV q12h.   Goal of Therapy:  Vancomycin trough level 15-20 mcg/ml  Plan:  Continue vancomycin 750mg  IV q12h Continue cefepime 1g IV q12h.  Follow-up duration of therapy.  Follow-up renal function, cultures, and clinical status  Link Snuffer, PharmD, BCPS Clinical Pharmacist [phone] 01/07/2014,1:00 PM

## 2014-01-07 NOTE — Progress Notes (Signed)
Speech Language Pathology Treatment: Dysphagia  Patient Details Name: Savannah [name] MRN: [id number] DOB: 03/17/1919 Today's Date: 01/07/2014 Time: [id number] SLP Time Calculation (min): 24 min  Assessment / Plan / Recommendation Clinical Impression  F/u after 2/27 clinical swallow eval.  Pt arousable.  Oral care provided - thick, leathery secretions noted coating palate and posterior tongue - removed manually and oral suctioning provided.  RN attempted placement of dentures - did not fit adequately, so removed.   Pt continues to present with symptoms of dysphagia with aspiration risk: reduced oral control; congested cough, wet phonation, and wheezing noted after consumption of limited thin liquids.  Purees elicit multiple swallows per bolus.  Suspect continued difficulty coordinating swallowing and respiration, compounded by fluctuating mentation.    Recommend continuing NPO for now excluding meds, which should be crushed and administered in puree.  SLP will continue to follow for PO readiness.    HPI HPI: [name].  She was brought in after being found confused and less responsive.  For the last several weeks, patient has been getting weaker per daughter- unable to use walker to get to the bathroom.  She does endorse SOB but no fever/chills, no trouble eating, no dysuria but is very hard of hearing.  In the ER, she was found to have a PNA and was in a fib with RVR- family denies history.  She was also hypotensive but BP responded to IVF.  Family confirms she is a DNR.  In the ER [facility] and cardizem gtt for atrial fibrillation.      SLP Plan  Continue with current plan of care    Recommendations Diet recommendations: NPO Medication Administration: Crushed with puree              Oral Care Recommendations: Oral care Q4 per protocol Follow up Recommendations: Skilled Nursing facility Plan: Continue with current plan of care   Shermaine Rivet L. Samson Fredericouture, [place]  CCC/SLP Pager [phone]      Blenda MountsCouture, Britney Captain Laurice 01/07/2014, 2:08 PM

## 2014-01-08 LAB — GLUCOSE, CAPILLARY
GLUCOSE-CAPILLARY: 110 mg/dL — AB (ref 70–99)
GLUCOSE-CAPILLARY: 118 mg/dL — AB (ref 70–99)
Glucose-Capillary: 119 mg/dL — ABNORMAL HIGH (ref 70–99)
Glucose-Capillary: 141 mg/dL — ABNORMAL HIGH (ref 70–99)

## 2014-01-08 LAB — BASIC METABOLIC PANEL
BUN: 17 mg/dL (ref 6–23)
CO2: 34 mEq/L — ABNORMAL HIGH (ref 19–32)
Calcium: 8.7 mg/dL (ref 8.4–10.5)
Chloride: 96 mEq/L (ref 96–112)
Creatinine, Ser: 0.8 mg/dL (ref 0.50–1.10)
GFR, EST AFRICAN AMERICAN: 72 mL/min — AB (ref >90)
GFR, EST NON AFRICAN AMERICAN: 62 mL/min — AB (ref >90)
Glucose, Bld: 117 mg/dL — ABNORMAL HIGH (ref 70–99)
POTASSIUM: 3.7 meq/L (ref 3.7–5.3)
SODIUM: 145 meq/L (ref 137–147)

## 2014-01-08 MED ORDER — METOPROLOL TARTRATE 25 MG PO TABS
25.0000 mg | ORAL_TABLET | Freq: Two times a day (BID) | ORAL | Status: DC
  Administered 2014-01-09 – 2014-01-16 (×15): 25 mg via ORAL
  Filled 2014-01-08 (×17): qty 1

## 2014-01-08 MED ORDER — MUPIROCIN 2 % EX OINT
1.0000 "application " | TOPICAL_OINTMENT | Freq: Two times a day (BID) | CUTANEOUS | Status: AC
  Administered 2014-01-08 – 2014-01-12 (×9): 1 via NASAL
  Filled 2014-01-08 (×2): qty 22

## 2014-01-08 MED ORDER — CHLORHEXIDINE GLUCONATE CLOTH 2 % EX PADS
6.0000 | MEDICATED_PAD | Freq: Every day | CUTANEOUS | Status: AC
  Administered 2014-01-09 – 2014-01-13 (×5): 6 via TOPICAL

## 2014-01-08 NOTE — Progress Notes (Signed)
Clinical Social Work Department BRIEF PSYCHOSOCIAL ASSESSMENT 01/08/2014  Patient:  Savannah [name],Savannah [name]     Account Number:  [id number]     Admit date:  01/05/2014  Clinical Social Worker:  Harless NakayamaAMBELAL,Aliece Honold, LCSWA  Date/Time:  01/08/2014 02:00 PM  Referred by:  Physician  Date Referred:  01/08/2014 Referred for  SNF Placement   Other Referral:   Interview type:  Family Other interview type:   Pt daughter at bedside    PSYCHOSOCIAL DATA Living Status:  FACILITY Admitted from facility:  [facility] Level of care:  Skilled Nursing Facility Primary support name:  Haroldine LawsLinda [name] [phone] Primary support relationship to patient:  CHILD, ADULT Degree of support available:   Pt has supportive family    CURRENT CONCERNS Current Concerns  Post-Acute Placement   Other Concerns:    SOCIAL WORK ASSESSMENT / PLAN CSW informed pt was admitted from facility. CSW visited pt room and pt daughter at bedside. Pt not oriented and did not participate in conversation. Pt daughter informed CSW that pt is long term [facility] at [facility]. Pt has been at facility for almost a year now and pt and pt family are reportedly pleased with the facility. Pt daughter said plan will be for pt to return when medically stable and she has no concerns at this time.   Assessment/plan status:  Psychosocial Support/Ongoing Assessment of Needs Other assessment/ plan:   Information/referral to community resources:   None needed    PATIENT'S/FAMILY'S RESPONSE TO PLAN OF CARE: Pt family [facility]able [facility]       [place] Tacha Manni, LCSWA [phone]

## 2014-01-08 NOTE — Progress Notes (Signed)
[facility] Progress Note [place] TEAM 1 - Stepdown/ICU TEAM   Savannah [name] ZOX:[id number] DOB: 1919-01-22 DOA: 01/05/2014 PCP: Oneal Grout, MD  Brief narrative: Savannah [name] is a 80 y.o. female presenting on 01/05/2014 with  has a past medical history of Hypertension; Hypercholesteremia; Pelvis fracture; Kidney stones; Diabetes mellitus without complication; CVA (cerebral infarction) who presents from [name] long term [facility]. She was brought in after being found confused and less responsive. For the last several weeks, patient has been getting weaker per daughter- unable to use walker to get to the bathroom. She does endorse SOB and cough but no fever/chills, no trouble eating, no dysuria. Very hard of hearing.  She was diagnosed with PNA in the ER and noted to have A-fib with RVR and admitted to SDU for Cardizem infusion.   Subjective: Alert- no complaints  Assessment/Plan: Principal Problem:   Acute respiratory failure - likely due to CHF from uncontrolled HR and diastolic dysf  - will cont antibiotics however for possible HCAP- will cont the SLP eval - stopped IVF and started aggressive diuresis- appears to be euvolemic now- Bicarb rising  Active Problems:   PAF - newly diagnosed and now converted back to NSR - ECHO completed- see below - not a candidate for anticoagulation as often feels like she may fall (per granddaughter) - will cont ASA 81mg  - cont Metoprolol for this via IV- d/c Cardizem infusion    Diastolic CHF, acute on chronic - ECHO reveals LVH- diuresed as above- will stop Lasix now and follow - note that she is not on IVF while being NPO  HTN - cont Metoprolol - can switch to oral now   Type II or unspecified type diabetes mellitus without mention of complication, uncontrolled - not on medication as outpt  Dementia  Nutrition - NPO for now except meds in applesauce until passes swallow eval   Code Status: DNR Family Communication: Granddaughter  at bedside on 2/27 Disposition Plan: tx to tele - PT eval  Consultants: none  Procedures: none  Antibiotics: Antibiotics Given (last 72 hours)   Date/Time Action Medication Dose Rate   01/06/14 0015 Given   ceFEPIme (MAXIPIME) 1 g in dextrose 5 % 50 mL IVPB 1 g 100 mL/hr   01/06/14 0139 Given   vancomycin (VANCOCIN) IVPB 750 mg/150 ml premix 750 mg 150 mL/hr   01/06/14 1033 Given   ceFEPIme (MAXIPIME) 1 g in dextrose 5 % 50 mL IVPB 1 g 100 mL/hr   01/06/14 1300 Given   vancomycin (VANCOCIN) IVPB 750 mg/150 ml premix 750 mg 150 mL/hr   01/06/14 2122 Given   ceFEPIme (MAXIPIME) 1 g in dextrose 5 % 50 mL IVPB 1 g 100 mL/hr   01/07/14 0016 Given   vancomycin (VANCOCIN) IVPB 750 mg/150 ml premix 750 mg 150 mL/hr   01/07/14 0932 Given   ceFEPIme (MAXIPIME) 1 g in dextrose 5 % 50 mL IVPB 1 g 100 mL/hr   01/07/14 1346 Given  [awaiting vanc trough]   vancomycin (VANCOCIN) IVPB 750 mg/150 ml premix 750 mg 150 mL/hr   01/07/14 2202 Given   ceFEPIme (MAXIPIME) 1 g in dextrose 5 % 50 mL IVPB 1 g 100 mL/hr   01/07/14 2327 Given   vancomycin (VANCOCIN) IVPB 750 mg/150 ml premix 750 mg 150 mL/hr   01/08/14 0914 Given   ceFEPIme (MAXIPIME) 1 g in dextrose 5 % 50 mL IVPB 1 g 100 mL/hr   01/08/14 1313 Given   vancomycin (VANCOCIN)  IVPB 750 mg/150 ml premix 750 mg 150 mL/hr       DVT prophylaxis: Lovenox  Objective: Filed Weights   01/05/14 1347  Weight: 82.7 kg (182 lb 5.1 oz)   Blood pressure 142/55, pulse 81, temperature 98.6 F (37 C), temperature source Axillary, resp. rate 21, height 5\' 5"  (1.651 m), weight 82.7 kg (182 lb 5.1 oz), SpO2 88.00%.  Intake/Output Summary (Last 24 hours) at 01/08/14 1355 Last data filed at 01/08/14 1200  Gross per 24 hour  Intake    400 ml  Output   1840 ml  Net  -1440 ml     Exam: General: awake- oreinted x 3, No acute respiratory distress Lungs: Clear to auscultation bilaterally without wheezes or crackles- very poor air entry- congested  cough- on 4 L O2- 97% pulse ox Cardiovascular: Regular rate and rhythm without murmur gallop or rub normal S1 and S2 Abdomen: Nontender, nondistended, soft, bowel sounds positive, no rebound, no ascites, no appreciable mass Extremities: No significant cyanosis, clubbing, or edema bilateral lower extremities  Data Reviewed: Basic Metabolic Panel:  Recent Labs Lab 01/05/14 0936 01/06/14 0630 01/07/14 1110 01/08/14 0900  NA 140 142 142 145  K 4.5 4.5 3.5* 3.7  CL 98 103 93* 96  CO2 28 29 31  34*  GLUCOSE 134* 120* 130* 117*  BUN 13 11 12 17   CREATININE 0.69 0.66 0.82 0.80  CALCIUM 8.6 8.4 8.4 8.7   Liver Function Tests:  Recent Labs Lab 01/05/14 0936  AST 19  ALT 11  ALKPHOS 63  BILITOT 0.5  PROT 6.6  ALBUMIN 2.5*   No results found for this basename: LIPASE, AMYLASE,  in the last 168 hours No results found for this basename: AMMONIA,  in the last 168 hours CBC:  Recent Labs Lab 01/05/14 0936  WBC 10.2  NEUTROABS 7.3  HGB 12.4  HCT 39.8  MCV 80.4  PLT 233   Cardiac Enzymes: No results found for this basename: CKTOTAL, CKMB, CKMBINDEX, TROPONINI,  in the last 168 hours BNP (last 3 results)  Recent Labs  08/23/13 2224  PROBNP 184.6   CBG:  Recent Labs Lab 01/07/14 1153 01/07/14 1647 01/07/14 2106 01/08/14 0821 01/08/14 1148  GLUCAP 121* 134* 114* 119* 141*    Recent Results (from the past 240 hour(s))  URINE CULTURE     Status: None   Collection Time    01/05/14  9:43 AM      Result Value Ref Range Status   Specimen Description URINE, RANDOM   Final   Special Requests NONE   Final   Culture  Setup Time     Final   Value: 01/05/2014 14:52     Performed at [facility] Count     Final   Value: NO GROWTH     Performed at [facility]   Culture     Final   Value: NO GROWTH     Performed at [facility]   Report Status 01/06/2014 FINAL   Final  CULTURE, BLOOD (ROUTINE X 2)     Status: None   Collection Time     01/05/14 10:14 AM      Result Value Ref Range Status   Specimen Description BLOOD RIGHT ANTECUBITAL   Final   Special Requests BOTTLES DRAWN AEROBIC AND ANAEROBIC 10CCS   Final   Culture  Setup Time     Final   Value: 01/05/2014 14:37     Performed at [facility]   Culture     Final   Value:        BLOOD CULTURE RECEIVED NO GROWTH TO DATE CULTURE WILL BE HELD FOR 5 DAYS BEFORE ISSUING A FINAL NEGATIVE REPORT     Performed at [facility]   Report Status PENDING   Incomplete  CULTURE, BLOOD (ROUTINE X 2)     Status: None   Collection Time    01/05/14 10:26 AM      Result Value Ref Range Status   Specimen Description BLOOD LEFT ANTECUBITAL   Final   Special Requests BOTTLES DRAWN AEROBIC AND ANAEROBIC 5CCS   Final   Culture  Setup Time     Final   Value: 01/05/2014 14:37     Performed at [facility]   Culture     Final   Value:        BLOOD CULTURE RECEIVED NO GROWTH TO DATE CULTURE WILL BE HELD FOR 5 DAYS BEFORE ISSUING A FINAL NEGATIVE REPORT     Performed at [facility]   Report Status PENDING   Incomplete  MRSA PCR SCREENING     Status: Abnormal   Collection Time    01/05/14  1:47 PM      Result Value Ref Range Status   MRSA by PCR POSITIVE (*) NEGATIVE Final   Comment:            The GeneXpert MRSA Assay (FDA     approved for NASAL specimens     only), is one component of a     comprehensive MRSA colonization     surveillance program. It is not     intended to diagnose MRSA     infection nor to guide or     monitor treatment for     MRSA infections.     RESULT CALLED TO, READ BACK BY AND VERIFIED WITH:     [facility] RN 16:40 01/05/14 ([email or web address])     Studies:  Recent x-ray studies have been reviewed in detail by the Attending Physician  Scheduled Meds:  Scheduled Meds: . antiseptic oral rinse  15 mL Mouth Rinse BID  . aspirin EC  81 mg Oral Daily  . ceFEPime (MAXIPIME) IV  1 g Intravenous Q12H  . enoxaparin (LOVENOX) injection   40 mg Subcutaneous Q24H  . furosemide  40 mg Intravenous 3 times per day  . insulin aspart  0-5 Units Subcutaneous QHS  . insulin aspart  0-9 Units Subcutaneous TID WC  . metoprolol  5 mg Intravenous 4 times per day  . pantoprazole (PROTONIX) IV  40 mg Intravenous Q12H  . sodium chloride  3 mL Intravenous Q12H  . vancomycin  750 mg Intravenous Q12H   Continuous Infusions:    Time spent on care of this patient: 25 min   Calvert CantorIZWAN,Camari Wisham, MD  [facility] Office  [phone] Pager - Text Page per Loretha StaplerAmion as per below:  On-Call/Text Page:      [email or web address]      password [email or web address]  If 7PM-7AM, please contact night-coverage [email or web address] Password [email or web address] 01/08/2014, 1:55 PM   LOS: 3 days

## 2014-01-08 NOTE — Progress Notes (Addendum)
Report called to [place], RN, 5 west.  All questions answered.. Patient without complaints.  Daughter, Harriett Sineancy, notified of transfer and patient's room number.

## 2014-01-09 ENCOUNTER — Inpatient Hospital Stay (HOSPITAL_COMMUNITY): Payer: PRIVATE HEALTH INSURANCE

## 2014-01-09 DIAGNOSIS — K5900 Constipation, unspecified: Secondary | ICD-10-CM

## 2014-01-09 DIAGNOSIS — J9610 Chronic respiratory failure, unspecified whether with hypoxia or hypercapnia: Secondary | ICD-10-CM

## 2014-01-09 LAB — LEGIONELLA ANTIGEN, URINE: LEGIONELLA ANTIGEN, URINE: NEGATIVE

## 2014-01-09 LAB — GLUCOSE, CAPILLARY
Glucose-Capillary: 120 mg/dL — ABNORMAL HIGH (ref 70–99)
Glucose-Capillary: 152 mg/dL — ABNORMAL HIGH (ref 70–99)
Glucose-Capillary: 202 mg/dL — ABNORMAL HIGH (ref 70–99)
Glucose-Capillary: 128 mg/dL — ABNORMAL HIGH (ref 70–99)

## 2014-01-09 MED ORDER — STARCH (THICKENING) PO POWD
ORAL | Status: DC | PRN
  Filled 2014-01-09 (×2): qty 227

## 2014-01-09 MED ORDER — ACETAMINOPHEN 325 MG PO TABS
650.0000 mg | ORAL_TABLET | Freq: Four times a day (QID) | ORAL | Status: DC | PRN
  Administered 2014-01-09 – 2014-01-14 (×6): 650 mg via ORAL
  Filled 2014-01-09 (×7): qty 2

## 2014-01-09 MED ORDER — ENOXAPARIN SODIUM 30 MG/0.3ML ~~LOC~~ SOLN
30.0000 mg | Freq: Every day | SUBCUTANEOUS | Status: DC
  Administered 2014-01-09 – 2014-01-12 (×4): 30 mg via SUBCUTANEOUS
  Filled 2014-01-09 (×5): qty 0.3

## 2014-01-09 MED ORDER — PANTOPRAZOLE SODIUM 40 MG PO TBEC: 40.0000 mg | DELAYED_RELEASE_TABLET | Freq: Every day | ORAL | Status: DC

## 2014-01-09 MED ORDER — FUROSEMIDE 40 MG PO TABS
40.0000 mg | ORAL_TABLET | Freq: Every day | ORAL | Status: DC
  Administered 2014-01-09: 40 mg via ORAL
  Filled 2014-01-09 (×2): qty 1

## 2014-01-09 MED ORDER — PANTOPRAZOLE SODIUM 40 MG PO PACK
40.0000 mg | PACK | Freq: Every day | ORAL | Status: DC
  Filled 2014-01-09: qty 20

## 2014-01-09 MED ORDER — ENSURE PUDDING PO PUDG
1.0000 | Freq: Three times a day (TID) | ORAL | Status: DC
  Administered 2014-01-09 – 2014-01-16 (×16): 1 via ORAL
  Filled 2014-01-09 (×2): qty 1

## 2014-01-09 MED ORDER — PANTOPRAZOLE SODIUM 40 MG PO PACK
40.0000 mg | PACK | Freq: Every day | ORAL | Status: DC
  Administered 2014-01-10 – 2014-01-16 (×7): 40 mg via ORAL
  Filled 2014-01-09 (×9): qty 20

## 2014-01-09 NOTE — Procedures (Signed)
Objective Swallowing Evaluation: Modified Barium Swallowing Study  Patient Details  Name: Savannah [name] MRN: [id number] Date of Birth: 1919-05-21  Today's Date: 01/09/2014 Time: 4098-1191 SLP Time Calculation (min): 33 min  Past Medical History:  Past Medical History  Diagnosis Date  . Hypertension   . Hypercholesteremia   . Pelvis fracture   . Kidney stones   . Hyperglycemia   . Diabetes mellitus without complication   . CVA (cerebral infarction)   . Hypoxia    Past Surgical History:  Past Surgical History  Procedure Laterality Date  . Abdominal hysterectomy     HPI:  From [name] long term [facility].  She was brought in after being found confused and less responsive.  For the last several weeks, patient has been getting weaker per daughter- unable to use walker to get to the bathroom.  She does endorse SOB but no fever/chills, no trouble eating, no dysuria but is very hard of hearing.  In the ER, she was found to have a PNA and was in a fib with RVR- family denies history.  She was also hypotensive but BP responded to IVF.  Family confirms she is a DNR.  In the ER she was given abx for [facility] and cardizem gtt for atrial fibrillation.     Assessment / Plan / Recommendation Clinical Impression  Dysphagia Diagnosis: Moderate oral phase dysphagia;Moderate pharyngeal phase dysphagia;Suspected primary esophageal dysphagia Clinical impression: Pt demonstrates a moderate oral dysphagia with poor control of bolus. With small sips the pt holds bolus prior to initiating transit, with large cup sips there is sometimes significant anterior and posterior bolus loss, in some cases leading to increased aspiration. At this time, teaspoon presentation is best. Oropharyngeal phase characterized by delayed airway protection with all liquid boluses reaching the cords prior to the swallow. Nectar thick liquids are most easily sensed and ejected from vestibule (thin liquids are silently aspirated). Recommend  dys 1 (puree) and nectar thick liquids given via teaspoon with encouragement for an occasional throat clear. There is not much of a difference between honey and nectar, but if sensed aspiration events are frequently observed, downgrade to honey. Aspiration risk will continue to be moderate to high due to fluctuating mentation and overall decompensation as well as signs of a probable esophageal dysphagia as well (stasis surging up to proximal esophagus).     Treatment Recommendation  Therapy as outlined in treatment plan below    Diet Recommendation Dysphagia 1 (Puree);Nectar-thick liquid   Liquid Administration via: Spoon Medication Administration: Crushed with puree Supervision: Staff to assist with self feeding;Full supervision/cueing for compensatory strategies Compensations: Slow rate;Small sips/bites;Clear throat intermittently Postural Changes and/or Swallow Maneuvers: Seated upright 90 degrees;Upright 30-60 min after meal    Other  Recommendations Oral Care Recommendations: Oral care BID Other Recommendations: Order thickener from pharmacy   Follow Up Recommendations  Skilled Nursing facility    Frequency and Duration min 2x/week  2 weeks   Pertinent Vitals/Pain NA    SLP Swallow Goals     General HPI: From [name] long term [facility].  She was brought in after being found confused and less responsive.  For the last several weeks, patient has been getting weaker per daughter- unable to use walker to get to the bathroom.  She does endorse SOB but no fever/chills, no trouble eating, no dysuria but is very hard of hearing.  In the ER, she was found to have a PNA and was in a fib with RVR- family denies  history.  She was also hypotensive but BP responded to IVF.  Family confirms she is a DNR.  In [facility] ER [facility] and cardizem gtt for atrial fibrillation. Type of Study: Modified Barium Swallowing Study Reason for Referral: Objectively evaluate swallowing  function Previous Swallow Assessment: none found Diet Prior to this Study: NPO Temperature Spikes Noted: No Respiratory Status: Nasal cannula History of Recent Intubation: No Behavior/Cognition: Alert;Cooperative;Confused Oral Cavity - Dentition: Edentulous Oral Motor / Sensory Function: Impaired - see Bedside swallow eval Self-Feeding Abilities: Total assist Patient Positioning: Upright in chair Baseline Vocal Quality: Clear;Low vocal intensity Volitional Cough: Weak Volitional Swallow: Able to elicit Anatomy: Within functional limits Pharyngeal Secretions: Not observed secondary MBS    Reason for Referral Objectively evaluate swallowing function   Oral Phase Oral Preparation/Oral Phase Oral Phase: Impaired Oral - Honey Oral - Honey Teaspoon: Left anterior bolus loss;Weak lingual manipulation;Holding of bolus;Delayed oral transit Oral - Nectar Oral - Nectar Teaspoon: Left anterior bolus loss;Weak lingual manipulation;Holding of bolus;Delayed oral transit Oral - Nectar Cup: Left anterior bolus loss;Weak lingual manipulation;Holding of bolus;Delayed oral transit Oral - Nectar Straw: Left anterior bolus loss;Weak lingual manipulation;Holding of bolus;Delayed oral transit Oral - Thin Oral - Thin Teaspoon: Left anterior bolus loss;Weak lingual manipulation;Holding of bolus;Delayed oral transit Oral - Thin Cup: Left anterior bolus loss;Weak lingual manipulation;Holding of bolus;Delayed oral transit Oral - Solids Oral - Puree: Left anterior bolus loss;Weak lingual manipulation;Holding of bolus;Delayed oral transit Oral - Pill: Left anterior bolus loss;Weak lingual manipulation;Holding of bolus;Delayed oral transit   Pharyngeal Phase Pharyngeal Phase Pharyngeal Phase: Impaired Pharyngeal - Honey Pharyngeal - Honey Teaspoon: Delayed swallow initiation;Premature spillage to pyriform sinuses;Penetration/Aspiration before swallow;Pharyngeal residue - valleculae;Pharyngeal residue - pyriform  sinuses Penetration/Aspiration details (honey teaspoon): Material enters airway, CONTACTS cords then ejected out;Material enters airway, remains ABOVE vocal cords and not ejected out;Material enters airway, CONTACTS cords and not ejected out Pharyngeal - Nectar Pharyngeal - Nectar Teaspoon: Delayed swallow initiation;Premature spillage to pyriform sinuses;Penetration/Aspiration before swallow;Pharyngeal residue - valleculae;Pharyngeal residue - pyriform sinuses Penetration/Aspiration details (nectar teaspoon): Material enters airway, CONTACTS cords and not ejected out;Material enters airway, CONTACTS cords then ejected out;Material enters airway, remains ABOVE vocal cords and not ejected out;Material enters airway, passes BELOW cords then ejected out Pharyngeal - Nectar Cup: Delayed swallow initiation;Premature spillage to pyriform sinuses;Penetration/Aspiration before swallow;Pharyngeal residue - valleculae;Pharyngeal residue - pyriform sinuses Penetration/Aspiration details (nectar cup): Material enters airway, remains ABOVE vocal cords and not ejected out;Material enters airway, CONTACTS cords then ejected out;Material enters airway, remains ABOVE vocal cords then ejected out Pharyngeal - Nectar Straw: Delayed swallow initiation;Premature spillage to pyriform sinuses;Penetration/Aspiration before swallow;Pharyngeal residue - valleculae;Pharyngeal residue - pyriform sinuses Penetration/Aspiration details (nectar straw): Material enters airway, remains ABOVE vocal cords and not ejected out;Material enters airway, CONTACTS cords then ejected out Pharyngeal - Thin Pharyngeal - Thin Teaspoon: Delayed swallow initiation;Premature spillage to pyriform sinuses;Penetration/Aspiration before swallow;Moderate aspiration Penetration/Aspiration details (thin teaspoon): Material enters airway, passes BELOW cords without attempt by patient to eject out (silent aspiration) Pharyngeal - Thin Cup: Delayed swallow  initiation;Premature spillage to pyriform sinuses;Penetration/Aspiration before swallow;Moderate aspiration Penetration/Aspiration details (thin cup): Material enters airway, passes BELOW cords and not ejected out despite cough attempt by patient Pharyngeal - Solids Pharyngeal - Puree: Delayed swallow initiation;Premature spillage to valleculae Pharyngeal - Pill: Delayed swallow initiation;Premature spillage to valleculae  Cervical Esophageal Phase    GO    Cervical Esophageal Phase Cervical Esophageal Phase: Impaired Cervical Esophageal Phase - Comment Cervical Esophageal Comment:  trace back flow, decreased UES opening with mild pyriform residue        [facility], [place] CCC-SLP [phone]  Claudine Mouton 01/09/2014, 1:48 PM

## 2014-01-09 NOTE — Progress Notes (Signed)
[facility] Progress Note   Savannah [name] WUJ:[id number] DOB: 1919/01/24 DOA: 01/05/2014 PCP: Oneal Grout, MD  Brief narrative: Savannah MACIOLEK is a 80 y.o. female presenting on 01/05/2014 with  has a past medical history of Hypertension; Hypercholesteremia; Pelvis fracture; Kidney stones; Diabetes mellitus without complication; CVA (cerebral infarction) who presents from [name] long term [facility]. She was brought in after being found confused and less responsive. For the last several weeks, patient has been getting weaker per daughter- unable to use walker to get to the bathroom. She does endorse SOB and cough but no fever/chills, no trouble eating, no dysuria. Very hard of hearing.  She was diagnosed with PNA in the ER and noted to have A-fib with RVR and admitted to SDU for Cardizem infusion.   Subjective: No complaints  Assessment/Plan:    Acute respiratory failure - likely due to CHF from uncontrolled HR and diastolic dysf - will Stop Vanc, continue Cefepime - SLP following, for MBS today - stopped IVF and started aggressive diuresis- appears to be euvolemic now- Bicarb rising - change lasix to PO    P.Afibrillation - newly diagnosed and now converted back to NSR - ECHO completed- see below - not a candidate for anticoagulation , due to advanced age/fall risk - will cont ASA 81mg  - cont Metoprolol Po - off cardizem gtt    Diastolic CHF, acute on chronic - ECHO reveals LVH- diuresed as above- - resume PO lasix -Bmet in am    HTN - cont Metoprolol PO    DM  Type II or unspecified type diabetes mellitus without mention of complication, uncontrolled - not on medication as outpt  Dementia  Nutrition - diet pending speech eval  DVT proph: lovenox  Code Status: DNR Family Communication: Dr. Butler Denmark d/w Granddaughter at bedside on 2/27, no family at bedside now Disposition Plan: SNF soon  Consultants: none  Procedures: none  Antibiotics: Antibiotics Given  (last 72 hours)   Date/Time Action Medication Dose Rate   01/06/14 2122 Given   ceFEPIme (MAXIPIME) 1 g in dextrose 5 % 50 mL IVPB 1 g 100 mL/hr   01/07/14 0016 Given   vancomycin (VANCOCIN) IVPB 750 mg/150 ml premix 750 mg 150 mL/hr   01/07/14 0932 Given   ceFEPIme (MAXIPIME) 1 g in dextrose 5 % 50 mL IVPB 1 g 100 mL/hr   01/07/14 1346 Given  [awaiting vanc trough]   vancomycin (VANCOCIN) IVPB 750 mg/150 ml premix 750 mg 150 mL/hr   01/07/14 2202 Given   ceFEPIme (MAXIPIME) 1 g in dextrose 5 % 50 mL IVPB 1 g 100 mL/hr   01/07/14 2327 Given   vancomycin (VANCOCIN) IVPB 750 mg/150 ml premix 750 mg 150 mL/hr   01/08/14 0914 Given   ceFEPIme (MAXIPIME) 1 g in dextrose 5 % 50 mL IVPB 1 g 100 mL/hr   01/08/14 1313 Given   vancomycin (VANCOCIN) IVPB 750 mg/150 ml premix 750 mg 150 mL/hr   01/08/14 2321 Given   ceFEPIme (MAXIPIME) 1 g in dextrose 5 % 50 mL IVPB 1 g 100 mL/hr   01/09/14 0129 Given  [med not available]   vancomycin (VANCOCIN) IVPB 750 mg/150 ml premix 750 mg 150 mL/hr   01/09/14 1003 Given   ceFEPIme (MAXIPIME) 1 g in dextrose 5 % 50 mL IVPB 1 g 100 mL/hr   01/09/14 1300 Given   vancomycin (VANCOCIN) IVPB 750 mg/150 ml premix 750 mg 150 mL/hr       DVT prophylaxis: Lovenox  Objective: Filed Weights   01/05/14 1347 01/09/14 0501  Weight: 82.7 kg (182 lb 5.1 oz) 78.7 kg (173 lb 8 oz)   Blood pressure 166/82, pulse 88, temperature 97.8 F (36.6 C), temperature source Oral, resp. rate 18, height 5\' 5"  (1.651 m), weight 78.7 kg (173 lb 8 oz), SpO2 93.00%.  Intake/Output Summary (Last 24 hours) at 01/09/14 1329 Last data filed at 01/09/14 0900  Gross per 24 hour  Intake      0 ml  Output    400 ml  Net   -400 ml     Exam: General: awake- oreinted x 3, No acute respiratory distress Lungs: Clear to auscultation bilaterally without wheezes or crackles- very poor air entry- congested cough- on 4 L O2- 97% pulse ox Cardiovascular: Regular rate and rhythm without  murmur gallop or rub normal S1 and S2 Abdomen: Nontender, nondistended, soft, bowel sounds positive, no rebound, no ascites, no appreciable mass Extremities: No significant cyanosis, clubbing, or edema bilateral lower extremities  Data Reviewed: Basic Metabolic Panel:  Recent Labs Lab 01/05/14 0936 01/06/14 0630 01/07/14 1110 01/08/14 0900  NA 140 142 142 145  K 4.5 4.5 3.5* 3.7  CL 98 103 93* 96  CO2 28 29 31  34*  GLUCOSE 134* 120* 130* 117*  BUN 13 11 12 17   CREATININE 0.69 0.66 0.82 0.80  CALCIUM 8.6 8.4 8.4 8.7   Liver Function Tests:  Recent Labs Lab 01/05/14 0936  AST 19  ALT 11  ALKPHOS 63  BILITOT 0.5  PROT 6.6  ALBUMIN 2.5*   No results found for this basename: LIPASE, AMYLASE,  in the last 168 hours No results found for this basename: AMMONIA,  in the last 168 hours CBC:  Recent Labs Lab 01/05/14 0936  WBC 10.2  NEUTROABS 7.3  HGB 12.4  HCT 39.8  MCV 80.4  PLT 233   Cardiac Enzymes: No results found for this basename: CKTOTAL, CKMB, CKMBINDEX, TROPONINI,  in the last 168 hours BNP (last 3 results)  Recent Labs  08/23/13 2224  PROBNP 184.6   CBG:  Recent Labs Lab 01/08/14 0821 01/08/14 1148 01/08/14 1618 01/08/14 2122 01/09/14 0815  GLUCAP 119* 141* 110* 118* 120*    Recent Results (from the past 240 hour(s))  URINE CULTURE     Status: None   Collection Time    01/05/14  9:43 AM      Result Value Ref Range Status   Specimen Description URINE, RANDOM   Final   Special Requests NONE   Final   Culture  Setup Time     Final   Value: 01/05/2014 14:52     Performed at [facility] Count     Final   Value: NO GROWTH     Performed at [facility]   Culture     Final   Value: NO GROWTH     Performed at [facility]   Report Status 01/06/2014 FINAL   Final  CULTURE, BLOOD (ROUTINE X 2)     Status: None   Collection Time    01/05/14 10:14 AM      Result Value Ref Range Status   Specimen  Description BLOOD RIGHT ANTECUBITAL   Final   Special Requests BOTTLES DRAWN AEROBIC AND ANAEROBIC 10CCS   Final   Culture  Setup Time     Final   Value: 01/05/2014 14:37     Performed at [facility]  Final   Value:        BLOOD CULTURE RECEIVED NO GROWTH TO DATE CULTURE WILL BE HELD FOR 5 DAYS BEFORE ISSUING A FINAL NEGATIVE REPORT     [facility]   Report Status PENDING   Incomplete  CULTURE, BLOOD (ROUTINE X 2)     Status: None   Collection Time    01/05/14 10:26 AM      Result Value Ref Range Status   Specimen Description BLOOD LEFT ANTECUBITAL   Final   Special Requests BOTTLES DRAWN AEROBIC AND ANAEROBIC 5CCS   Final   Culture  Setup Time     Final   Value: 01/05/2014 14:37     [facility]formed at [facility]   Culture     Final   Value:        BLOOD CULTURE RECEIVED NO GROWTH TO DATE CULTURE WILL BE HELD FOR 5 DAYS BEFORE ISSUING A FINAL NEGATIVE REPORT     [facility]formed at [facility]   Report Status PENDING   Incomplete  MRSA PCR SCREENING     Status: Abnormal   Collection Time    01/05/14  1:47 PM      Result Value Ref Range Status   MRSA by PCR POSITIVE (*) NEGATIVE Final   Comment:            The GeneXpert MRSA Assay (FDA     approved for NASAL specimens     only), is one component of a     comprehensive MRSA colonization     surveillance program. It is not     intended to diagnose MRSA     infection nor to guide or     monitor treatment for     MRSA infections.     RESULT CALLED TO, READ BACK BY AND VERIFIED WITH:     [facility] RN 16:40 01/05/14 ([email or web address])     Studies:  Recent x-ray studies have been reviewed in detail by the Attending Physician  Scheduled Meds:  Scheduled Meds: . antiseptic oral rinse  15 mL Mouth Rinse BID  . aspirin EC  81 mg Oral Daily  . ceFEPime (MAXIPIME) IV  1 g Intravenous Q12H  . Chlorhexidine Gluconate Cloth  6 each Topical Q0600  . enoxaparin (LOVENOX) injection  40 mg  Subcutaneous Q24H  . feeding supplement (ENSURE)  1 Container Oral TID BM  . insulin aspart  0-5 Units Subcutaneous QHS  . insulin aspart  0-9 Units Subcutaneous TID WC  . metoprolol tartrate  25 mg Oral BID  . mupirocin ointment  1 application Nasal BID  . pantoprazole (PROTONIX) IV  40 mg Intravenous Q12H  . sodium chloride  3 mL Intravenous Q12H   Continuous Infusions:    Time spent on care of this patient: 25 min   Zannie CoveJOSEPH,Donelda Mailhot, MD  [facility] Office  [phone] Pager - Text Page per Loretha StaplerAmion as per below:  On-Call/Text Page:      [email or web address]      password [email or web address]  If 7PM-7AM, please contact night-coverage [email or web address] Password [email or web address] 01/09/2014, 1:29 PM   LOS: 4 days

## 2014-01-09 NOTE — Progress Notes (Signed)
NURSING PROGRESS NOTE  Savannah [name] [id number] Admission Data: 01/08/14 prior to this RNs arrival Attending Provider: Calvert CantorSaima Rizwan, MD GEX:[id number]:PANDEY, [facility] Inc[facility]HIMA, MD Code Status: DNR  Savannah [name] is a 80 y.o. female patient admitted from ED:  -No acute distress noted.  -No complaints of shortness of breath.  -No complaints of chest pain.   Cardiac Monitoring: Box # 21 in place. Cardiac monitor yields: atrial fibrillation  Blood pressure 117/67, pulse 87, temperature 98.2 F (36.8 C), temperature source Axillary, resp. rate 18, height 5\' 5"  (1.651 m), weight 82.7 kg (182 lb 5.1 oz), SpO2 97.00%.   IV Fluids:  IV in place, occlusive dsg intact without redness, IV cath antecubital left, condition patent and no redness normal saline.   Allergies:  Review of patient's allergies indicates no known allergies.  Past Medical History:   has a past medical history of Hypertension; Hypercholesteremia; Pelvis fracture; Kidney stones; Hyperglycemia; Diabetes mellitus without complication; CVA (cerebral infarction); and Hypoxia.  Past Surgical History:   has past surgical history that includes Abdominal hysterectomy.  Social History:   reports that she has quit smoking. She does not have any smokeless tobacco history on file. She reports that she does not drink alcohol or use illicit drugs.  Skin: Quarter sized red, flaky patch on lateral side of right thigh  Patient only alert to self at this time. No distress noted.

## 2014-01-09 NOTE — Progress Notes (Signed)
NUTRITION FOLLOW-UP  DOCUMENTATION CODES Per approved criteria  -Obesity Unspecified   INTERVENTION: Diet texture and liquid consistency per SLP discretion. Add Ensure Pudding po TID, each supplement provides 170 kcal and 4 grams of protein. Add Magic cup TID with meals, each supplement provides 290 kcal and 9 grams of protein. RD to continue to follow nutrition care plan.  NUTRITION DIAGNOSIS: Inadequate oral intake now related to acute illness and swallowing difficulties evidenced by limited PO intake.  Goal: Pt to meet >/= 90% of their estimated nutrition needs.   Monitor:  PO intake, supplement tolerance, weight, labs, I/O's  ASSESSMENT: Patient from [name] long term SN[facility]. She was brought in after being found confused and less responsive. For the last several weeks, patient has been getting weaker per daughter- unable to use walker to get to the bathroom. She does endorse SOB but no fever/chills, no trouble eating, no dysuria but is very hard of hearing.   In the ER, she was found to have a PNA and was in a fib with RVR- family denies history. She was also hypotensive but BP responded to IVF. Family confirms she is a DNR. In the ER she was [facility] and cardizem gtt for atrial fibrillation.   BSE completed by SLP on 2/28 - recommendations for NPO; repeated today as pt is more alert. SLP recommending Dysphagia 1 diet (purees) with no liquids. Pt is currently on her way down to [facility] to determine safety with PO's.  Height: Ht Readings from Last 1 Encounters:  01/05/14 5\' 5"  (1.651 m)    Weight: Wt Readings from Last 1 Encounters:  01/09/14 173 lb 8 oz (78.7 kg)  Admit wt 182 lb   BMI:  Body mass index is 28.87 kg/(m^2). Overweight  Estimated Nutritional Needs: Kcal: 1700-1900 Protein: 80-90 gm Fluid: 1.7-1.9 L  Skin: Intact  Diet Order: Dysphagia 1; Pudding Liquids  EDUCATION NEEDS: -No education needs identified at this time   Intake/Output  Summary (Last 24 hours) at 01/09/14 1140 Last data filed at 01/09/14 0900  Gross per 24 hour  Intake      0 ml  Output    600 ml  Net   -600 ml   Last BM: 2/26  Labs:   Recent Labs Lab 01/06/14 0630 01/07/14 1110 01/08/14 0900  NA 142 142 145  K 4.5 3.5* 3.7  CL 103 93* 96  CO2 29 31 34*  BUN 11 12 17   CREATININE 0.66 0.82 0.80  CALCIUM 8.4 8.4 8.7  GLUCOSE 120* 130* 117*    CBG (last 3)   Recent Labs  01/08/14 1618 01/08/14 2122 01/09/14 0815  GLUCAP 110* 118* 120*    Scheduled Meds: . antiseptic oral rinse  15 mL Mouth Rinse BID  . aspirin EC  81 mg Oral Daily  . ceFEPime (MAXIPIME) IV  1 g Intravenous Q12H  . Chlorhexidine Gluconate Cloth  6 each Topical Q0600  . enoxaparin (LOVENOX) injection  40 mg Subcutaneous Q24H  . insulin aspart  0-5 Units Subcutaneous QHS  . insulin aspart  0-9 Units Subcutaneous TID WC  . metoprolol tartrate  25 mg Oral BID  . mupirocin ointment  1 application Nasal BID  . pantoprazole (PROTONIX) IV  40 mg Intravenous Q12H  . sodium chloride  3 mL Intravenous Q12H  . vancomycin  750 mg Intravenous Q12H    Continuous Infusions:     Jarold MottoSamantha Jaelene Garciagarcia MS, RD, LDN Inpatient Registered Dietitian Pager: [phone] After-hours pager: [phone]

## 2014-01-09 NOTE — Progress Notes (Signed)
Speech Language Pathology Treatment: Dysphagia  Patient Details Name: Savannah [name] R Pledger MRN: [id number] DOB: 06/10/1919 Today's Date: 01/09/2014 Time: [id number] SLP Time Calculation (min): 22 min  Assessment / Plan / Recommendation Clinical Impression  Pts level of alertness, oral health and function much improved since last SLP session. Pt now fully alert, automatic with teaspoon bites of thickened liquids and sips of water. No wet vocal quality observed though RR still labored with poor coordination of swallow and inhalation. Aspiration risk remains moderate, but pt has good potential to tolerate PO diet. Recommend initiating Dys 1 (puree) and pudding for now for meds and am meal. MBS at 1130 to determine safety with PO.    HPI HPI: From [name] long term [facility].  She was brought in after being found confused and less responsive.  For the last several weeks, patient has been getting weaker per daughter- unable to use walker to get to the bathroom.  She does endorse SOB but no fever/chills, no trouble eating, no dysuria but is very hard of hearing.  In the ER, she was found to have a PNA and was in a fib with RVR- family denies history.  She was also hypotensive but BP responded to IVF.  Family confirms she is a DNR.  In the ER she was given [facility] and cardizem gtt for atrial fibrillation.   Pertinent Vitals NA  SLP Plan  MBS    Recommendations Diet recommendations: Dysphagia 1 (puree);Pudding-thick liquid Liquids provided via: Teaspoon Medication Administration: Crushed with puree              Oral Care Recommendations: Oral care Q4 per protocol Follow up Recommendations: Skilled Nursing facility Plan: MBS    GO    Harlon DittyBonnie Makenah Karas, [place]MA CCC-SLP [phone]  Claudine MoutonDeBlois, Alixis Harmon Caroline 01/09/2014, 8:46 AM

## 2014-01-10 ENCOUNTER — Inpatient Hospital Stay (HOSPITAL_COMMUNITY): Payer: PRIVATE HEALTH INSURANCE

## 2014-01-10 DIAGNOSIS — I4891 Unspecified atrial fibrillation: Secondary | ICD-10-CM

## 2014-01-10 DIAGNOSIS — I10 Essential (primary) hypertension: Secondary | ICD-10-CM

## 2014-01-10 DIAGNOSIS — I509 Heart failure, unspecified: Secondary | ICD-10-CM

## 2014-01-10 DIAGNOSIS — J189 Pneumonia, unspecified organism: Secondary | ICD-10-CM

## 2014-01-10 DIAGNOSIS — J9600 Acute respiratory failure, unspecified whether with hypoxia or hypercapnia: Secondary | ICD-10-CM

## 2014-01-10 LAB — BASIC METABOLIC PANEL
BUN: 26 mg/dL — AB (ref 6–23)
CALCIUM: 9.1 mg/dL (ref 8.4–10.5)
CO2: 38 mEq/L — ABNORMAL HIGH (ref 19–32)
Chloride: 100 mEq/L (ref 96–112)
Creatinine, Ser: 0.82 mg/dL (ref 0.50–1.10)
GFR calc Af Amer: 70 mL/min — ABNORMAL LOW (ref >90)
GFR calc non Af Amer: 60 mL/min — ABNORMAL LOW (ref >90)
GLUCOSE: 117 mg/dL — AB (ref 70–99)
Potassium: 3.1 mEq/L — ABNORMAL LOW (ref 3.7–5.3)
Sodium: 148 mEq/L — ABNORMAL HIGH (ref 137–147)

## 2014-01-10 LAB — GLUCOSE, CAPILLARY
Glucose-Capillary: 125 mg/dL — ABNORMAL HIGH (ref 70–99)
Glucose-Capillary: 160 mg/dL — ABNORMAL HIGH (ref 70–99)
Glucose-Capillary: 142 mg/dL — ABNORMAL HIGH (ref 70–99)
Glucose-Capillary: 131 mg/dL — ABNORMAL HIGH (ref 70–99)

## 2014-01-10 LAB — CBC
HCT: 38.5 % (ref 36.0–46.0)
Hemoglobin: 11.4 g/dL — ABNORMAL LOW (ref 12.0–15.0)
MCH: 24.4 pg — AB (ref 26.0–34.0)
MCHC: 29.6 g/dL — AB (ref 30.0–36.0)
MCV: 82.4 fL (ref 78.0–100.0)
PLATELETS: 333 10*3/uL (ref 150–400)
RBC: 4.67 MIL/uL (ref 3.87–5.11)
RDW: 16.3 % — ABNORMAL HIGH (ref 11.5–15.5)
WBC: 7.6 10*3/uL (ref 4.0–10.5)

## 2014-01-10 MED ORDER — CLOPIDOGREL BISULFATE 75 MG PO TABS
75.0000 mg | ORAL_TABLET | Freq: Every day | ORAL | Status: DC
  Administered 2014-01-10 – 2014-01-12 (×3): 75 mg via ORAL
  Filled 2014-01-10 (×5): qty 1

## 2014-01-10 MED ORDER — HYDRALAZINE HCL 20 MG/ML IJ SOLN
10.0000 mg | Freq: Once | INTRAMUSCULAR | Status: DC
  Filled 2014-01-10: qty 1

## 2014-01-10 MED ORDER — FUROSEMIDE 20 MG PO TABS
20.0000 mg | ORAL_TABLET | Freq: Every day | ORAL | Status: DC
  Administered 2014-01-10: 20 mg via ORAL
  Filled 2014-01-10: qty 1

## 2014-01-10 NOTE — Progress Notes (Signed)
On assessment patient noted to be very lethargic and hard to arouse. Oriented to birthday only after much stimulation. Bilateral hand grips Right stronger than left. Left Pupil 4 right 3 and both sluggish and left eye deviated to left. Unable to raise left eyebrow. Thorough mouth care given  , sat patient up in bed and washed face. After much stimulation pt was able to swallow medication in applesauce. Natalia LeatherwoodKatherine NP with [facility] notified and no further orders given.

## 2014-01-10 NOTE — Progress Notes (Signed)
ANTIBIOTIC CONSULT NOTE - FOLLOW UP  Pharmacy Consult for cefepime Indication: rule out sepsis  No Known Allergies Patient Measurements: Height: 5\' 5"  (165.1 cm) Weight: 172 lb 13.5 oz (78.4 kg) IBW/kg (Calculated) : 57 Vital Signs: Temp: 97.4 F (36.3 C) (03/03 0743) Temp src: Oral (03/03 0743) BP: 150/70 mmHg (03/03 0937) Pulse Rate: 75 (03/03 0937) Intake/Output from previous day: 03/02 0701 - 03/03 0700 In: 100 [P.O.:100] Out: 770 [Urine:770] Intake/Output from this shift: Total I/O In: 3 [I.V.:3] Out: -  Labs:  Recent Labs  01/08/14 0900 01/10/14 0429  WBC  --  7.6  HGB  --  11.4*  PLT  --  333  CREATININE 0.80 0.82   Assessment: 80 year old female, completing course of antibiotics for acute respiratory failure/HCAP.  Pt is afebrile, without leukocytosis . Cultures no growth to date.  Pt completed 5 days of vancomycin, today is day #6 of cefepime.  Goal of Therapy:  Eradication of infection  Plan:  Continue cefepime 1g IV q12h.  Length of therapy? Consider treating for 7 day course  Agapito GamesAlison Kyelle Urbas, PharmD, BCPS Clinical Pharmacist Pager: [phone] 01/10/2014 1:01 PM

## 2014-01-10 NOTE — Progress Notes (Signed)
[facility] you [facility] to meet your patient's and family's needs.   The reason that you asked us to see your patient is for GOC and options.   We have scheduled your patient for a meeting: 3/4 3:30 pm  The Surrogate decision make is: Everlean Pattersonancy Smith (daughter) Contact information: [phone], [phone]  Other family members that need to be present: Murlean Ibaarey Minner (son)    Your patient is able/unable to participate: unable  Yong ChannelAlicia Davontae Prusinski, NP Palliative Medicine Team Pager # [phone] (M-F 8a-5p) Team Phone # [phone] (Nights/Weekends)

## 2014-01-10 NOTE — Progress Notes (Signed)
Speech Language Pathology Treatment: Dysphagia  Patient Details Name: Savannah [name] MRN: [id number] DOB: 07/17/1919 Today's Date: 01/10/2014 Time: [id number] SLP Time Calculation (min): 20 min  Assessment / Plan / Recommendation Clinical Impression  Pt toleating nectar thick liquids via teaspoon as anticipated. Pt has fewer apparent aspiration events, but diet not expected to fully prevent aspiration. Discussed at length with son at bedside. Also instructed him on thickening liquids and silent aspiration. Will continue current diet.    HPI HPI: From [name] long term [facility].  She was brought in after being found confused and less responsive.  For the last several weeks, patient has been getting weaker per daughter- unable to use walker to get to the bathroom.  She does endorse SOB but no fever/chills, no trouble eating, no dysuria but is very hard of hearing.  In the ER, she was found to have a PNA and was in a fib with RVR- family denies history.  She was also hypotensive but BP responded to IVF.  Family confirms she is a DNR.  In the ER [facility] was [facility] and cardizem gtt for atrial fibrillation.   Pertinent Vitals NA  SLP Plan  Continue with current plan of care    Recommendations Diet recommendations: Dysphagia 1 (puree);Nectar-thick liquid Liquids provided via: Teaspoon Medication Administration: Crushed with puree Supervision: Staff to assist with self feeding;Full supervision/cueing for compensatory strategies Compensations: Slow rate;Small sips/bites;Clear throat intermittently Postural Changes and/or Swallow Maneuvers: Seated upright 90 degrees;Upright 30-60 min after meal              Plan: Continue with current plan of care    GO    [facility] HospitalBonnie Hezzie Karim, MA CCC-SLP [phone]  Claudine MoutonDeBlois, Safwan Tomei Caroline 01/10/2014, 3:52 PM

## 2014-01-10 NOTE — Progress Notes (Addendum)
[facility] Progress Note  TEAM 1 transfer   Savannah [name] ZOX:[id number] DOB: [birth year] DOA: 01/05/2014 PCP: Oneal Grout, MD  Brief narrative: Savannah [name] is a 80 y.o. female presenting on 01/05/2014 with  has a past medical history of Hypertension; Hypercholesteremia; Pelvis fracture; Kidney stones; Diabetes mellitus, CVA (cerebral infarction) who presented from [name] long term SNF. She was brought in after being found confused and less responsive. For the last several weeks, patient has been getting weaker per daughter- unable to use walker to get to the bathroom. She did endorse SOB and cough. Very hard of hearing, She was diagnosed with PNA in the ER and noted to have Hypotension, A-fib with RVR and admitted to SDU, required Cardizem gtt initially. SHe was treated with IV antibiotics and then developed volume overload and was diuresed briefly while in SDU and transferred to Floor 3/2.  She had swallow eval, started on diet, but continued to be lethargic off and on, today I discussed palliative care with family and consulted PCM, also got CT head which showed R MCA infarct, d/w Neurology started Plavix, awaiting Palliative eval at this point.  Subjective: Lethargic this am  Assessment/Plan:    Acute respiratory failure - likely due to CHF from uncontrolled HR and diastolic dysfunction and HCAP  - Stopped Vanc, continue Cefepime - SLP following, s/p MBS with moderate dysphagia, now on D1 diet with nectar thick liquids - stopped IVF and started aggressive diuresis- appears to be euvolemic now- Bicarb rising - hold PO lasix since euvolemic now    P.Afibrillation - newly diagnosed and now converted back to NSR - ECHO completed- see below - not a candidate for anticoagulation , due to advanced age/fall risk - will cont ASA 81mg  - cont Metoprolol Po - off cardizem gtt   Lethargy -likely related to above -will get CT head, some localizing signs    Diastolic CHF, acute on  chronic - ECHO reveals LVH- diuresed as above, now euvolemic - hold PO lasix - Bmet in am    HTN - cont Metoprolol PO    DM -Type II or unspecified type diabetes mellitus without mention of complication, uncontrolled   Dementia   Nutrition  - diet pending speech eval   Dysphagia  -on D1 diet as noted above  -moderate dysphagia  Ethics: due to advanced age, dementia, dysphagia and acute illness will request Palliative consult for Goals of care  DVT proph: lovenox  1pm 01/10/14 Addendum:  CT head medium sized R MCA infarct noted on CT head, possibly occurred around time of admission, called and d/w daughter Savannah [name]. I also d/w Neurology Dr.Camillo, will avoid anticoagulation due to Fall risk, age  Transition to Plavix, DC ASA Will not get MRI/MRA since this will not change management at her age Also palliative to evaluate  Code Status: DNR Family Communication: Dr. Butler Denmark d/w Granddaughter at bedside on 2/27, no family at bedside now I called and d/w daughter Disposition Plan: SNF when stable  Consultants: none  Procedures: none  Antibiotics: Antibiotics Given (last 72 hours)   Date/Time Action Medication Dose Rate   01/07/14 1346 Given  [awaiting vanc trough]   vancomycin (VANCOCIN) IVPB 750 mg/150 ml premix 750 mg 150 mL/hr   01/07/14 2202 Given   ceFEPIme (MAXIPIME) 1 g in dextrose 5 % 50 mL IVPB 1 g 100 mL/hr   01/07/14 2327 Given   vancomycin (VANCOCIN) IVPB 750 mg/150 ml premix 750 mg 150 mL/hr   01/08/14 0914  Given   ceFEPIme (MAXIPIME) 1 g in dextrose 5 % 50 mL IVPB 1 g 100 mL/hr   01/08/14 1313 Given   vancomycin (VANCOCIN) IVPB 750 mg/150 ml premix 750 mg 150 mL/hr   01/08/14 2321 Given   ceFEPIme (MAXIPIME) 1 g in dextrose 5 % 50 mL IVPB 1 g 100 mL/hr   01/09/14 0129 Given  [med not available]   vancomycin (VANCOCIN) IVPB 750 mg/150 ml premix 750 mg 150 mL/hr   01/09/14 1003 Given   ceFEPIme (MAXIPIME) 1 g in dextrose 5 % 50 mL IVPB 1 g 100  mL/hr   01/09/14 1300 Given   vancomycin (VANCOCIN) IVPB 750 mg/150 ml premix 750 mg 150 mL/hr   01/09/14 2107 Given   ceFEPIme (MAXIPIME) 1 g in dextrose 5 % 50 mL IVPB 1 g 100 mL/hr   01/10/14 0947 Given   ceFEPIme (MAXIPIME) 1 g in dextrose 5 % 50 mL IVPB 1 g 100 mL/hr       DVT prophylaxis: Lovenox  Objective: Filed Weights   01/05/14 1347 01/09/14 0501 01/10/14 0351  Weight: 82.7 kg (182 lb 5.1 oz) 78.7 kg (173 lb 8 oz) 78.4 kg (172 lb 13.5 oz)   Blood pressure 150/70, pulse 75, temperature 97.4 F (36.3 C), temperature source Oral, resp. rate 18, height 5\' 5"  (1.651 m), weight 78.4 kg (172 lb 13.5 oz), SpO2 96.00%.  Intake/Output Summary (Last 24 hours) at 01/10/14 1058 Last data filed at 01/10/14 4540  Gross per 24 hour  Intake    103 ml  Output    770 ml  Net   -667 ml     Exam: General: Lethargic, arousible, awake- oreinted to self only, No acute respiratory distress Lungs: Clear to auscultation bilaterally without wheezes or crackles- very poor air entry- congested cough- on 1 L  Cardiovascular: Regular rate and rhythm without murmur gallop or rub normal S1 and S2 Abdomen: Nontender, nondistended, soft, bowel sounds positive, no rebound, no ascites, no appreciable mass Extremities: No significant cyanosis, clubbing, or edema bilateral lower extremities Neuro: L side weaker than right, poor effort  Data Reviewed: Basic Metabolic Panel:  Recent Labs Lab 01/05/14 0936 01/06/14 0630 01/07/14 1110 01/08/14 0900 01/10/14 0429  NA 140 142 142 145 148*  K 4.5 4.5 3.5* 3.7 3.1*  CL 98 103 93* 96 100  CO2 28 29 31  34* 38*  GLUCOSE 134* 120* 130* 117* 117*  BUN 13 11 12 17  26*  CREATININE 0.69 0.66 0.82 0.80 0.82  CALCIUM 8.6 8.4 8.4 8.7 9.1   Liver Function Tests:  Recent Labs Lab 01/05/14 0936  AST 19  ALT 11  ALKPHOS 63  BILITOT 0.5  PROT 6.6  ALBUMIN 2.5*   No results found for this basename: LIPASE, AMYLASE,  in the last 168 hours No results  found for this basename: AMMONIA,  in the last 168 hours CBC:  Recent Labs Lab 01/05/14 0936 01/10/14 0429  WBC 10.2 7.6  NEUTROABS 7.3  --   HGB 12.4 11.4*  HCT 39.8 38.5  MCV 80.4 82.4  PLT 233 333   Cardiac Enzymes: No results found for this basename: CKTOTAL, CKMB, CKMBINDEX, TROPONINI,  in the last 168 hours BNP (last 3 results)  Recent Labs  08/23/13 2224  PROBNP 184.6   CBG:  Recent Labs Lab 01/09/14 0815 01/09/14 1442 01/09/14 1645 01/09/14 2200 01/10/14 0738  GLUCAP 120* 152* 202* 128* 125*    Recent Results (from the past 240 hour(s))  URINE CULTURE  Status: None   Collection Time    01/05/14  9:43 AM      Result Value Ref Range Status   Specimen Description URINE, RANDOM   Final   Special Requests NONE   Final   Culture  Setup Time     Final   Value: 01/05/2014 14:52     Performed at [facility]   Colony Count     Final   Value: NO GROWTH     Performed at [facility]   Culture     Final   Value: NO GROWTH     Performed at [facility]   Report Status 01/06/2014 FINAL   Final  CULTURE, BLOOD (ROUTINE X 2)     Status: None   Collection Time    01/05/14 10:14 AM      Result Value Ref Range Status   Specimen Description BLOOD RIGHT ANTECUBITAL   Final   Special Requests BOTTLES DRAWN AEROBIC AND ANAEROBIC 10CCS   Final   Culture  Setup Time     Final   Value: 01/05/2014 14:37     Performed at [facility]   Culture     Final   Value:        BLOOD CULTURE RECEIVED NO GROWTH TO DATE CULTURE WILL BE HELD FOR 5 DAYS BEFORE ISSUING A FINAL NEGATIVE REPORT     Performed at [facility]   Report Status PENDING   Incomplete  CULTURE, BLOOD (ROUTINE X 2)     Status: None   Collection Time    01/05/14 10:26 AM      Result Value Ref Range Status   Specimen Description BLOOD LEFT ANTECUBITAL   Final   Special Requests BOTTLES DRAWN AEROBIC AND ANAEROBIC 5CCS   Final   Culture  Setup Time     Final   Value:  01/05/2014 14:37     Performed at [facility]   Culture     Final   Value:        BLOOD CULTURE RECEIVED NO GROWTH TO DATE CULTURE WILL BE HELD FOR 5 DAYS BEFORE ISSUING A FINAL NEGATIVE REPORT     Performed at [facility]   Report Status PENDING   Incomplete  MRSA PCR SCREENING     Status: Abnormal   Collection Time    01/05/14  1:47 PM      Result Value Ref Range Status   MRSA by PCR POSITIVE (*) NEGATIVE Final   Comment:            The GeneXpert MRSA Assay (FDA     approved for NASAL specimens     only), is one component of a     comprehensive MRSA colonization     surveillance program. It is not     intended to diagnose MRSA     infection nor to guide or     monitor treatment for     MRSA infections.     RESULT CALLED TO, READ BACK BY AND VERIFIED WITH:     [facility] RN 16:40 01/05/14 ([email or web address])     Studies:  Recent x-ray studies have been reviewed in detail by the Attending Physician  Scheduled Meds:  Scheduled Meds: . antiseptic oral rinse  15 mL Mouth Rinse BID  . aspirin EC  81 mg Oral Daily  . ceFEPime (MAXIPIME) IV  1 g Intravenous Q12H  . Chlorhexidine Gluconate Cloth  6 each Topical Q0600  .  enoxaparin (LOVENOX) injection  30 mg Subcutaneous QHS  . feeding supplement (ENSURE)  1 Container Oral TID BM  . hydrALAZINE  10 mg Intravenous Once  . insulin aspart  0-5 Units Subcutaneous QHS  . insulin aspart  0-9 Units Subcutaneous TID WC  . metoprolol tartrate  25 mg Oral BID  . mupirocin ointment  1 application Nasal BID  . pantoprazole sodium  40 mg Oral Daily  . sodium chloride  3 mL Intravenous Q12H   Continuous Infusions:    Time spent on care of this patient: 25 min   Zannie CoveJOSEPH,Akai Dollard, MD [phone]  [facility] Office  [phone] Pager - Text Page per Amion as per below:  On-Call/Text Page:      [email or web address]      password [email or web address]  If 7PM-7AM, please contact night-coverage [email or web address] Password [email or web address] 01/10/2014, 10:58 AM   LOS:  5 days

## 2014-01-10 NOTE — Care Management Note (Signed)
    Page 1 of 2   01/16/2014     1:47:22 PM   CARE MANAGEMENT NOTE 01/16/2014  Patient:  Savannah [name],Savannah [name]   Account Number:  [id number]  Date Initiated:  01/05/2014  Documentation initiated by:  Donn PieriniWEBSTER,KRISTI  Subjective/Objective Assessment:   Pt admitted with AMS- PNA, Afib with RVR     Action/Plan:   PTA [facility]- CSW consulted for placement needs   Anticipated DC Date:  01/16/2014   Anticipated DC Plan:  SKILLED NURSING FACILITY  In-house referral  Clinical Social Worker         Choice offered to / List presented to:             Status of service:  Completed, signed off Medicare Important Message given?   (If response is "NO", the following Medicare IM given date fields will be blank) Date Medicare IM given:   Date Additional Medicare IM given:    Discharge Disposition:  SKILLED NURSING FACILITY  Per UR Regulation:  Reviewed for med. necessity/level of care/duration of stay  If discussed at Long Length of Stay Meetings, dates discussed:   01/10/2014  01/12/2014    Comments:  01/16/14 1346 Savannah Ladona Ridgelaylor RN, BSN [phone] patient for dc to [facility] following.  CSW following.  01/13/14 1028 Savannah Capeeborah Roselyne Stalnaker RN,BSN [phone] per MD patient is declining and they are having a palliative meeting with Dr. Ladona [name] at 2:30 today to make decision.  01/12/14 1049 Savannah Capeeborah Gildo Crisco RN, BSN [phone] patient with new [name] mca, palliative meeting on 3/4, family wants patient to continue receiving care, plan is to return back to snf.  Per MD patient did not sound good today, will get a cxr.  01/10/14 1743 Savannah Capeeborah Kambri Dismore RN, BSN [phone] patient very lethargic this am, ct revealed [name] mca, patient for palliative care meeting on 3/4.

## 2014-01-11 DIAGNOSIS — Z515 Encounter for palliative care: Secondary | ICD-10-CM

## 2014-01-11 DIAGNOSIS — F0390 Unspecified dementia without behavioral disturbance: Secondary | ICD-10-CM

## 2014-01-11 LAB — CULTURE, BLOOD (ROUTINE X 2)
Culture: NO GROWTH
Culture: NO GROWTH

## 2014-01-11 LAB — CBC
HEMATOCRIT: 38.8 % (ref 36.0–46.0)
Hemoglobin: 11.5 g/dL — ABNORMAL LOW (ref 12.0–15.0)
MCH: 24.6 pg — ABNORMAL LOW (ref 26.0–34.0)
MCHC: 29.6 g/dL — AB (ref 30.0–36.0)
MCV: 83.1 fL (ref 78.0–100.0)
Platelets: 317 10*3/uL (ref 150–400)
RBC: 4.67 MIL/uL (ref 3.87–5.11)
RDW: 16.2 % — ABNORMAL HIGH (ref 11.5–15.5)
WBC: 7.9 10*3/uL (ref 4.0–10.5)

## 2014-01-11 LAB — BASIC METABOLIC PANEL
BUN: 26 mg/dL — AB (ref 6–23)
CO2: 37 mEq/L — ABNORMAL HIGH (ref 19–32)
CREATININE: 0.76 mg/dL (ref 0.50–1.10)
Calcium: 9.2 mg/dL (ref 8.4–10.5)
Chloride: 99 mEq/L (ref 96–112)
GFR calc Af Amer: 82 mL/min — ABNORMAL LOW (ref >90)
GFR calc non Af Amer: 71 mL/min — ABNORMAL LOW (ref >90)
GLUCOSE: 110 mg/dL — AB (ref 70–99)
Potassium: 3.6 mEq/L — ABNORMAL LOW (ref 3.7–5.3)
Sodium: 145 mEq/L (ref 137–147)

## 2014-01-11 LAB — GLUCOSE, CAPILLARY
GLUCOSE-CAPILLARY: 172 mg/dL — AB (ref 70–99)
GLUCOSE-CAPILLARY: 142 mg/dL — AB (ref 70–99)
GLUCOSE-CAPILLARY: 163 mg/dL — AB (ref 70–99)
Glucose-Capillary: 111 mg/dL — ABNORMAL HIGH (ref 70–99)

## 2014-01-11 MED ORDER — SENNA 8.6 MG PO TABS
2.0000 | ORAL_TABLET | Freq: Every day | ORAL | Status: DC
  Administered 2014-01-11 – 2014-01-14 (×4): 17.2 mg via ORAL
  Filled 2014-01-11 (×4): qty 2

## 2014-01-11 NOTE — Progress Notes (Signed)
[facility] Progress Note  TEAM 1 transfer   NAIRA STANDIFORD ZOX:[id number] DOB: 02-22-1919 DOA: 01/05/2014 PCP: Oneal Grout, MD  Brief narrative: Savannah [name] is a 80 y.o. female presenting on 01/05/2014 with  has a past medical history of Hypertension; Hypercholesteremia; Pelvis fracture; Kidney stones; Diabetes mellitus, CVA (cerebral infarction) who presented from [name] long term SNF. She was brought in after being found confused and less responsive. For the last several weeks, patient has been getting weaker per daughter- unable to use walker to get to the bathroom. She did endorse SOB and cough. Very hard of hearing, She was diagnosed with PNA in the ER and noted to have Hypotension, A-fib with RVR and admitted to SDU, required Cardizem gtt initially. SHe was treated with IV antibiotics and then developed volume overload and was diuresed briefly while in SDU and transferred to Floor 3/2.  She had swallow eval, started on diet, but continued to be lethargic off and on. CT head which showed R MCA infarct, Dr. Jomarie Longs d/w Neurology and started Plavix. Awaiting palliative care evaluation.  Subjective: Patient complains of headache. As per nursing, no BM in several days but patient denies abdominal pain, nausea vomiting.  Assessment/Plan:    Acute respiratory failure - likely due to CHF from uncontrolled HR and diastolic dysfunction and HCAP  - Stopped Vanc, continue Cefepime - SLP following, s/p MBS with moderate dysphagia, now on D1 diet with nectar thick liquids - stopped IVF and started aggressive diuresis- appears to be euvolemic now- Bicarb rising - hold PO lasix since euvolemic now    P.Afibrillation - newly diagnosed and now converted back to NSR - ECHO completed- see below - not a candidate for anticoagulation , due to advanced age/fall risk - will cont ASA 81mg  - cont Metoprolol Po - off cardizem gtt   Lethargy - Probably multifactorial secondary to acute illness,  mild hypernatremia complicating advanced age and dementia.     Diastolic CHF, acute on chronic - ECHO reveals LVH- diuresed as above, now euvolemic - hold PO lasix    HTN - cont Metoprolol PO    DM -Type II or unspecified type diabetes mellitus without mention of complication, uncontrolled   Dementia   Nutrition  - Continue current dysphagia 1 diet   Dysphagia  -on D1 diet as noted above  -moderate dysphagia  Ethics: due to advanced age, dementia, dysphagia and acute illness Dr. Jomarie Longs requested Palliative consult for Goals of care and are supposed to meet on 3/4  Right MCA infarct - Dr. Jomarie Longs discussed with neurology on 3/3: Avoid anticoagulation due to fall risk and age. Advised to DC aspirin and start Plavix. No further workup planned because it will not change management at her age. Also palliative care team to evaluate.  Anemia - Stable  Constipation - Start Bowel regimen.   DVT proph: lovenox  Code Status: DNR Family Communication: no family at bedside now Disposition Plan: To be determined  Consultants: Palliative care team  Procedures: none  Antibiotics: Antibiotics Given (last 72 hours)   Date/Time Action Medication Dose Rate   01/08/14 0914 Given   ceFEPIme (MAXIPIME) 1 g in dextrose 5 % 50 mL IVPB 1 g 100 mL/hr   01/08/14 1313 Given   vancomycin (VANCOCIN) IVPB 750 mg/150 ml premix 750 mg 150 mL/hr   01/08/14 2321 Given   ceFEPIme (MAXIPIME) 1 g in dextrose 5 % 50 mL IVPB 1 g 100 mL/hr   01/09/14 0129 Given  [med not  available]   vancomycin (VANCOCIN) IVPB 750 mg/150 ml premix 750 mg 150 mL/hr   01/09/14 1003 Given   ceFEPIme (MAXIPIME) 1 g in dextrose 5 % 50 mL IVPB 1 g 100 mL/hr   01/09/14 1300 Given   vancomycin (VANCOCIN) IVPB 750 mg/150 ml premix 750 mg 150 mL/hr   01/09/14 2107 Given   ceFEPIme (MAXIPIME) 1 g in dextrose 5 % 50 mL IVPB 1 g 100 mL/hr   01/10/14 0947 Given   ceFEPIme (MAXIPIME) 1 g in dextrose 5 % 50 mL IVPB 1 g 100 mL/hr    01/10/14 2217 Given   ceFEPIme (MAXIPIME) 1 g in dextrose 5 % 50 mL IVPB 1 g 100 mL/hr       DVT prophylaxis: Lovenox  Objective: Filed Weights   01/09/14 0501 01/10/14 0351 01/11/14 0640  Weight: 78.7 kg (173 lb 8 oz) 78.4 kg (172 lb 13.5 oz) 78.2 kg (172 lb 6.4 oz)   Blood pressure 125/72, pulse 66, temperature 97.9 F (36.6 C), temperature source Oral, resp. rate 20, height 5\' 5"  (1.651 m), weight 78.2 kg (172 lb 6.4 oz), SpO2 97.00%.  Intake/Output Summary (Last 24 hours) at 01/11/14 0733 Last data filed at 01/11/14 1610  Gross per 24 hour  Intake    373 ml  Output    450 ml  Net    -77 ml     Exam: General: Lethargic, arousible, awake- oreinted to self only, No acute respiratory distress Lungs: Slightly diminished breath sounds in the bases but otherwise clear to auscultation. No increased work of breathing  Cardiovascular: Regular rate and rhythm without murmur gallop or rub normal S1 and S2.  Abdomen: Nontender, nondistended, soft, bowel sounds positive, no rebound, no ascites, no appreciable mass Extremities: No significant cyanosis, clubbing, or edema bilateral lower extremities Neuro: Alert and oriented only to self. No obvious focal deficits  Data Reviewed: Basic Metabolic Panel:  Recent Labs Lab 01/05/14 0936 01/06/14 0630 01/07/14 1110 01/08/14 0900 01/10/14 0429  NA 140 142 142 145 148*  K 4.5 4.5 3.5* 3.7 3.1*  CL 98 103 93* 96 100  CO2 28 29 31  34* 38*  GLUCOSE 134* 120* 130* 117* 117*  BUN 13 11 12 17  26*  CREATININE 0.69 0.66 0.82 0.80 0.82  CALCIUM 8.6 8.4 8.4 8.7 9.1   Liver Function Tests:  Recent Labs Lab 01/05/14 0936  AST 19  ALT 11  ALKPHOS 63  BILITOT 0.5  PROT 6.6  ALBUMIN 2.5*   No results found for this basename: LIPASE, AMYLASE,  in the last 168 hours No results found for this basename: AMMONIA,  in the last 168 hours CBC:  Recent Labs Lab 01/05/14 0936 01/10/14 0429 01/11/14 0615  WBC 10.2 7.6 7.9  NEUTROABS  7.3  --   --   HGB 12.4 11.4* 11.5*  HCT 39.8 38.5 38.8  MCV 80.4 82.4 83.1  PLT 233 333 317   Cardiac Enzymes: No results found for this basename: CKTOTAL, CKMB, CKMBINDEX, TROPONINI,  in the last 168 hours BNP (last 3 results)  Recent Labs  08/23/13 2224  PROBNP 184.6   CBG:  Recent Labs Lab 01/09/14 2200 01/10/14 0738 01/10/14 1155 01/10/14 1742 01/10/14 2202  GLUCAP 128* 125* 160* 142* 131*    Recent Results (from the past 240 hour(s))  URINE CULTURE     Status: None   Collection Time    01/05/14  9:43 AM      Result Value Ref Range Status  Specimen Description URINE, RANDOM   Final   Special Requests NONE   Final   Culture  Setup Time     Final   Value: 01/05/2014 14:52     Performed at [facility]   Colony Count     Final   Value: NO GROWTH     Performed at [facility]   Culture     Final   Value: NO GROWTH     Performed at [facility]   Report Status 01/06/2014 FINAL   Final  CULTURE, BLOOD (ROUTINE X 2)     Status: None   Collection Time    01/05/14 10:14 AM      Result Value Ref Range Status   Specimen Description BLOOD RIGHT ANTECUBITAL   Final   Special Requests BOTTLES DRAWN AEROBIC AND ANAEROBIC 10CCS   Final   Culture  Setup Time     Final   Value: 01/05/2014 14:37     Performed at [facility]   Culture     Final   Value:        BLOOD CULTURE RECEIVED NO GROWTH TO DATE CULTURE WILL BE HELD FOR 5 DAYS BEFORE ISSUING A FINAL NEGATIVE REPORT     Performed at [facility]   Report Status PENDING   Incomplete  CULTURE, BLOOD (ROUTINE X 2)     Status: None   Collection Time    01/05/14 10:26 AM      Result Value Ref Range Status   Specimen Description BLOOD LEFT ANTECUBITAL   Final   Special Requests BOTTLES DRAWN AEROBIC AND ANAEROBIC 5CCS   Final   Culture  Setup Time     Final   Value: 01/05/2014 14:37     Performed at [facility]   Culture     Final   Value:        BLOOD CULTURE  RECEIVED NO GROWTH TO DATE CULTURE WILL BE HELD FOR 5 DAYS BEFORE ISSUING A FINAL NEGATIVE REPORT     Performed at [facility]   Report Status PENDING   Incomplete  MRSA PCR SCREENING     Status: Abnormal   Collection Time    01/05/14  1:47 PM      Result Value Ref Range Status   MRSA by PCR POSITIVE (*) NEGATIVE Final   Comment:            The GeneXpert MRSA Assay (FDA     approved for NASAL specimens     only), is one component of a     comprehensive MRSA colonization     surveillance program. It is not     intended to diagnose MRSA     infection nor to guide or     monitor treatment for     MRSA infections.     RESULT CALLED TO, READ BACK BY AND VERIFIED WITH:     [facility] RN 16:40 01/05/14 ([email or web address])     Studies:  Recent x-ray studies have been reviewed in detail by the Attending Physician  Scheduled Meds:  Scheduled Meds: . antiseptic oral rinse  15 mL Mouth Rinse BID  . ceFEPime (MAXIPIME) IV  1 g Intravenous Q12H  . Chlorhexidine Gluconate Cloth  6 each Topical Q0600  . clopidogrel  75 mg Oral Daily  . enoxaparin (LOVENOX) injection  30 mg Subcutaneous QHS  . feeding supplement (ENSURE)  1 Container Oral TID BM  . hydrALAZINE  10 mg Intravenous  Once  . insulin aspart  0-5 Units Subcutaneous QHS  . insulin aspart  0-9 Units Subcutaneous TID WC  . metoprolol tartrate  25 mg Oral BID  . mupirocin ointment  1 application Nasal BID  . pantoprazole sodium  40 mg Oral Daily  . sodium chloride  3 mL Intravenous Q12H   Continuous Infusions:    Time spent on care of this patient: 25 min   Union Surgery Center LLCNGALGI,ANAND, MD [phone]  [facility] Office  [phone] Pager - Text Page per Amion as per below:  On-Call/Text Page:      [email or web address]      password [email or web address]  If 7PM-7AM, please contact night-coverage [email or web address] Password [email or web address] 01/11/2014, 7:33 AM   LOS: 6 days

## 2014-01-11 NOTE — Clinical Social Work Note (Signed)
CSW updated facility's admission coordinator on patient's current condition. CSW will continue to assist with DC needs.   Roddie McBryant Jaedan Huttner, HugoLCSWA, KirbyLCASA, [phone]

## 2014-01-11 NOTE — Evaluation (Signed)
Physical Therapy Evaluation Patient Details Name: Savannah [name] MRN: [id number] DOB: 05/09/1919 Today's Date: 01/11/2014 Time: 5784-6962 PT Time Calculation (min): 17 min  PT Assessment / Plan / Recommendation History of Present Illness  Pt is a 80 y.o. female adm due to confusion and decreased mobility. Pt found to have PNA and CT revealed Rt MCA. Pt awaiting Pallative consult    Clinical Impression  Pt adm due to the above. Presents with significant decline in functional mobility and is total (A) for mobility/transfers at this time. Pt to benefit from skilled PT to address deficits indicated below (see PT problem list). Pt presents with cognitive deficits during session that limited her participation during evaluation today. Unsure of baseline cognition at this time; no family present. Recommend lift for OOB activity to RN at this time.     PT Assessment  Patient needs continued PT services    Follow Up Recommendations  SNF;Supervision/Assistance - 24 hour    Does the patient have the potential to tolerate intense rehabilitation      Barriers to Discharge   pt will need total care     Equipment Recommendations  None recommended by PT    Recommendations for Other Services OT consult   Frequency Min 3X/week    Precautions / Restrictions Precautions Precautions: Fall Restrictions Weight Bearing Restrictions: No   Pertinent Vitals/Pain C/o pain with mobility; unable to focalize pain or rate pain due to decreased cognition.       Mobility  Bed Mobility Overal bed mobility: Needs Assistance Bed Mobility: Supine to Sit;Sit to Supine Supine to sit: Max assist;HOB elevated Sit to supine: Max assist General bed mobility comments: Max (A) to facilitate supine to sit with use of draw pad; incr time; pt very anxious and continuously yells out "dont hurt me"; pt pushing posteriorly thoughout sitting EOB     Exercises Other Exercises Other Exercises: attempted LE exercise; limited due  to pain; pt very anxious     PT Diagnosis: Difficulty walking;Generalized weakness;Altered mental status;Acute pain  PT Problem List: Decreased strength;Decreased activity tolerance;Decreased balance;Decreased mobility;Decreased cognition;Decreased knowledge of use of DME;Decreased safety awareness;Pain;Impaired sensation PT Treatment Interventions: DME instruction;Gait training;Functional mobility training;Therapeutic activities;Therapeutic exercise;Balance training;Patient/family education;Neuromuscular re-education     PT Goals(Current goals can be found in the care plan section) Acute Rehab PT Goals Patient Stated Goal: none stated  PT Goal Formulation: With patient Time For Goal Achievement: 01/25/14 Potential to Achieve Goals: Fair  Visit Information  Last PT Received On: 01/11/14 Assistance Needed: +2 History of Present Illness: Pt is a 80 y.o. female adm due to confusion and decreased mobility. Pt found to have PNA and CT revealed Rt MCA. Pt awaiting Pallative consult         Prior Functioning  Home Living Family/patient expects to be discharged to:: Skilled nursing facility Additional Comments: From [facility]  Prior Function Level of Independence: Needs assistance Gait / Transfers Assistance Needed: pt poor historian; per note pt was ambulating with RW prior to admission  Comments: pt is poor historian; at this time would be total (A)  Communication Communication: HOH    Cognition  Cognition Arousal/Alertness: Lethargic Behavior During Therapy: Restless;Agitated Overall Cognitive Status: No family/caregiver present to determine baseline cognitive functioning Area of Impairment: Orientation;Following commands;Safety/judgement;Awareness;Problem solving Orientation Level: Disoriented to;Situation;Time;Place Memory: Decreased short-term memory Following Commands: Follows one step commands inconsistently Safety/Judgement: Decreased awareness of deficits;Decreased  awareness of safety Awareness: Emergent Problem Solving: Slow processing;Difficulty sequencing;Requires verbal cues;Requires tactile cues;Decreased initiation General Comments:  pt poor historian; answeres questions inapproriately and inconsistently     Extremity/Trunk Assessment Upper Extremity Assessment Upper Extremity Assessment: Defer to OT evaluation Lower Extremity Assessment Lower Extremity Assessment: Difficult to assess due to impaired cognition Cervical / Trunk Assessment Cervical / Trunk Assessment: Kyphotic   Balance Balance Overall balance assessment: History of Falls;Needs assistance Sitting-balance support: Feet supported;Bilateral upper extremity supported Sitting balance-Leahy Scale: Zero Sitting balance - Comments: pt pushing posteriorly thoughout sitting EOB; requires max (A) to maintain sitting position; sat EOB ~ 4 min and returned to supine; pt very anxious and fearful; continuously stated "dont hurt me dont hurt me"  Postural control: Posterior lean  End of Session PT - End of Session Equipment Utilized During Treatment: Oxygen Activity Tolerance: Treatment limited secondary to agitation;Patient limited by pain Patient left: in chair;with bed alarm set;with call bell/phone within reach Nurse Communication: Mobility status;Precautions  GP     Kidada Ging N, SoutDonell Sieverth CarolinaPT [phone] 01/11/2014, 12:51 PM

## 2014-01-11 NOTE — Consult Note (Signed)
Patient Savannah [name]      DOB: [birth year]      VOZ:[id number]     Consult Note from the Palliative Medicine Team at [facility] Requested by: Dr. Algis Liming    PCP: Blanchie Serve, MD [facility] [place]     Phone Number:[phone] Symptom management Assessment of patients Current state: 80 yr old white female admitted with altered mental status treated for Pneumonia but then found to have large frontotemporal CVA.  Patient with some improvement in dysphagia.  Met with Son [place] and Daughter Izora Gala.  Reviewed medical details and discussed goals of care.  They understand that in her current condition that it may be only a matter of time before she succumbs to her multiple issues.  They are trying to be realistic about that journey and are excepting that she could become sick enough to die at anytime. For now they would like to treat the treatable and review her status in next 48 hours. Their obvious hope would be that she could recover to some level of meaningful life and return to [facility] where her 36 yr old sister is living as well. We  Talked about the what-ifs and were able to review how hospice could be involved either at [facility] or at their own facility.   Goals of Care: 1.  Code Status: DNR   2. Scope of Treatment: Continue to treat the treatable while maintaining her as comfortably as she can be maintained. Reeval in 24-48 hours.  4. Disposition: to be determined   3. Symptom Management:   1. Pain: Currently using tylenol can consider opiate if needed but will increase risk for delirium 2. Bowel Regimen:senna added, will consider suppository if not effective 3. Delirium: high risk , monitor.  Asked family to consider leaving hearing aid to help with reorientation and communication 4. Fever: tylenol  4. Psychosocial: [name] lived on her own until the last year when she moved to [place]  Her son states she fell in love their with the place and people but  maintains a home in [place] that she visits on occasion when she is out and about for doctors visits.  She was fiercely independent all her life. Prior to admission she was walking with a walker and able to assist with toileting.  5. Spiritual: Christian faith.  Offered [employer].        Patient Documents Completed or Given: Document Given Completed  Advanced Directives Pkt    MOST    DNR    Gone from My Sight    Hard Choices      Brief HPI: 80 yr old white female admitted with change in mental status felt to be secondary to pneumonia.  Was treated with Cefepime but when she still did not improve CT head was obtained showing large right frontotemoporal stroke. We were asked to assist with goals of care.   ROS:  Headache, has not moved bowels.    PMH:  Past Medical History  Diagnosis Date  . Hypertension   . Hypercholesteremia   . Pelvis fracture   . Kidney stones   . Hyperglycemia   . Diabetes mellitus without complication   . CVA (cerebral infarction)   . Hypoxia      PSH: Past Surgical History  Procedure Laterality Date  . Abdominal hysterectomy     I have reviewed the FH and SH and  If appropriate update it with new information. No Known Allergies Scheduled  Meds: . antiseptic oral rinse  15 mL Mouth Rinse BID  . ceFEPime (MAXIPIME) IV  1 g Intravenous Q12H  . Chlorhexidine Gluconate Cloth  6 each Topical Q0600  . clopidogrel  75 mg Oral Daily  . enoxaparin (LOVENOX) injection  30 mg Subcutaneous QHS  . feeding supplement (ENSURE)  1 Container Oral TID BM  . hydrALAZINE  10 mg Intravenous Once  . insulin aspart  0-5 Units Subcutaneous QHS  . insulin aspart  0-9 Units Subcutaneous TID WC  . metoprolol tartrate  25 mg Oral BID  . mupirocin ointment  1 application Nasal BID  . pantoprazole sodium  40 mg Oral Daily  . senna  2 tablet Oral Daily  . sodium chloride  3 mL Intravenous Q12H   Continuous Infusions:  PRN Meds:.acetaminophen, food  thickener, ipratropium-albuterol, sodium chloride    BP 179/81  Pulse 79  Temp(Src) 97.3 F (36.3 C) (Oral)  Resp 20  Ht $R'5\' 5"'dL$  (1.651 m)  Wt 78.2 kg (172 lb 6.4 oz)  BMI 28.69 kg/m2  SpO2 92%   PPS: 20%   Intake/Output Summary (Last 24 hours) at 01/11/14 2105 Last data filed at 01/11/14 1911  Gross per 24 hour  Intake    458 ml  Output    400 ml  Net     58 ml   LBM: 2/26                   Physical Exam:  General:  Awake , alert very hard of hearing, pleasant HEENT:  Pupils equal, but left lateral strabismus present Chest:   Decreased with some upper airway crackles, patient has history of vocalizing with respiration per family CVS: irregular, s1, s2 Abdomen:obese, soft not tender minimally distended Ext: warm, edema all extremities not pitting Neuro:oriented to her self and her children, jokes with you and confabulates answers.  Decreased strength in left arm  Labs: CBC    Component Value Date/Time   WBC 7.9 01/11/2014 0615   WBC 5.7 09/12/2010 1031   WBC 8.8 12/22/2007 0856   RBC 4.67 01/11/2014 0615   RBC 4.95 09/12/2010 1031   RBC 4.76 12/22/2007 0856   HGB 11.5* 01/11/2014 0615   HGB 14.3 09/12/2010 1031   HGB 13.5 12/22/2007 0856   HCT 38.8 01/11/2014 0615   HCT 43.3 09/12/2010 1031   HCT 39.8 12/22/2007 0856   PLT 317 01/11/2014 0615   PLT 231 09/12/2010 1031   PLT 285 12/22/2007 0856   MCV 83.1 01/11/2014 0615   MCV 85 09/12/2010 1031   MCV 83.5 12/22/2007 0856   MCH 24.6* 01/11/2014 0615   MCH 28.0 09/12/2010 1031   MCH 28.3 12/22/2007 0856   MCHC 29.6* 01/11/2014 0615   MCHC 32.9 09/12/2010 1031   MCHC 33.9 12/22/2007 0856   RDW 16.2* 01/11/2014 0615   RDW 11.8 09/12/2010 1031   RDW 15.2* 12/22/2007 0856   LYMPHSABS 1.6 01/05/2014 0936   LYMPHSABS 1.6 09/12/2010 1031   LYMPHSABS 1.6 12/22/2007 0856   MONOABS 1.3* 01/05/2014 0936   MONOABS 0.8 12/22/2007 0856   EOSABS 0.0 01/05/2014 0936   EOSABS 0.0 09/12/2010 1031   EOSABS 0.0 12/22/2007 0856   BASOSABS 0.0 01/05/2014 0936    BASOSABS 0.0 09/12/2010 1031   BASOSABS 0.0 12/22/2007 0856       CMP     Component Value Date/Time   NA 145 01/11/2014 0615   K 3.6* 01/11/2014 0615   CL 99 01/11/2014 0615  CO2 37* 01/11/2014 0615   GLUCOSE 110* 01/11/2014 0615   BUN 26* 01/11/2014 0615   CREATININE 0.76 01/11/2014 0615   CALCIUM 9.2 01/11/2014 0615   PROT 6.6 01/05/2014 0936   ALBUMIN 2.5* 01/05/2014 0936   AST 19 01/05/2014 0936   ALT 11 01/05/2014 0936   ALKPHOS 63 01/05/2014 0936   BILITOT 0.5 01/05/2014 0936   GFRNONAA 71* 01/11/2014 0615   GFRAA 82* 01/11/2014 0615    Chest Xray Reviewed/Impressions:  Progression of bibasilar atelectasis/infiltrate and bilateral  effusions. Mild fluid overload also possible   CT scan of the Head Reviewed/Impressions:Findings consistent with larger right MCA territory infarction.  Mild mass effect upon the right ventricle.  No evidence of hemorrhage     Time In Time Out Total Time Spent with Patient Total Overall Time  340 pm 500 pm 20 min 80 min    Greater than 50%  of this time was spent counseling and coordinating care related to the above assessment and plan.   Lakeia Bradshaw L. Lovena Le, MD MBA The Palliative Medicine Team at [facility] Phone: [phone] Pager: [phone]

## 2014-01-11 NOTE — Consult Note (Signed)
Patient XY:BFXO R Primo      DOB: Aug 07, 1919      VAN:[id number]  Summary of Goals of Care; Full note to follow:  Met with daughter Izora Gala and Sherryll Burger   Patient not fully able to participate due to hard of hearing even with hearing aid.   Reviewed all the details of current admission with family .  They understand that Rayette is at high risk for recurrent stroke and death in near future.  They see that she is declining and while their goals would be to have her able to assist with toileting and walk with her walker they understand that might not happen.  They are torn about her feeding issue.  At this time they want to comfort feed the recommended texture.  They have taken a Hard Choices Book to review information about feeding tubes.  They would like to continue to treat the treatable for now and see how she does with PT and SLP.  They are comfortable with her care at [facility] where she rooms with her sister who is 21.  We did talk some about hospice facility use if she declines or is further compromised .  They agree to respeak in the am.   Total time:  340 pm-500 pm  Jaleia Hanke L. Lovena Le, MD MBA The Palliative Medicine Team at [facility] Phone: [phone] Pager: [phone]

## 2014-01-12 ENCOUNTER — Inpatient Hospital Stay (HOSPITAL_COMMUNITY): Payer: PRIVATE HEALTH INSURANCE

## 2014-01-12 DIAGNOSIS — E1165 Type 2 diabetes mellitus with hyperglycemia: Secondary | ICD-10-CM

## 2014-01-12 DIAGNOSIS — I6350 Cerebral infarction due to unspecified occlusion or stenosis of unspecified cerebral artery: Secondary | ICD-10-CM

## 2014-01-12 DIAGNOSIS — R1314 Dysphagia, pharyngoesophageal phase: Secondary | ICD-10-CM

## 2014-01-12 DIAGNOSIS — IMO0001 Reserved for inherently not codable concepts without codable children: Secondary | ICD-10-CM

## 2014-01-12 DIAGNOSIS — E870 Hyperosmolality and hypernatremia: Secondary | ICD-10-CM

## 2014-01-12 LAB — BASIC METABOLIC PANEL
BUN: 22 mg/dL (ref 6–23)
CALCIUM: 9.4 mg/dL (ref 8.4–10.5)
CHLORIDE: 102 meq/L (ref 96–112)
CO2: 38 mEq/L — ABNORMAL HIGH (ref 19–32)
CREATININE: 0.72 mg/dL (ref 0.50–1.10)
GFR calc non Af Amer: 72 mL/min — ABNORMAL LOW (ref >90)
GFR, EST AFRICAN AMERICAN: 84 mL/min — AB (ref >90)
Glucose, Bld: 142 mg/dL — ABNORMAL HIGH (ref 70–99)
Potassium: 4.1 mEq/L (ref 3.7–5.3)
SODIUM: 150 meq/L — AB (ref 137–147)

## 2014-01-12 LAB — GLUCOSE, CAPILLARY
GLUCOSE-CAPILLARY: 128 mg/dL — AB (ref 70–99)
GLUCOSE-CAPILLARY: 194 mg/dL — AB (ref 70–99)
GLUCOSE-CAPILLARY: 157 mg/dL — AB (ref 70–99)
GLUCOSE-CAPILLARY: 182 mg/dL — AB (ref 70–99)

## 2014-01-12 MED ORDER — ACETAMINOPHEN 650 MG RE SUPP
650.0000 mg | RECTAL | Status: DC | PRN
  Administered 2014-01-12: 650 mg via RECTAL
  Filled 2014-01-12: qty 1

## 2014-01-12 MED ORDER — BISACODYL 10 MG RE SUPP
10.0000 mg | Freq: Every day | RECTAL | Status: DC | PRN
  Administered 2014-01-12: 10 mg via RECTAL
  Filled 2014-01-12: qty 1

## 2014-01-12 MED ORDER — DEXTROSE 5 % IV SOLN
INTRAVENOUS | Status: AC
  Administered 2014-01-12: 15:00:00 via INTRAVENOUS

## 2014-01-12 NOTE — Progress Notes (Signed)
[facility] Progress Note  TEAM 1 transfer   ASJA FROMMER ZOX:[id number] DOB: 10-31-1919 DOA: 01/05/2014 PCP: Oneal Grout, MD  Brief narrative: Savannah [name] is a 80 y.o. female presenting on 01/05/2014 with  has a past medical history of Hypertension; Hypercholesteremia; Pelvis fracture; Kidney stones; Diabetes mellitus, CVA (cerebral infarction) who presented from [name] long term SNF. She was brought in after being found confused and less responsive. For the last several weeks, patient has been getting weaker per daughter- unable to use walker to get to the bathroom. She did endorse SOB and cough. Very hard of hearing, She was diagnosed with PNA in the ER and noted to have Hypotension, A-fib with RVR and admitted to SDU, required Cardizem gtt initially. SHe was treated with IV antibiotics and then developed volume overload and was diuresed briefly while in SDU and transferred to Floor 3/2.  She had swallow eval, started on diet, but continued to be lethargic off and on. CT head which showed R MCA infarct, Dr. Jomarie Longs d/w Neurology and started Plavix. Awaiting palliative care evaluation.  Subjective: "I'm a sick lady". "I have Pneumonia". Denies dyspnea or CP.  Assessment/Plan:    Acute respiratory failure - likely due to CHF from uncontrolled HR and diastolic dysfunction and HCAP Vs Aspiration PNA  - Stopped Vanc, continue Cefepime- end 3/5 to complete total 8 days Rx. - SLP following, s/p MBS with moderate dysphagia, now on D1 diet with nectar thick liquids - was diuresed earlier in admission - held PO lasix since euvolemic now  HCAP Vs Aspiration PNA - Management as above. Complete antibiotic on 3/5.  Hypernatremia - Likely secondary to intravascular volume depletion from poor oral intake. Brief IV fluids    P.Afibrillation - newly diagnosed and converted back to NSR - ECHO completed- see below - not a candidate for anticoagulation , due to advanced age/fall risk -  will cont ASA 81mg  - cont Metoprolol Po - off cardizem gtt   Lethargy - Probably multifactorial secondary to acute illness, mild hypernatremia complicating advanced age and dementia.     Diastolic CHF, acute on chronic - ECHO reveals LVH- diuresed as above, now euvolemic - hold PO lasix. Clinically dehydrated.    HTN - cont Metoprolol PO    DM -Type II or unspecified type diabetes mellitus without mention of complication, uncontrolled   Dementia   Nutrition  - Continue current dysphagia 1 diet   Dysphagia  -on D1 diet as noted above  -moderate dysphagia  Ethics: due to advanced age, dementia, dysphagia and acute illness Dr. Jomarie Longs requested Palliative consult for Goals of care-input appreciated and discussed with Dr. Ladona Ridgel on 3/5: Family aware of overall poor status, ongoing decline. They would like to continue to treat the treatable and reassess in a day or 2.  Right MCA infarct - Dr. Jomarie Longs discussed with neurology on 3/3: Avoid anticoagulation due to fall risk and age. Advised to DC aspirin and start Plavix. No further workup planned because it will not change management at her age. Also palliative care team to evaluate.  Anemia - Stable  Constipation - Start Bowel regimen.   DVT proph: lovenox  Code Status: DNR Family Communication: no family at bedside now Disposition Plan: To be determined  Consultants: Palliative care team  Procedures: none  Antibiotics: Antibiotics Given (last 72 hours)   Date/Time Action Medication Dose Rate   01/09/14 2107 Given   ceFEPIme (MAXIPIME) 1 g in dextrose 5 % 50 mL IVPB 1 g  100 mL/hr   01/10/14 0947 Given   ceFEPIme (MAXIPIME) 1 g in dextrose 5 % 50 mL IVPB 1 g 100 mL/hr   01/10/14 2217 Given   ceFEPIme (MAXIPIME) 1 g in dextrose 5 % 50 mL IVPB 1 g 100 mL/hr   01/11/14 1610 Given   ceFEPIme (MAXIPIME) 1 g in dextrose 5 % 50 mL IVPB 1 g 100 mL/hr   01/11/14 2316 Given   ceFEPIme (MAXIPIME) 1 g in dextrose 5 % 50 mL  IVPB 1 g 100 mL/hr   01/12/14 1107 Given   ceFEPIme (MAXIPIME) 1 g in dextrose 5 % 50 mL IVPB 1 g 100 mL/hr       DVT prophylaxis: Lovenox  Objective: Filed Weights   01/10/14 0351 01/11/14 0640 01/12/14 0620  Weight: 78.4 kg (172 lb 13.5 oz) 78.2 kg (172 lb 6.4 oz) 79.1 kg (174 lb 6.1 oz)   Blood pressure 137/66, pulse 80, temperature 98.6 F (37 C), temperature source Axillary, resp. rate 16, height 5\' 5"  (1.651 m), weight 79.1 kg (174 lb 6.1 oz), SpO2 95.00%.  Intake/Output Summary (Last 24 hours) at 01/12/14 1406 Last data filed at 01/12/14 0858  Gross per 24 hour  Intake    368 ml  Output    550 ml  Net   -182 ml     Exam: General: Pleasant alert and oriented to self-looks better than she did yesterday, No acute respiratory distress Lungs: Slightly diminished breath sounds in the bases with few scattered bilateral crackles and occasional rhonchi. No increased work of breathing  Cardiovascular: Regular rate and rhythm without murmur gallop or rub normal S1 and S2.  Abdomen: Nontender, nondistended, soft, bowel sounds positive, no rebound, no ascites, no appreciable mass Extremities: No significant cyanosis, clubbing, or edema bilateral lower extremities Neuro: Alert and oriented only to self. No obvious focal deficits  Data Reviewed: Basic Metabolic Panel:  Recent Labs Lab 01/07/14 1110 01/08/14 0900 01/10/14 0429 01/11/14 0615 01/12/14 0438  NA 142 145 148* 145 150*  K 3.5* 3.7 3.1* 3.6* 4.1  CL 93* 96 100 99 102  CO2 31 34* 38* 37* 38*  GLUCOSE 130* 117* 117* 110* 142*  BUN 12 17 26* 26* 22  CREATININE 0.82 0.80 0.82 0.76 0.72  CALCIUM 8.4 8.7 9.1 9.2 9.4   Liver Function Tests: No results found for this basename: AST, ALT, ALKPHOS, BILITOT, PROT, ALBUMIN,  in the last 168 hours No results found for this basename: LIPASE, AMYLASE,  in the last 168 hours No results found for this basename: AMMONIA,  in the last 168 hours CBC:  Recent Labs Lab  01/10/14 0429 01/11/14 0615  WBC 7.6 7.9  HGB 11.4* 11.5*  HCT 38.5 38.8  MCV 82.4 83.1  PLT 333 317   Cardiac Enzymes: No results found for this basename: CKTOTAL, CKMB, CKMBINDEX, TROPONINI,  in the last 168 hours BNP (last 3 results)  Recent Labs  08/23/13 2224  PROBNP 184.6   CBG:  Recent Labs Lab 01/11/14 1204 01/11/14 1713 01/11/14 2211 01/12/14 0810 01/12/14 1136  GLUCAP 172* 142* 163* 128* 194*    Recent Results (from the past 240 hour(s))  URINE CULTURE     Status: None   Collection Time    01/05/14  9:43 AM      Result Value Ref Range Status   Specimen Description URINE, RANDOM   Final   Special Requests NONE   Final   Culture  Setup Time     Final  Value: 01/05/2014 14:52     Performed at [facility] Count     Final   Value: NO GROWTH     Performed at [facility]   Culture     Final   Value: NO GROWTH     Performed at [facility]   Report Status 01/06/2014 FINAL   Final  CULTURE, BLOOD (ROUTINE X 2)     Status: None   Collection Time    01/05/14 10:14 AM      Result Value Ref Range Status   Specimen Description BLOOD RIGHT ANTECUBITAL   Final   Special Requests BOTTLES DRAWN AEROBIC AND ANAEROBIC 10CCS   Final   Culture  Setup Time     Final   Value: 01/05/2014 14:37     Performed at [facility]   Culture     Final   Value: NO GROWTH 5 DAYS     Performed at [facility]   Report Status 01/11/2014 FINAL   Final  CULTURE, BLOOD (ROUTINE X 2)     Status: None   Collection Time    01/05/14 10:26 AM      Result Value Ref Range Status   Specimen Description BLOOD LEFT ANTECUBITAL   Final   Special Requests BOTTLES DRAWN AEROBIC AND ANAEROBIC 5CCS   Final   Culture  Setup Time     Final   Value: 01/05/2014 14:37     Performed at [facility]   Culture     Final   Value: NO GROWTH 5 DAYS     Performed at [facility]   Report Status 01/11/2014 FINAL   Final  MRSA PCR  SCREENING     Status: Abnormal   Collection Time    01/05/14  1:47 PM      Result Value Ref Range Status   MRSA by PCR POSITIVE (*) NEGATIVE Final   Comment:            The GeneXpert MRSA Assay (FDA     approved for NASAL specimens     only), is one component of a     comprehensive MRSA colonization     surveillance program. It is not     intended to diagnose MRSA     infection nor to guide or     monitor treatment for     MRSA infections.     RESULT CALLED TO, READ BACK BY AND VERIFIED WITH:     [facility] RN 16:40 01/05/14 ([email or web address])     Studies:  Recent x-ray studies have been reviewed in detail by the Attending Physician  Scheduled Meds:  Scheduled Meds: . antiseptic oral rinse  15 mL Mouth Rinse BID  . ceFEPime (MAXIPIME) IV  1 g Intravenous Q12H  . Chlorhexidine Gluconate Cloth  6 each Topical Q0600  . clopidogrel  75 mg Oral Daily  . enoxaparin (LOVENOX) injection  30 mg Subcutaneous QHS  . feeding supplement (ENSURE)  1 Container Oral TID BM  . hydrALAZINE  10 mg Intravenous Once  . insulin aspart  0-5 Units Subcutaneous QHS  . insulin aspart  0-9 Units Subcutaneous TID WC  . metoprolol tartrate  25 mg Oral BID  . mupirocin ointment  1 application Nasal BID  . pantoprazole sodium  40 mg Oral Daily  . senna  2 tablet Oral Daily  . sodium chloride  3 mL Intravenous Q12H   Continuous Infusions:    Time  spent on care of this patient: 25 min   Tacoma General HospitalNGALGI,Sufyaan Palma, MD [phone]  [facility] Office  [phone] Pager - Text Page per Amion as per below:  On-Call/Text Page:      [email or web address]      password [email or web address]  If 7PM-7AM, please contact night-coverage [email or web address] Password [email or web address] 01/12/2014, 2:06 PM   LOS: 7 days

## 2014-01-13 LAB — GLUCOSE, CAPILLARY
GLUCOSE-CAPILLARY: 118 mg/dL — AB (ref 70–99)
Glucose-Capillary: 162 mg/dL — ABNORMAL HIGH (ref 70–99)
Glucose-Capillary: 116 mg/dL — ABNORMAL HIGH (ref 70–99)
Glucose-Capillary: 148 mg/dL — ABNORMAL HIGH (ref 70–99)

## 2014-01-13 LAB — BASIC METABOLIC PANEL
BUN: 21 mg/dL (ref 6–23)
CALCIUM: 9.1 mg/dL (ref 8.4–10.5)
CO2: 39 meq/L — AB (ref 19–32)
Chloride: 101 mEq/L (ref 96–112)
Creatinine, Ser: 0.69 mg/dL (ref 0.50–1.10)
GFR calc Af Amer: 85 mL/min — ABNORMAL LOW (ref >90)
GFR, EST NON AFRICAN AMERICAN: 73 mL/min — AB (ref >90)
Glucose, Bld: 130 mg/dL — ABNORMAL HIGH (ref 70–99)
POTASSIUM: 4 meq/L (ref 3.7–5.3)
SODIUM: 148 meq/L — AB (ref 137–147)

## 2014-01-13 MED ORDER — NITROGLYCERIN 0.4 MG SL SUBL
SUBLINGUAL_TABLET | SUBLINGUAL | Status: AC
  Filled 2014-01-13: qty 1

## 2014-01-13 MED ORDER — DEXTROSE 5 % IV SOLN
INTRAVENOUS | Status: AC
  Administered 2014-01-13: 23:00:00 via INTRAVENOUS

## 2014-01-13 MED ORDER — MORPHINE SULFATE (CONCENTRATE) 10 MG /0.5 ML PO SOLN
5.0000 mg | ORAL | Status: DC | PRN
  Administered 2014-01-15: 5 mg via ORAL

## 2014-01-13 MED ORDER — NITROGLYCERIN 0.4 MG SL SUBL
0.4000 mg | SUBLINGUAL_TABLET | SUBLINGUAL | Status: DC | PRN
  Administered 2014-01-13 – 2014-01-15 (×6): 0.4 mg via SUBLINGUAL
  Filled 2014-01-13 (×3): qty 1

## 2014-01-13 NOTE — Progress Notes (Signed)
Patient ZO:XWRU:Savannah [name]      DOB: 04/01/1919      EAV:[id number]   [facility]    Subjective:  Savannah [name] is not as good as yesterday.  Eyes closed  Complaining of headache. No bowel movement as of yet.  Respirations intermittently irregular   Filed Vitals:   01/12/14 0448  BP: 175/81  Pulse: 77  Temp: 97.8 F (36.6 C)  Resp: 20   Physical exam:  General: not as interactive as yesterday, won't open eyes for me Pupils examined equal, strabismus of left eye looking lateral Chest : some course rhonchi,no wheezing NWG:NFAOZHYQMCVS:irregular, S1, S2 Abd: obese, not tender mildly distended Ext: warm, trace edema not pitting Neuro: hard of hearing , not as interactive  Na 150, Cr .72   There has been mild interval improvement in the appearance of the  pulmonary interstitium bilaterally. There remains atelectasis or  pneumonia at the left lung base. The right lung base is clearer  today  Assessment and plan: 80 yr old white female admitted with lethargy diagnosed with pneumonia for which she received treatment. Course complicated by continued altered mental status. CT obtained shows large frontal./temporal stroke with some edema.  Family understands that she may not survive but they asked to treat the treatable and observe. Willing to reassess in am for possible comfort care.  1.  DNR  NOT comfort care at this time  2. Altered mental status with headache : could be due to sodium increase or extension of stroke.  CT not indicated.  If persists consider dc anticoagulation.  3.Dysphagia: continue modified diet and SL monitoring  4.  Hypernatremia- per TRH  5.  Mild interstitial edema- per TRH   Total time 15 min  discussed with Son Savannah [name] and left message for daughter.   Savannah Ridolfi L. Ladona Ridgelaylor, MD MBA The Palliative Medicine Team at [facility] Team Phone: [phone] Pager: [phone]

## 2014-01-13 NOTE — Progress Notes (Signed)
Patient ZO:XWRU R Hanrahan      DOB: 01/28/1919      EAV:[id number]   Palliative Medicine Team at [facility] Progress Note    Subjective:  Met with family at the bedside.  They see some improvement since yesterday even if its small.  They would like to continue to treat the treatable.    Filed Vitals:   01/13/14 1358  BP: 138/70  Pulse: 67  Temp: 97.9 F (36.6 C)  Resp: 22   Physical exam:    General: more awake and alert , speech slightly clearer Perrl, left strabismus Chest : less rhochi, maintains nervous vocalizations especially in sleep CVS : irregular, S1,S2 Abd obese, less distended although still focused on having a bowel movement Ext: warm, moving all extremities better today still weak on the left Neuro: weak on the left, left strabismus  Lab Results  Component Value Date   CREATININE 0.69 01/13/2014   BUN 21 01/13/2014   NA 148* 01/13/2014   K 4.0 01/13/2014   CL 101 01/13/2014   CO2 39* 01/13/2014   Lab Results  Component Value Date   WBC 7.9 01/11/2014   HGB 11.5* 01/11/2014   HCT 38.8 01/11/2014   MCV 83.1 01/11/2014   PLT 317 01/11/2014     Assessment and plan: 80 yr old white female s/p treatment for pneumonia and new frontal CVA. Family would like to continue curative treatment for now.   1. DNR NOT comfort care at this time  2. Altered mental status with headache : could be due to sodium increase or extension of stroke. CT not indicated. If persists consider dc anticoagulation.  3.Dysphagia: continue modified diet and SL monitoring  4. Hypernatremia- per TRH  5. Mild interstitial edema- per TRH   Time total:  310 - [place] Lovena Le, MD MBA The Palliative Medicine Team at [facility] Phone: [phone] Pager: [phone]

## 2014-01-13 NOTE — Progress Notes (Signed)
[facility] Progress Note  TEAM 1 transfer   Savannah [name] ZOX:[id number] DOB: 1918-12-16 DOA: 01/05/2014 PCP: Oneal Grout, MD  Brief narrative: Savannah [name] is a 80 y.o. female presenting on 01/05/2014 with  has a past medical history of Hypertension; Hypercholesteremia; Pelvis fracture; Kidney stones; Diabetes mellitus, CVA (cerebral infarction) who presented from [name] long term [facility]. She was brought in after being found confused and less responsive. For the last several weeks, patient has been getting weaker per daughter- unable to use walker to get to the bathroom. She did endorse SOB and cough. Very hard of hearing, She was diagnosed with PNA in the ER and noted to have Hypotension, A-fib with RVR and admitted to SDU, required Cardizem gtt initially. SHe was treated with IV antibiotics and then developed volume overload and was diuresed briefly while in SDU and transferred to Floor 3/2.  She had swallow eval, started on diet, but continued to be lethargic off and on. CT head which showed R MCA infarct, Dr. Jomarie Longs d/w Neurology and started Plavix. Awaiting palliative care evaluation.  Subjective: Patient complained of some chest pain this morning but not clear if she was referring to her chest pain or dyspnea-hard of hearing and confused.  Assessment/Plan:    Acute respiratory failure - likely due to CHF from uncontrolled HR and diastolic dysfunction and HCAP Vs Aspiration PNA  - Stopped Vanc, completed 8 days of cefepime. - SLP following, s/p MBS with moderate dysphagia, now on D1 diet with nectar thick liquids - was diuresed earlier in admission - held PO lasix since euvolemic now  HCAP Vs Aspiration PNA - Management as above. Complete antibiotics..  Hypernatremia - Likely secondary to intravascular volume depletion from poor oral intake. Slightly better after brief IV fluids-will continue.    P.Afibrillation - newly diagnosed and converted back to NSR - ECHO  completed- see below - not a candidate for anticoagulation , due to advanced age/fall risk - will cont ASA 81mg  - cont Metoprolol Po - off cardizem gtt   Lethargy - Probably multifactorial secondary to acute illness, mild hypernatremia complicating advanced age and dementia. Seems slightly better today.    Diastolic CHF, acute on chronic - ECHO reveals LVH- diuresed as above, now euvolemic - hold PO lasix. Clinically dehydrated.    HTN - cont Metoprolol PO    DM -Type II or unspecified type diabetes mellitus without mention of complication, uncontrolled   Dementia   Nutrition  - Continue current dysphagia 1 diet   Dysphagia  -on D1 diet as noted above  -moderate dysphagia  Ethics: due to advanced age, dementia, dysphagia and acute illness Dr. Jomarie Longs requested Palliative consult for Goals of care-input appreciated and discussed with Dr. Ladona Ridgel on 3/5: Family aware of overall poor status, ongoing decline. They would like to continue to treat the treatable and reassess in a day or 2.  Right MCA infarct - Dr. Jomarie Longs discussed with neurology on 3/3: Avoid anticoagulation due to fall risk and age. Advised to DC aspirin and start Plavix. No further workup planned because it will not change management at her age. Also palliative care team to evaluate. Due to new-onset headache and worsening mental status yesterday, concern for possible IC hemorrhage-did not get CT because would not change management but Plavix and DVT prophylaxis heparin held.  Anemia - Stable  Constipation - Start Bowel regimen.   Chest pain - Patient's son states that she's been having this on and off. EKG without acute changes.  No aggressive workup. When necessary NTG and Roxanol added.  DVT proph: lovenox  Code Status: DNR Family Communication: no family at bedside now Disposition Plan: To be determined  Consultants: Palliative care team  Procedures: none  Antibiotics: Antibiotics Given (last 72  hours)   Date/Time Action Medication Dose Rate   01/10/14 2217 Given   ceFEPIme (MAXIPIME) 1 g in dextrose 5 % 50 mL IVPB 1 g 100 mL/hr   01/11/14 1610 Given   ceFEPIme (MAXIPIME) 1 g in dextrose 5 % 50 mL IVPB 1 g 100 mL/hr   01/11/14 2316 Given   ceFEPIme (MAXIPIME) 1 g in dextrose 5 % 50 mL IVPB 1 g 100 mL/hr   01/12/14 1107 Given   ceFEPIme (MAXIPIME) 1 g in dextrose 5 % 50 mL IVPB 1 g 100 mL/hr   01/12/14 2100 Given   ceFEPIme (MAXIPIME) 1 g in dextrose 5 % 50 mL IVPB 1 g 100 mL/hr       DVT prophylaxis: Lovenox  Objective: Filed Weights   01/11/14 0640 01/12/14 0620 01/13/14 0448  Weight: 78.2 kg (172 lb 6.4 oz) 79.1 kg (174 lb 6.1 oz) 79 kg (174 lb 2.6 oz)   Blood pressure 138/70, pulse 67, temperature 97.9 F (36.6 C), temperature source Oral, resp. rate 22, height 5\' 5"  (1.651 m), weight 79 kg (174 lb 2.6 oz), SpO2 96.00%.  Intake/Output Summary (Last 24 hours) at 01/13/14 1744 Last data filed at 01/13/14 1358  Gross per 24 hour  Intake    100 ml  Output    100 ml  Net      0 ml     Exam: General: Looks slightly uncomfortable but in no respiratory distress Lungs: Slightly diminished breath sounds in the bases with few scattered bilateral crackles and occasional rhonchi. No increased work of breathing  Cardiovascular: Regular rate and rhythm without murmur gallop or rub normal S1 and S2.  Abdomen: Nontender, nondistended, soft, bowel sounds positive, no rebound, no ascites, no appreciable mass Extremities: No significant cyanosis, clubbing, or edema bilateral lower extremities Neuro: Alert and oriented only to self. No obvious focal deficits  Data Reviewed: Basic Metabolic Panel:  Recent Labs Lab 01/08/14 0900 01/10/14 0429 01/11/14 0615 01/12/14 0438 01/13/14 0620  NA 145 148* 145 150* 148*  K 3.7 3.1* 3.6* 4.1 4.0  CL 96 100 99 102 101  CO2 34* 38* 37* 38* 39*  GLUCOSE 117* 117* 110* 142* 130*  BUN 17 26* 26* 22 21  CREATININE 0.80 0.82 0.76 0.72  0.69  CALCIUM 8.7 9.1 9.2 9.4 9.1   Liver Function Tests: No results found for this basename: AST, ALT, ALKPHOS, BILITOT, PROT, ALBUMIN,  in the last 168 hours No results found for this basename: LIPASE, AMYLASE,  in the last 168 hours No results found for this basename: AMMONIA,  in the last 168 hours CBC:  Recent Labs Lab 01/10/14 0429 01/11/14 0615  WBC 7.6 7.9  HGB 11.4* 11.5*  HCT 38.5 38.8  MCV 82.4 83.1  PLT 333 317   Cardiac Enzymes: No results found for this basename: CKTOTAL, CKMB, CKMBINDEX, TROPONINI,  in the last 168 hours BNP (last 3 results)  Recent Labs  08/23/13 2224  PROBNP 184.6   CBG:  Recent Labs Lab 01/12/14 1136 01/12/14 1757 01/12/14 2121 01/13/14 0836 01/13/14 1203  GLUCAP 194* 157* 182* 118* 162*    Recent Results (from the past 240 hour(s))  URINE CULTURE     Status: None   Collection Time  01/05/14  9:43 AM      Result Value Ref Range Status   Specimen Description URINE, RANDOM   Final   Special Requests NONE   Final   Culture  Setup Time     Final   Value: 01/05/2014 14:52     Performed at [facility]   Colony Count     Final   Value: NO GROWTH     Performed at [facility]   Culture     Final   Value: NO GROWTH     Performed at [facility]   Report Status 01/06/2014 FINAL   Final  CULTURE, BLOOD (ROUTINE X 2)     Status: None   Collection Time    01/05/14 10:14 AM      Result Value Ref Range Status   Specimen Description BLOOD RIGHT ANTECUBITAL   Final   Special Requests BOTTLES DRAWN AEROBIC AND ANAEROBIC 10CCS   Final   Culture  Setup Time     Final   Value: 01/05/2014 14:37     Performed at [facility]   Culture     Final   Value: NO GROWTH 5 DAYS     Performed at [facility]   Report Status 01/11/2014 FINAL   Final  CULTURE, BLOOD (ROUTINE X 2)     Status: None   Collection Time    01/05/14 10:26 AM      Result Value Ref Range Status   Specimen Description BLOOD  LEFT ANTECUBITAL   Final   Special Requests BOTTLES DRAWN AEROBIC AND ANAEROBIC 5CCS   Final   Culture  Setup Time     Final   Value: 01/05/2014 14:37     Performed at [facility]   Culture     Final   Value: NO GROWTH 5 DAYS     Performed at [facility]   Report Status 01/11/2014 FINAL   Final  MRSA PCR SCREENING     Status: Abnormal   Collection Time    01/05/14  1:47 PM      Result Value Ref Range Status   MRSA by PCR POSITIVE (*) NEGATIVE Final   Comment:            The GeneXpert MRSA Assay (FDA     approved for NASAL specimens     only), is one component of a     comprehensive MRSA colonization     surveillance program. It is not     intended to diagnose MRSA     infection nor to guide or     monitor treatment for     MRSA infections.     RESULT CALLED TO, READ BACK BY AND VERIFIED WITH:     [facility] RN 16:40 01/05/14 ([email or web address])     Studies:  Recent x-ray studies have been reviewed in detail by the Attending Physician  Scheduled Meds:  Scheduled Meds: . antiseptic oral rinse  15 mL Mouth Rinse BID  . feeding supplement (ENSURE)  1 Container Oral TID BM  . hydrALAZINE  10 mg Intravenous Once  . insulin aspart  0-5 Units Subcutaneous QHS  . insulin aspart  0-9 Units Subcutaneous TID WC  . metoprolol tartrate  25 mg Oral BID  . mupirocin ointment  1 application Nasal BID  . pantoprazole sodium  40 mg Oral Daily  . senna  2 tablet Oral Daily  . sodium chloride  3 mL Intravenous Q12H  Continuous Infusions:    Time spent on care of this patient: 25 min   [facility],Criss Pallone, MD [phone]  [facility] Office  [phone] Pager - Text Page per Amion as per below:  On-Call/Text Page:      [email or web address]      password [email or web address]  If 7PM-7AM, please contact night-coverage [email or web address] Password [email or web address] 01/13/2014, 5:44 PM   LOS: 8 days

## 2014-01-13 NOTE — Progress Notes (Signed)
PT Cancellation Note  Patient Details Name: Savannah [name] MRN: [id number] DOB: 08/23/1919   Cancelled Treatment:    Reason Eval/Treat Not Completed: Other (comment) (Will defer until after meeting with palliative care to determine direction of care.)   [name] 01/13/2014, 1:55 PM

## 2014-01-14 LAB — BASIC METABOLIC PANEL WITH GFR
BUN: 21 mg/dL (ref 6–23)
CO2: 39 meq/L — ABNORMAL HIGH (ref 19–32)
Calcium: 9.3 mg/dL (ref 8.4–10.5)
Chloride: 101 meq/L (ref 96–112)
Creatinine, Ser: 0.65 mg/dL (ref 0.50–1.10)
GFR calc Af Amer: 87 mL/min — ABNORMAL LOW (ref >90)
GFR calc non Af Amer: 75 mL/min — ABNORMAL LOW (ref >90)
Glucose, Bld: 155 mg/dL — ABNORMAL HIGH (ref 70–99)
Potassium: 4.1 meq/L (ref 3.7–5.3)
Sodium: 148 meq/L — ABNORMAL HIGH (ref 137–147)

## 2014-01-14 LAB — GLUCOSE, CAPILLARY
GLUCOSE-CAPILLARY: 134 mg/dL — AB (ref 70–99)
Glucose-Capillary: 140 mg/dL — ABNORMAL HIGH (ref 70–99)
Glucose-Capillary: 161 mg/dL — ABNORMAL HIGH (ref 70–99)
Glucose-Capillary: 144 mg/dL — ABNORMAL HIGH (ref 70–99)

## 2014-01-14 MED ORDER — SENNA 8.6 MG PO TABS
1.0000 | ORAL_TABLET | Freq: Every day | ORAL | Status: DC
  Administered 2014-01-15 – 2014-01-16 (×2): 8.6 mg via ORAL
  Filled 2014-01-14 (×2): qty 1

## 2014-01-14 MED ORDER — DEXTROSE 5 % IV SOLN
INTRAVENOUS | Status: DC
  Administered 2014-01-14: 15:00:00 via INTRAVENOUS

## 2014-01-14 NOTE — Progress Notes (Signed)
[facility] Progress Note  TEAM 1 transfer   Savannah [name] ZOX:[id number] DOB: 1919-11-02 DOA: 01/05/2014 PCP: Oneal Grout, MD  Brief narrative: Savannah [name] is a 80 y.o. female presenting on 01/05/2014 with  has a past medical history of Hypertension; Hypercholesteremia; Pelvis fracture; Kidney stones; Diabetes mellitus, CVA (cerebral infarction) who presented from [name] long term [facility]. She was brought in after being found confused and less responsive. For the last several weeks, patient has been getting weaker per daughter- unable to use walker to get to the bathroom. She did endorse SOB and cough. Very hard of hearing, She was diagnosed with PNA in the ER and noted to have Hypotension, A-fib with RVR and admitted to SDU, required Cardizem gtt initially. SHe was treated with IV antibiotics and then developed volume overload and was diuresed briefly while in SDU and transferred to Floor 3/2.  She had swallow eval, started on diet, but continued to be lethargic off and on. CT head which showed R MCA infarct, Dr. Jomarie Longs d/w Neurology and started Plavix. Awaiting palliative care evaluation.  Subjective: Intermittent chest pain-currently this morning by sublingual nitroglycerin. Several BMs since last night. Poor oral intake.  Assessment/Plan:    Acute respiratory failure - likely due to CHF from uncontrolled HR and diastolic dysfunction and HCAP Vs Aspiration PNA  - Stopped Vanc, completed 8 days of cefepime. - SLP following, s/p MBS with moderate dysphagia, now on D1 diet with nectar thick liquids - was diuresed earlier in admission - held PO lasix since euvolemic now  HCAP Vs Aspiration PNA - Completed antibiotic treatment. At risk for recurrent aspiration.  Dehydration with Hypernatremia - from poor oral intake. As evidenced by poor oral intake, concentrated urine and high sodium. Continue gentle IV D5W.    P.Afibrillation - newly diagnosed and converted back to NSR - ECHO  completed- see below - not a candidate for anticoagulation , due to advanced age/fall risk - cont Metoprolol Po - off cardizem gtt - Antiplatelets held secondary to concern for intracranial hemorrhage/complaints of headache 2 days ago.   Lethargy - Probably multifactorial secondary to acute illness, mild hypernatremia complicating advanced age and dementia. Seems slightly better today.    Diastolic CHF, acute on chronic - ECHO reveals LVH- diuresed as above, now euvolemic - hold PO lasix. Clinically dehydrated.    HTN - cont Metoprolol PO    DM -Type II or unspecified type diabetes mellitus without mention of complication, uncontrolled   Dementia   Nutrition  - Continue current dysphagia 1 diet   Dysphagia  -on D1 diet as noted above  -moderate dysphagia  Ethics: due to advanced age, dementia, dysphagia and acute illness Dr. Jomarie Longs requested Palliative consult for Goals of care-input appreciated and discussed with Dr. Ladona Ridgel on 3/5: Family aware of overall poor status, ongoing decline. They would like to continue to treat the treatable and reassess in a day or 2.  Right MCA infarct - Dr. Jomarie Longs discussed with neurology on 3/3: Avoid anticoagulation due to fall risk and age. Advised to DC aspirin and start Plavix. No further workup planned because it will not change management at her age. Also palliative care team to evaluate. Due to new-onset headache and worsening mental status yesterday, concern for possible IC hemorrhage-did not get CT because would not change management but Plavix and DVT prophylaxis heparin held.  Anemia - Stable  Constipation - Start Bowel regimen.   Chest pain - Patient's son states that she's been having this  on and off. EKG without acute changes. No aggressive workup. When necessary NTG and Roxanol added.  DVT proph: lovenox  Code Status: DNR Family Communication: Discussed with son Mr. [employer] on 3/6 Disposition Plan: To be  determined  Consultants: Palliative care team  Procedures: none  Antibiotics: Antibiotics Given (last 72 hours)   Date/Time Action Medication Dose Rate   01/11/14 2316 Given   ceFEPIme (MAXIPIME) 1 g in dextrose 5 % 50 mL IVPB 1 g 100 mL/hr   01/12/14 1107 Given   ceFEPIme (MAXIPIME) 1 g in dextrose 5 % 50 mL IVPB 1 g 100 mL/hr   01/12/14 2100 Given   ceFEPIme (MAXIPIME) 1 g in dextrose 5 % 50 mL IVPB 1 g 100 mL/hr       DVT prophylaxis: Lovenox  Objective: Filed Weights   01/12/14 0620 01/13/14 0448 01/14/14 0440  Weight: 79.1 kg (174 lb 6.1 oz) 79 kg (174 lb 2.6 oz) 79 kg (174 lb 2.6 oz)   Blood pressure 131/70, pulse 72, temperature 98.3 F (36.8 C), temperature source Oral, resp. rate 20, height 5\' 5"  (1.651 m), weight 79 kg (174 lb 2.6 oz), SpO2 97.00%.  Intake/Output Summary (Last 24 hours) at 01/14/14 1516 Last data filed at 01/14/14 1255  Gross per 24 hour  Intake 485.83 ml  Output    201 ml  Net 284.83 ml     Exam: General: in no respiratory distress Lungs: Slightly diminished breath sounds in the bases but rest of lung fields clear. No increased work of breathing  Cardiovascular: Regular rate and rhythm without murmur gallop or rub normal S1 and S2.  Abdomen: Nontender, nondistended, soft, bowel sounds positive, no rebound, no ascites, no appreciable mass. Foley with concentrated urine. Extremities: No significant cyanosis, clubbing, or edema bilateral lower extremities Neuro: Alert and oriented only to self. No obvious focal deficits  Data Reviewed: Basic Metabolic Panel:  Recent Labs Lab 01/10/14 0429 01/11/14 0615 01/12/14 0438 01/13/14 0620 01/14/14 0605  NA 148* 145 150* 148* 148*  K 3.1* 3.6* 4.1 4.0 4.1  CL 100 99 102 101 101  CO2 38* 37* 38* 39* 39*  GLUCOSE 117* 110* 142* 130* 155*  BUN 26* 26* 22 21 21   CREATININE 0.82 0.76 0.72 0.69 0.65  CALCIUM 9.1 9.2 9.4 9.1 9.3   Liver Function Tests: No results found for this basename:  AST, ALT, ALKPHOS, BILITOT, PROT, ALBUMIN,  in the last 168 hours No results found for this basename: LIPASE, AMYLASE,  in the last 168 hours No results found for this basename: AMMONIA,  in the last 168 hours CBC:  Recent Labs Lab 01/10/14 0429 01/11/14 0615  WBC 7.6 7.9  HGB 11.4* 11.5*  HCT 38.5 38.8  MCV 82.4 83.1  PLT 333 317   Cardiac Enzymes: No results found for this basename: CKTOTAL, CKMB, CKMBINDEX, TROPONINI,  in the last 168 hours BNP (last 3 results)  Recent Labs  08/23/13 2224  PROBNP 184.6   CBG:  Recent Labs Lab 01/13/14 1203 01/13/14 1803 01/13/14 2113 01/14/14 0813 01/14/14 1131  GLUCAP 162* 116* 148* 134* 140*    Recent Results (from the past 240 hour(s))  URINE CULTURE     Status: None   Collection Time    01/05/14  9:43 AM      Result Value Ref Range Status   Specimen Description URINE, RANDOM   Final   Special Requests NONE   Final   Culture  Setup Time  Final   Value: 01/05/2014 14:52     Performed at [facility]   Colony Count     Final   Value: NO GROWTH     [facility]formed [facility]   Culture     Final   Value: NO GROWTH     [facility]formed at [facility]   Report Status 01/06/2014 FINAL   Final  CULTURE, BLOOD (ROUTINE X 2)     Status: None   Collection Time    01/05/14 10:14 AM      Result Value Ref Range Status   Specimen Description BLOOD RIGHT ANTECUBITAL   Final   Special Requests BOTTLES DRAWN AEROBIC AND ANAEROBIC 10CCS   Final   Culture  Setup Time     Final   Value: 01/05/2014 14:37     [facility]formed at [facility]   Culture     Final   Value: NO GROWTH 5 DAYS     [facility]formed at [facility]   Report Status 01/11/2014 FINAL   Final  CULTURE, BLOOD (ROUTINE X 2)     Status: None   Collection Time    01/05/14 10:26 AM      Result Value Ref Range Status   Specimen Description BLOOD LEFT ANTECUBITAL   Final   Special Requests BOTTLES DRAWN AEROBIC AND ANAEROBIC 5CCS   Final    Culture  Setup Time     Final   Value: 01/05/2014 14:37     [facility]formed at [facility]   Culture     Final   Value: NO GROWTH 5 DAYS     [facility]formed at [facility]   Report Status 01/11/2014 FINAL   Final  MRSA PCR SCREENING     Status: Abnormal   Collection Time    01/05/14  1:47 PM      Result Value Ref Range Status   MRSA by PCR POSITIVE (*) NEGATIVE Final   Comment:            The GeneXpert MRSA Assay (FDA     approved for NASAL specimens     only), is one component of a     comprehensive MRSA colonization     surveillance program. It is not     intended to diagnose MRSA     infection nor to guide or     monitor treatment for     MRSA infections.     RESULT CALLED TO, READ BACK BY AND VERIFIED WITH:     [facility] RN 16:40 01/05/14 ([email or web address])     Studies:  Recent x-ray studies have been reviewed in detail by the Attending Physician  Scheduled Meds:  Scheduled Meds: . antiseptic oral rinse  15 mL Mouth Rinse BID  . feeding supplement (ENSURE)  1 Container Oral TID BM  . hydrALAZINE  10 mg Intravenous Once  . insulin aspart  0-5 Units Subcutaneous QHS  . insulin aspart  0-9 Units Subcutaneous TID WC  . metoprolol tartrate  25 mg Oral BID  . pantoprazole sodium  40 mg Oral Daily  . senna  2 tablet Oral Daily  . sodium chloride  3 mL Intravenous Q12H   Continuous Infusions:    Time spent on care of this patient: 25 min   [facility]NGALGI,ANAND, MD [phone]  [facility] Office  [phone] Pager - Text Page per Amion as per below:  On-Call/Text Page:      Loretha [email or web address]      password [email or web address]  If 7PM-7AM, please contact night-coverage [email or web address] Password [email or web address] 01/14/2014, 3:16 PM   LOS: 9 days

## 2014-01-14 NOTE — Clinical Social Work Note (Signed)
CSW checked in with patient and family at bedside. Patient's son Savannah [name] was at bedside with his wife and two neighbors. Savannah [name] states that the family is doing well and supporting each other during this difficult time. CSW informed Savannah [name] that CSW has been updating facility on patient's condition as the plan at this time is for her to return when medically stable. Savannah [name] did mention that PMT has been involved with his mother's care and the family has appreciated the support from PMT. CSW answered Wayne's questions and explained that CSW would check in on Monday.   Savannah [name], [place], [place], [phone]

## 2014-01-14 NOTE — Progress Notes (Signed)
Patient complained of chest pain. Unable to rate with numerical scale. Gave one sublingual nitrostat tab and patient stated that chest pain was gone within 1 minute of administration. Will continue to monitor.

## 2014-01-15 ENCOUNTER — Inpatient Hospital Stay (HOSPITAL_COMMUNITY): Payer: PRIVATE HEALTH INSURANCE

## 2014-01-15 DIAGNOSIS — R079 Chest pain, unspecified: Secondary | ICD-10-CM

## 2014-01-15 LAB — BASIC METABOLIC PANEL
BUN: 17 mg/dL (ref 6–23)
CO2: 39 mEq/L — ABNORMAL HIGH (ref 19–32)
Calcium: 9 mg/dL (ref 8.4–10.5)
Chloride: 91 mEq/L — ABNORMAL LOW (ref 96–112)
Creatinine, Ser: 0.58 mg/dL (ref 0.50–1.10)
GFR, EST AFRICAN AMERICAN: 90 mL/min — AB (ref >90)
GFR, EST NON AFRICAN AMERICAN: 78 mL/min — AB (ref >90)
Glucose, Bld: 142 mg/dL — ABNORMAL HIGH (ref 70–99)
POTASSIUM: 3.7 meq/L (ref 3.7–5.3)
SODIUM: 138 meq/L (ref 137–147)

## 2014-01-15 LAB — GLUCOSE, CAPILLARY
GLUCOSE-CAPILLARY: 144 mg/dL — AB (ref 70–99)
GLUCOSE-CAPILLARY: 165 mg/dL — AB (ref 70–99)
Glucose-Capillary: 171 mg/dL — ABNORMAL HIGH (ref 70–99)
Glucose-Capillary: 79 mg/dL (ref 70–99)

## 2014-01-15 MED ORDER — ALBUTEROL SULFATE (2.5 MG/3ML) 0.083% IN NEBU: 2.5000 mg | INHALATION_SOLUTION | Freq: Once | RESPIRATORY_TRACT | Status: DC

## 2014-01-15 MED ORDER — FUROSEMIDE 10 MG/ML IJ SOLN
INTRAMUSCULAR | Status: AC
  Filled 2014-01-15: qty 4

## 2014-01-15 MED ORDER — MORPHINE SULFATE (CONCENTRATE) 10 MG /0.5 ML PO SOLN
5.0000 mg | ORAL | Status: DC | PRN
  Administered 2014-01-15 – 2014-01-16 (×3): 5 mg via SUBLINGUAL
  Filled 2014-01-15 (×3): qty 0.5

## 2014-01-15 MED ORDER — FUROSEMIDE 10 MG/ML IJ SOLN
20.0000 mg | Freq: Once | INTRAMUSCULAR | Status: AC
  Administered 2014-01-15: 20 mg via INTRAVENOUS

## 2014-01-15 NOTE — Progress Notes (Signed)
Patient Savannah [name]      DOB: 1918-12-25      LTE:[id number]   Palliative Medicine Team at [facility] Progress Note    Subjective:  Savannah [name] did not do well this am.  She was having chest pain, shortness of breath and increased pulmonary edema with increased work of breath.  Dr. Wayland Denis expertly assessed and managed her symptoms and this afternoon she was much more comfortable.  These episodes are becoming more frequent and we have limited ability to treat her other conditions (ie hypernatremia because of that ).  I met with her son Savannah [name] , Daughter Savannah [name] , and Savannah [name].  While there Savannah [name] would intermittent speak out and at one point stated.  "Take me home Jesus".  I explained to her family that I believe we have reached our limit for curative care and that [facility] would likely benefit from focusing on her comfort. They are in agreement. They would like her to be offered and assisted with food and drink,  They would like necessary medications to be given until she can not take them any longer, and they would like her symptoms to be treated aggressively.  We do not need further labs to meet these needs.     Filed Vitals:   01/15/14 0956  BP: 130/79  Pulse: 121  Temp: 98 F (36.7 C)  Resp: 20   Physical exam:  General:  80 yr old female admitted with symptoms of pneumonia, completed a course of antibiotics but then found to have a right frontal temporal CVA - large.  Course complicated by likely congestive heart failure with chest pain can't rule out angina.  Patient responds to lasix and NTG.  Family would like to pursue hospice facility placement.  1.  DNR  2.  Headache: improved  3. Altered mental status likely stupor related to CVA  4.  Likely CHF : continue PO lasix if needed 5.  Angina: prn NTG, rate control with metoprolol  6.  Comfort feeding-  Please awaken patient to offer food as she is eating.   7 .  No further labs.  Total time:  230 - 250 pm  Bela Nyborg L. Lovena Le, MD  MBA The Palliative Medicine Team at [facility] Phone: [phone] Pager: [phone]  Updated Dr. Algis Liming            Assessment and plan:

## 2014-01-15 NOTE — Progress Notes (Signed)
Patient ZO:XWRU:[name]      DOB: 06/09/1919      EAV:[id number]  Removed patient's wedding band using the string technique.  No tissue damage.  Daughter Savannah [name] in possession of ring , confirmed property transfer with patient's nurse in the room.   Savannah Meller L. Ladona Ridgelaylor, MD MBA The Palliative Medicine Team at [facility] Team Phone: [phone] Pager: [phone]

## 2014-01-15 NOTE — Progress Notes (Signed)
Patient ZO:XWRU:Savannah [name]      DOB: 10/06/1919      EAV:[id number]  Patient remains critically ill. No family at the bedside.  Watchful waiting.  Hasani Diemer L. Ladona Ridgelaylor, MD MBA The Palliative Medicine Team at [facility] Team Phone: [phone] Pager: [phone]

## 2014-01-15 NOTE — Progress Notes (Signed)
Pt complained of shortness of breath and chest pain. Oxygen saturation 90% on 2L. Increased oxygen flow to 3L and saturation increased to 92-93%. Sat patient up in bed, and administered nitrostat. Patient required all 3 doses. On-call provider was paged x2. RN attempted to auscultate patient's lungs, but was unable to hear clearly due to patient talking/moaning. Patient attempting to cough up mucous, mouth was suctioned. Patient had difficulty clearing mucous due to a weak cough. IV fluids were stopped. PRN Duoneb administered. Patient continued to complain "I can't breathe good, Jesus, I don't want to die". Stat 12-lead EKG completed (see chart for result) and stat portable chest was completed. IV lasix ordered and administered.  MD on unit came to assess. Will continue to monitor.

## 2014-01-15 NOTE — Progress Notes (Signed)
[facility] Progress Note  TEAM 1 transfer   PAOLA ALESHIRE GBT:[id number] DOB: [birth year] DOA: 01/05/2014 PCP: Blanchie Serve, MD  Brief narrative: Savannah [name] is a 80 y.o. female presenting on 01/05/2014 with  has a past medical history of Hypertension; Hypercholesteremia; Pelvis fracture; Kidney stones; Diabetes mellitus, CVA (cerebral infarction) who presented from [place] long term SNF. She was brought in after being found confused and less responsive. For the last several weeks, patient has been getting weaker per daughter- unable to use walker to get to the bathroom. She did endorse SOB and cough. Very hard of hearing, She was diagnosed with PNA in the ER and noted to have Hypotension, A-fib with RVR and admitted to SDU, required Cardizem gtt initially. SHe was treated with IV antibiotics and then developed volume overload and was diuresed briefly while in SDU and transferred to Floor 3/2.  She had swallow eval, started on diet, but continued to be lethargic off and on. CT head which showed R MCA infarct, Dr. Broadus John d/w Neurology and started Plavix. Awaiting palliative care evaluation.  Subjective: Intermittent chest pain-currently this morning by sublingual nitroglycerin. Several BMs since last night. Poor oral intake.  Assessment/Plan:    Acute respiratory failure - likely due to CHF from uncontrolled HR and diastolic dysfunction and HCAP Vs Aspiration PNA  - Stopped Vanc, completed 8 days of cefepime. - SLP following, s/p MBS with moderate dysphagia, now on D1 diet with nectar thick liquids - was diuresed earlier in admission - Patient again had acute respiratory failure on 3/8 AM, likely secondary to IV fluid resuscitation-DC'd IV fluids, oxygen, nebulizations, IV Lasix-improved. -Dr. Lovena Le met with family again on 3/8 PM and transition to comfort oriented care.  HCAP Vs Aspiration PNA - Completed antibiotic treatment. At risk for recurrent aspiration.  Dehydration with  Hypernatremia - from poor oral intake. As evidenced by poor oral intake, concentrated urine and high sodium. Had to discontinue D5W secondary to pulmonary edema    P.Afibrillation - newly diagnosed and converted back to NSR - ECHO completed- see below - not a candidate for anticoagulation , due to advanced age/fall risk - cont Metoprolol Po - off cardizem gtt - Antiplatelets held secondary to concern for intracranial hemorrhage/complaints of headache 2 days ago.   Lethargy - Probably multifactorial secondary to acute illness, mild hypernatremia complicating advanced age and dementia. Seems slightly better today.    Diastolic CHF, acute on chronic - ECHO reveals LVH- diuresed as above, now euvolemic - hold PO lasix. Clinically dehydrated.    HTN - cont Metoprolol PO    DM -Type II or unspecified type diabetes mellitus without mention of complication, uncontrolled   Dementia   Nutrition  - Continue current dysphagia 1 diet   Dysphagia  -on D1 diet as noted above  -moderate dysphagia  Ethics: due to advanced age, dementia, dysphagia and acute illness Dr. Broadus John requested Palliative consult for Goals of care-input appreciated and discussed with Dr. Lovena Le on 3/5: Family aware of overall poor status, ongoing decline. They would like to continue to treat the treatable and reassess in a day or 2.  Right MCA infarct - Dr. Broadus John discussed with neurology on 3/3: Avoid anticoagulation due to fall risk and age. Advised to DC aspirin and start Plavix. No further workup planned because it will not change management at her age. Also palliative care team to evaluate. Due to new-onset headache and worsening mental status yesterday, concern for possible IC hemorrhage-did not get CT because  would not change management but Plavix and DVT prophylaxis heparin held.  Anemia - Stable  Constipation - Start Bowel regimen.   Chest pain - Patient's son states that she's been having this on and off.  EKG without acute changes. No aggressive workup. When necessary NTG and Roxanol added.  DVT proph: lovenox  Code Status: DNR Family Communication: Discussed with son Mr. [employer] on 3/6 Disposition Plan: To be determined  Consultants: Palliative care team  Procedures: none  Antibiotics: Antibiotics Given (last 72 hours)   Date/Time Action Medication Dose Rate   01/12/14 2100 Given   ceFEPIme (MAXIPIME) 1 g in dextrose 5 % 50 mL IVPB 1 g 100 mL/hr       DVT prophylaxis: Lovenox  Objective: Filed Weights   01/13/14 0448 01/14/14 0440 01/15/14 0543  Weight: 79 kg (174 lb 2.6 oz) 79 kg (174 lb 2.6 oz) 80.423 kg (177 lb 4.8 oz)   Blood pressure 137/70, pulse 90, temperature 98.1 F (36.7 C), temperature source Oral, resp. rate 20, height $RemoveBe'5\' 5"'VIDysfHBS$  (1.651 m), weight 80.423 kg (177 lb 4.8 oz), SpO2 96.00%.  Intake/Output Summary (Last 24 hours) at 01/15/14 1756 Last data filed at 01/15/14 0959  Gross per 24 hour  Intake   1380 ml  Output    730 ml  Net    650 ml     Exam: General: Seen this morning with respiratory distress Lungs: Decreased breath sounds bilaterally with scattered bilateral coarse expiratory rhonchi and few basal crackles. Mild to moderate increased work of breathing  Cardiovascular: Regular rate and rhythm without murmur gallop or rub normal S1 and S2.  Abdomen: Nontender, nondistended, soft, bowel sounds positive, no rebound, no ascites, no appreciable mass. Foley with concentrated urine. Extremities: No significant cyanosis, clubbing, or edema bilateral lower extremities Neuro: Somnolent and barely arousable, not oriented. No obvious focal deficits  Data Reviewed: Basic Metabolic Panel:  Recent Labs Lab 01/11/14 0615 01/12/14 0438 01/13/14 0620 01/14/14 0605 01/15/14 0620  NA 145 150* 148* 148* 138  K 3.6* 4.1 4.0 4.1 3.7  CL 99 102 101 101 91*  CO2 37* 38* 39* 39* 39*  GLUCOSE 110* 142* 130* 155* 142*  BUN 26* $Remov'22 21 21 17  'tQGfCV$ CREATININE  0.76 0.72 0.69 0.65 0.58  CALCIUM 9.2 9.4 9.1 9.3 9.0   Liver Function Tests: No results found for this basename: AST, ALT, ALKPHOS, BILITOT, PROT, ALBUMIN,  in the last 168 hours No results found for this basename: LIPASE, AMYLASE,  in the last 168 hours No results found for this basename: AMMONIA,  in the last 168 hours CBC:  Recent Labs Lab 01/10/14 0429 01/11/14 0615  WBC 7.6 7.9  HGB 11.4* 11.5*  HCT 38.5 38.8  MCV 82.4 83.1  PLT 333 317   Cardiac Enzymes: No results found for this basename: CKTOTAL, CKMB, CKMBINDEX, TROPONINI,  in the last 168 hours BNP (last 3 results)  Recent Labs  08/23/13 2224  PROBNP 184.6   CBG:  Recent Labs Lab 01/14/14 1743 01/14/14 2143 01/15/14 0833 01/15/14 1130 01/15/14 1634  GLUCAP 161* 144* 144* 165* 171*    No results found for this or any previous visit (from the past 240 hour(s)).   Studies:  Recent x-ray studies have been reviewed in detail by the Attending Physician  Scheduled Meds:  Scheduled Meds: . albuterol  2.5 mg Nebulization Once  . antiseptic oral rinse  15 mL Mouth Rinse BID  . feeding supplement (ENSURE)  1 Container Oral TID  BM  . furosemide      . hydrALAZINE  10 mg Intravenous Once  . insulin aspart  0-5 Units Subcutaneous QHS  . insulin aspart  0-9 Units Subcutaneous TID WC  . metoprolol tartrate  25 mg Oral BID  . pantoprazole sodium  40 mg Oral Daily  . senna  1 tablet Oral Daily  . sodium chloride  3 mL Intravenous Q12H   Continuous Infusions:    Time spent on care of this patient: 25 min   [facility] [place]  [facility] Office  [phone] Pager - Text Page per Amion as per below:  On-Call/Text Page:      [email or web address]      password [email or web address]  If 7PM-7AM, please contact night-coverage [email or web address] Password [email or web address] 01/15/2014, 5:56 PM   LOS: 10 days

## 2014-01-16 LAB — GLUCOSE, CAPILLARY
GLUCOSE-CAPILLARY: 105 mg/dL — AB (ref 70–99)
GLUCOSE-CAPILLARY: 127 mg/dL — AB (ref 70–99)

## 2014-01-16 MED ORDER — MORPHINE SULFATE (CONCENTRATE) 10 MG /0.5 ML PO SOLN: 5.0000 mg | ORAL | Status: DC | PRN

## 2014-01-16 MED ORDER — STARCH (THICKENING) PO POWD: ORAL | Status: DC

## 2014-01-16 MED ORDER — NITROGLYCERIN 0.4 MG SL SUBL: 0.4000 mg | SUBLINGUAL_TABLET | SUBLINGUAL | Status: AC | PRN

## 2014-01-16 MED ORDER — ENSURE PUDDING PO PUDG: 1.0000 | Freq: Three times a day (TID) | ORAL | Status: DC

## 2014-01-16 MED ORDER — BISACODYL 10 MG RE SUPP: 10.0000 mg | Freq: Every day | RECTAL | Status: DC | PRN

## 2014-01-16 MED ORDER — METOPROLOL TARTRATE 25 MG PO TABS: 25.0000 mg | ORAL_TABLET | Freq: Two times a day (BID) | ORAL | Status: DC

## 2014-01-16 MED ORDER — ALBUTEROL SULFATE (2.5 MG/3ML) 0.083% IN NEBU: 2.5000 mg | INHALATION_SOLUTION | RESPIRATORY_TRACT | Status: DC | PRN

## 2014-01-16 NOTE — Progress Notes (Signed)
Physical Therapy Discharge Patient Details Name: Savannah [name] MRN: [id number] DOB: 02/11/1919 Today's Date: 01/16/2014 Time:  -     Patient discharged from PT services secondary to pt to transfer to palliative care SNF and full comfort care at this time..  Please see latest therapy progress note for current level of functioning and progress toward goals.  Pt lethargic and very anxious and scared during last session, do not feel that therapy is currently the best way to care for pt given that it is causing her anxiety.  Progress and discharge plan discussed with patient and/or caregiver: Patient unable to participate in discharge planning and no caregivers available  GP   Savannah [name], PT  Acute Rehab Services  [phone]   Savannah CoManess, Savannah [name] 01/16/2014, 10:53 AM

## 2014-01-16 NOTE — Clinical Social Work Note (Signed)
CSW spoke with patient's son [place]Wayne. Harriett SineNancy and [place] BottomWayne have discussed the options for their mother's disposition. Deniece PortelaWayne states that the family has decided on bring their [facility]. CSW informed family that hospice will not be at their mother's bedside 24/7 but would leave once they have taken care of what they were there to do. Patient's children understand this and wish to go forward with [facility]. Deniece PortelaWayne and Harriett Sineancy state that they do not want to place their mother in any of the other hospice facilities. CSW has confirmed with [facility] Liaison Forrestine Himva Davis that there is not bed availability for today or tomorrow. Family understands that patient cannot remain in the hospital until a bed is available at their preferred facility. CSW contacted Tiburcio PeaKarla Stapleton with [facility] who states they will be able to give patient a private room and the family will incur no additional cost since [facility] [place]. CSW has paged MD to make him aware.  Roddie McBryant Cainan Trull, [place] Lake[place]CSWA, [place]AS[place], [phone]

## 2014-01-16 NOTE — Progress Notes (Signed)
Nutrition Brief Note  Chart reviewed. Pt now transitioning to comfort care.  No further nutrition interventions warranted at this time.  Please re-consult as needed.   Kiaira Pointer MS, RD, LDN Inpatient Registered Dietitian Pager: [phone] After-hours pager: [phone]    

## 2014-01-16 NOTE — Clinical Social Work Note (Signed)
[facility] ref[facility]. CSW informed daughter that CSW will have to make additional referrals in case [facility] not [facility]. CSW also informed daughter that patient can return to [facility] with hosp[facility] services per admission coordinator.   Roddie McBryant Lance Galas, [place]CSWA, [place], [phone]

## 2014-01-16 NOTE — Clinical Social Work Note (Signed)
Per MD patient ready to DC. Patient will [facility] with [facility]. RN, patient's family Deniece Portela(Wayne and [place]), and facility notified of DC and patient's needs. RN given number for report. Dc packet on chart. CSW requested ambulance transport for patient. CSW signing off.  Roddie McBryant Jeffrey Voth, [place]CSWA, [place]AS[place], [phone]

## 2014-01-16 NOTE — Progress Notes (Signed)
Pt prepared for d/c to SNF. IV d/c'd. Skin intact except as most recently charted. Vitals are stable. Report called to receiving facility. Pt to be transported by ambulance service. 

## 2014-01-16 NOTE — Discharge Summary (Signed)
Physician Discharge Summary  Savannah [name] FHL:[id number] DOB: 1919-08-24 DOA: 01/05/2014  PCP: Blanchie Serve, MD  Admit date: 01/05/2014 Discharge date: 01/16/2014  Time spent: Greater than 30 minutes  Recommendations for Outpatient Follow-up:  1. MD at SNF facility, to assess comfort and hospice consultation. 2. Continue oxygen and nasal cannula at 2 L per minute and titrate to comfort. 3. Continue indwelling Foley catheter for comfort.  Discharge Diagnoses:  Principal Problem:   Acute respiratory failure Active Problems:   Type II or unspecified type diabetes mellitus without mention of complication, uncontrolled   Atrial fibrillation   Dementia   Diastolic CHF, acute on chronic   Discharge Condition: Guarded and declining  Diet recommendation: Dysphagia 1 diet and nectar thick liquids  Filed Weights   01/14/14 0440 01/15/14 0543 01/16/14 0626  Weight: 79 kg (174 lb 2.6 oz) 80.423 kg (177 lb 4.8 oz) 80.8 kg (178 lb 2.1 oz)    History of present illness:  80 year old female with history of hypertension, hypercholesterolemia, type II DM, CVA, dementia, a resident of [facility] was admitted on 01/05/14 with altered mental status, progressive weakness and dyspnea.In the ER, she was found to have a PNA and was in a fib with RVR- family denies history. She was also hypotensive but BP responded to IVF. Family confirms she is a DNR. In the ER she was given abx for [facility] and cardizem gtt for atrial fibrillation.    Hospital Course:   1. Acute encephalopathy: Likely secondary to multiple acute medical conditions including large CVA complicating underlying possible dementia. Mental status continues to progressively decline with decline in her overall condition. At this time family have opted to transfer her back to the skilled nursing facility with hospice care. There focus is comfort oriented care. 2. Aspiration pneumonia versus healthcare associated pneumonia: Patient was  treated with broad-spectrum IV antibiotics including vancomycin and cefepime-completed course. She most likely has recurrent aspirations leading to respiratory compromise. 3. Acute respiratory failure: Secondary to pneumonia and decompensated CHF. Patient continues to have intermittent flareup of dyspnea-improved after nebulizations and a dose of Lasix yesterday. Continue oxygen, nebulizations and when necessary Roxanol 4. Acute on chronic diastolic CHF: Likely precipitated by acute illness in A. fib with RVR. May use when necessary Lasix for comfort. 5. Hypertension: Controlled. Continue oral metoprolol 6. Dysphagia: Patient is not eating much and seems to be actively aspirating. 7. A. fib with RVR: Initially controlled with IV Cardizem and then transitioned to oral metoprolol. Not a candidate for anticoagulation secondary to advanced age and fall risk. Antiplatelets also discontinued due to concern for headache, altered mental status and possibility of intracranial bleed-no workup check it out secondary to comfort oriented care. 8. History of type II DM 9. History of dementia 10. Right MCA infarct: After discussion with neurology, antiplatelets were briefly changed from aspirin to Plavix. However after patient started complaining of headache, there was concern of intracranial bleed and since family was transitioning to comfort oriented care, no further imaging was done but antiplatelets were discontinued. 11. Anemia: Stable 12. Constipation: Improved after all regimen. 13. Dehydration with hypernatremia: Over the last couple of days, patient's sodium started climbing in the context of poor oral intake. This was also associated with decreased urinary output. Patient was started on gentle IV fluid hydration but went into recurrent pulmonary edema. At this point palliative care team met with family 48. Intermittent chest pain/angina: Continue when necessary NTG. No aggressive workup secondary to comfort  oriented care.  15.  DO NOT RESUSCITATE: Dr. Lovena Le from palliative care team consulted and continued to see patient on a daily basis and met with family on several operations. She discussed with them on 3/8 after an episode of  respiratory decompensation and they opted for comfort oriented care.     Consultations:  Palliative care medicine   Procedures:   Foley catheter     Discharge Exam:  Complaints:   patient resting comfortably-did not arouse.   Filed Vitals:   01/15/14 1452 01/15/14 2040 01/16/14 0626 01/16/14 1006  BP: 137/70 149/72 133/71 114/56  Pulse: 90 81 69 71  Temp: 98.1 F (36.7 C) 98.4 F (36.9 C) 97.4 F (36.3 C)   TempSrc: Oral Oral Oral   Resp: $Remo'20 20 20   'mYBqV$ Height:      Weight:   80.8 kg (178 lb 2.1 oz)   SpO2: 96% 96% 96%     General: Patient appears comfortable and lying in bed.  Lungs: Improved breath sounds bilaterally compared to yesterday but still has reduced breath sounds in the bases with few scattered basal crackles in bilateral occasional rhonchi. No increased work of breathing  Cardiovascular: Regular rate and rhythm without murmur gallop or rub normal S1 and S2.  Abdomen: Nontender, nondistended, soft, bowel sounds positive, no rebound, no ascites, no appreciable mass. Foley with concentrated urine.  Extremities: No significant cyanosis, clubbing, or edema bilateral lower extremities  Neuro: Somnolent and barely arousable, not oriented. No obvious focal deficits  Discharge Instructions      Discharge Orders   Future Orders Complete By Expires   ([place]) Call MD:  Anytime you have any of the following symptoms: 1) 3 pound weight gain in 24 hours or 5 pounds in 1 week 2) shortness of breath, with or without a dry hacking cough 3) swelling in the hands, feet or stomach 4) if you have to sleep on extra pillows at night in order to breathe.  As directed    Call MD for:  difficulty breathing, headache or visual disturbances  As  directed    Call MD for:  persistant nausea and vomiting  As directed    Call MD for:  severe uncontrolled pain  As directed    Call MD for:  temperature >100.4  As directed    Discharge instructions  As directed    Comments:     1. Diet: Dysphagia 1 diet and nectar thick liquids for comfort. 2. Continue oxygen via nasal cannula at 2 L per minute and titrate for comfort 3. Continue Foley catheter for comfort   Increase activity slowly  As directed        Medication List    STOP taking these medications       aspirin EC 81 MG tablet     calcium-vitamin D 500-200 MG-UNIT per tablet  Commonly known as:  OSCAL WITH D     ferrous sulfate 325 (65 FE) MG tablet     multivitamin with minerals Tabs tablet     polyethylene glycol packet  Commonly known as:  MIRALAX / GLYCOLAX     saccharomyces boulardii 250 MG capsule  Commonly known as:  FLORASTOR     tiotropium 18 MCG inhalation capsule  Commonly known as:  SPIRIVA     vitamin B-12 1000 MCG tablet  Commonly known as:  CYANOCOBALAMIN     Vitamin D-3 1000 UNITS Caps      TAKE these medications       albuterol (  2.5 MG/3ML) 0.083% nebulizer solution  Commonly known as:  PROVENTIL  Take 3 mLs (2.5 mg total) by nebulization every 2 (two) hours as needed for wheezing or shortness of breath.     bisacodyl 10 MG suppository  Commonly known as:  DULCOLAX  Place 1 suppository (10 mg total) rectally daily as needed for moderate constipation.     DSS 100 MG Caps  Take 100 mg by mouth 2 (two) times daily.     feeding supplement (ENSURE) Pudg  Take 1 Container by mouth 3 (three) times daily between meals.     food thickener Powd  Commonly known as:  THICK IT  Oral, As needed, to thicken fluids     ipratropium-albuterol 0.5-2.5 (3) MG/3ML Soln  Commonly known as:  DUONEB  Take 3 mLs by nebulization 3 (three) times daily.     metoprolol tartrate 25 MG tablet  Commonly known as:  LOPRESSOR  Take 1 tablet (25 mg total) by  mouth 2 (two) times daily.     morphine CONCENTRATE 10 mg / 0.5 ml concentrated solution  Place 0.25 mLs (5 mg total) under the tongue every 4 (four) hours as needed for shortness of breath (pain).     nitroGLYCERIN 0.4 MG SL tablet  Commonly known as:  NITROSTAT  Place 1 tablet (0.4 mg total) under the tongue every 5 (five) minutes as needed for chest pain.     omeprazole 20 MG capsule  Commonly known as:  PRILOSEC  Take 20 mg by mouth daily.          The results of significant diagnostics from this hospitalization (including imaging, microbiology, ancillary and laboratory) are listed below for reference.    Significant Diagnostic Studies: Ct Head Wo Contrast  01/10/2014   CLINICAL DATA:  Altered mental status, lethargy  EXAM: CT HEAD WITHOUT CONTRAST  TECHNIQUE: Contiguous axial images were obtained from the base of the skull through the vertex without intravenous contrast.  COMPARISON:  CT HEAD W/O CM dated 08/23/2013  FINDINGS: There is a large geographic region of low attenuation within the right frontotemporal lobe involving the white matter and cortical gray matter most consistent with a right MCA acute infarction. There is no evidence of hemorrhage. There is mild compression of the anterior horn of the right ventricle from edema from the presumed infarction.  The generalized cortical atrophy. Periventricular subcortical white matter hypodensities. Paranasal sinuses and mastoid air cells are clear.  IMPRESSION: Findings consistent with  larger right MCA territory infarction.  Mild mass effect upon the right ventricle.  No evidence of hemorrhage.  Findings conveyed toCrystal Mefford, RNon 01/10/2014  at12:26.   Electronically Signed   By: Genevive Bi M.D.   On: 01/10/2014 12:27   Dg Chest Port 1 View  01/15/2014   CLINICAL DATA:  Dyspnea.  EXAM: PORTABLE CHEST - 1 VIEW  COMPARISON:  Chest x-ray 01/12/2014.  FINDINGS: Lung volumes are very low. There are bibasilar opacities (left  greater than right), which have increased, and are compatible with worsening areas of atelectasis and/or consolidation. Possible small right and moderate left-sided pleural effusions. Pulmonary venous congestion. Indistinct interstitial markings may suggest mild interstitial pulmonary edema. Enlargement of the cardiopericardial silhouette may in part be positional, and in part related to the presence of a large hiatal hernia (retrocardiac lucency again noted, compatible with a large hiatal hernia). Mediastinal contours are grossly distorted by patient positioning. Atherosclerosis in the thoracic aorta.  IMPRESSION: 1. Decreasing lung volumes with worsening bibasilar (left  greater than right) atelectasis and/or consolidation. 2. Increasing moderate left pleural effusion and small right pleural effusion. 3. Cardiomegaly with evidence of mild congestive heart failure, as above. 4. Atherosclerosis. 5. Large hiatal hernia.   Electronically Signed   By: Vinnie Langton M.D.   On: 01/15/2014 07:49   Dg Chest Port 1 View  01/12/2014   CLINICAL DATA:  Dyspnea, HCAP  EXAM: PORTABLE CHEST - 1 VIEW  COMPARISON:  DG CHEST 1V PORT dated 01/06/2014  FINDINGS: The lungs are adequately inflated. The left hemidiaphragm remains obscured and the retrocardiac region on the left remains dense. The pulmonary interstitial markings are less conspicuous. The cardiopericardial silhouette remains enlarged. The pulmonary vascularity is not clearly engorged. There is no significant pleural effusion on the right. Pleural fluid on the left cannot be excluded. The observed portions of the bony thorax exhibit no acute abnormalities.  IMPRESSION: There has been mild interval improvement in the appearance of the pulmonary interstitium bilaterally. There remains atelectasis or pneumonia at the left lung base. The right lung base is clearer today.   Electronically Signed   By: David  Martinique   On: 01/12/2014 08:32   Dg Chest Port 1 View  01/06/2014    CLINICAL DATA:  Hypoxia  EXAM: PORTABLE CHEST - 1 VIEW  COMPARISON:  01/05/2014  FINDINGS: Progression of bibasilar airspace disease. Progression of small bilateral effusions.  Cardiac enlargement with mild vascular congestion. Apical scarring bilaterally.  IMPRESSION: Progression of bibasilar atelectasis/infiltrate and bilateral effusions. Mild fluid overload also possible.   Electronically Signed   By: Franchot Gallo M.D.   On: 01/06/2014 12:55   Dg Abd Acute W/chest  01/05/2014   CLINICAL DATA:  Atrial fibrillation, question free abdominal air  EXAM: ACUTE ABDOMEN SERIES (ABDOMEN 2 VIEW & CHEST 1 VIEW)  COMPARISON:  08/24/2013  FINDINGS: Cardiomegaly again noted. There is bilateral basilar atelectasis or infiltrate. No pulmonary edema.  No free abdominal air is noted. Mild distended bowel loops in right abdomen suspicious for ileus, partial obstruction or enteritis.  IMPRESSION: Bilateral basilar atelectasis or infiltrate. No pulmonary edema. Mild distended bowel loops in right abdomen suspicious for ileus, partial obstruction or enteritis. No free abdominal air.   Electronically Signed   By: Lahoma Crocker M.D.   On: 01/05/2014 10:22   Dg Swallowing Func-speech Pathology  01/09/2014   Katherene Ponto Deblois, CCC-SLP     01/09/2014  1:50 PM Objective Swallowing Evaluation: Modified Barium Swallowing Study   Patient Details  Name: MALEA SWILLING MRN: [id number] Date of Birth: 1919/04/30  Today's Date: 01/09/2014 Time: 4462-8638 SLP Time Calculation (min): 33 min  Past Medical History:  Past Medical History  Diagnosis Date  . Hypertension   . Hypercholesteremia   . Pelvis fracture   . Kidney stones   . Hyperglycemia   . Diabetes mellitus without complication   . CVA (cerebral infarction)   . Hypoxia    Past Surgical History:  Past Surgical History  Procedure Laterality Date  . Abdominal hysterectomy     HPI:  From [name].  She was brought in after being  found confused and less responsive.  For the  last several weeks,  patient has been getting weaker per daughter- unable to use  walker to get to the bathroom.  She does endorse SOB but no  fever/chills, no trouble eating, no dysuria but is very hard of  hearing.  In the ER, she was found to have a PNA and was in  a fib  with RVR- family denies history.  She was also hypotensive but BP  responded to IVF.  Family confirms she is a DNR.  In the ER she  was given abx for [facility] and cardizem gtt for atrial  fibrillation.     Assessment / Plan / Recommendation Clinical Impression  Dysphagia Diagnosis: Moderate oral phase dysphagia;Moderate  pharyngeal phase dysphagia;Suspected primary esophageal dysphagia Clinical impression: Pt demonstrates a moderate oral dysphagia  with poor control of bolus. With small sips the pt holds bolus  prior to initiating transit, with large cup sips there is  sometimes significant anterior and posterior bolus loss, in some  cases leading to increased aspiration. At this time, teaspoon  presentation is best. Oropharyngeal phase characterized by  delayed airway protection with all liquid boluses reaching the  cords prior to the swallow. Nectar thick liquids are most easily  sensed and ejected from vestibule (thin liquids are silently  aspirated). Recommend dys 1 (puree) and nectar thick liquids  given via teaspoon with encouragement for an occasional throat  clear. There is not much of a difference between honey and  nectar, but if sensed aspiration events are frequently observed,  downgrade to honey. Aspiration risk will continue to be moderate  to high due to fluctuating mentation and overall decompensation  as well as signs of a probable esophageal dysphagia as well  (stasis surging up to proximal esophagus).     Treatment Recommendation  Therapy as outlined in treatment plan below    Diet Recommendation Dysphagia 1 (Puree);Nectar-thick liquid   Liquid Administration via: Spoon Medication Administration: Crushed with puree Supervision:  Staff to assist with self feeding;Full  supervision/cueing for compensatory strategies Compensations: Slow rate;Small sips/bites;Clear throat  intermittently Postural Changes and/or Swallow Maneuvers: Seated upright 90  degrees;Upright 30-60 min after meal    Other  Recommendations Oral Care Recommendations: Oral care BID Other Recommendations: Order thickener from pharmacy   Follow Up Recommendations  Skilled Nursing facility    Frequency and Duration min 2x/week  2 weeks   Pertinent Vitals/Pain NA    SLP Swallow Goals     General HPI: From [name] long [facility].  She was brought in  after being found confused and less responsive.  For the last  several weeks, patient has been getting weaker per daughter-  unable to use walker to get to the bathroom.  She does endorse  SOB but no fever/chills, no trouble eating, no dysuria but is  very hard of hearing.  In the ER, she was found to have a PNA and  was in a fib with RVR- family denies history.  She was also  hypotensive but BP responded to IVF.  Family confirms she is a  DNR.  In the ER she was given abx for [facility] and cardizem gtt for  atrial fibrillation. Type of Study: Modified Barium Swallowing Study Reason for Referral: Objectively evaluate swallowing function Previous Swallow Assessment: none found Diet Prior to this Study: NPO Temperature Spikes Noted: No Respiratory Status: Nasal cannula History of Recent Intubation: No Behavior/Cognition: Alert;Cooperative;Confused Oral Cavity - Dentition: Edentulous Oral Motor / Sensory Function: Impaired - see Bedside swallow  eval Self-Feeding Abilities: Total assist Patient Positioning: Upright in chair Baseline Vocal Quality: Clear;Low vocal intensity Volitional Cough: Weak Volitional Swallow: Able to elicit Anatomy: Within functional limits Pharyngeal Secretions: Not observed secondary MBS    Reason for Referral Objectively evaluate swallowing function   Oral Phase Oral Preparation/Oral Phase Oral Phase: Impaired  Oral -  Honey Oral - Honey Teaspoon: Left anterior bolus loss;Weak lingual  manipulation;Holding of bolus;Delayed oral transit Oral - Nectar Oral - Nectar Teaspoon: Left anterior bolus loss;Weak lingual  manipulation;Holding of bolus;Delayed oral transit Oral - Nectar Cup: Left anterior bolus loss;Weak lingual  manipulation;Holding of bolus;Delayed oral transit Oral - Nectar Straw: Left anterior bolus loss;Weak lingual  manipulation;Holding of bolus;Delayed oral transit Oral - Thin Oral - Thin Teaspoon: Left anterior bolus loss;Weak lingual  manipulation;Holding of bolus;Delayed oral transit Oral - Thin Cup: Left anterior bolus loss;Weak lingual  manipulation;Holding of bolus;Delayed oral transit Oral - Solids Oral - Puree: Left anterior bolus loss;Weak lingual  manipulation;Holding of bolus;Delayed oral transit Oral - Pill: Left anterior bolus loss;Weak lingual  manipulation;Holding of bolus;Delayed oral transit   Pharyngeal Phase Pharyngeal Phase Pharyngeal Phase: Impaired Pharyngeal - Honey Pharyngeal - Honey Teaspoon: Delayed swallow initiation;Premature  spillage to pyriform sinuses;Penetration/Aspiration before  swallow;Pharyngeal residue - valleculae;Pharyngeal residue -  pyriform sinuses Penetration/Aspiration details (honey teaspoon): Material enters  airway, CONTACTS cords then ejected out;Material enters airway,  remains ABOVE vocal cords and not ejected out;Material enters  airway, CONTACTS cords and not ejected out Pharyngeal - Nectar Pharyngeal - Nectar Teaspoon: Delayed swallow  initiation;Premature spillage to pyriform  sinuses;Penetration/Aspiration before swallow;Pharyngeal residue  - valleculae;Pharyngeal residue - pyriform sinuses Penetration/Aspiration details (nectar teaspoon): Material enters  airway, CONTACTS cords and not ejected out;Material enters  airway, CONTACTS cords then ejected out;Material enters airway,  remains ABOVE vocal cords and not ejected out;Material enters  airway, passes  BELOW cords then ejected out Pharyngeal - Nectar Cup: Delayed swallow initiation;Premature  spillage to pyriform sinuses;Penetration/Aspiration before  swallow;Pharyngeal residue - valleculae;Pharyngeal residue -  pyriform sinuses Penetration/Aspiration details (nectar cup): Material enters  airway, remains ABOVE vocal cords and not ejected out;Material  enters airway, CONTACTS cords then ejected out;Material enters  airway, remains ABOVE vocal cords then ejected out Pharyngeal - Nectar Straw: Delayed swallow initiation;Premature  spillage to pyriform sinuses;Penetration/Aspiration before  swallow;Pharyngeal residue - valleculae;Pharyngeal residue -  pyriform sinuses Penetration/Aspiration details (nectar straw): Material enters  airway, remains ABOVE vocal cords and not ejected out;Material  enters airway, CONTACTS cords then ejected out Pharyngeal - Thin Pharyngeal - Thin Teaspoon: Delayed swallow initiation;Premature  spillage to pyriform sinuses;Penetration/Aspiration before  swallow;Moderate aspiration Penetration/Aspiration details (thin teaspoon): Material enters  airway, passes BELOW cords without attempt by patient to eject  out (silent aspiration) Pharyngeal - Thin Cup: Delayed swallow initiation;Premature  spillage to pyriform sinuses;Penetration/Aspiration before  swallow;Moderate aspiration Penetration/Aspiration details (thin cup): Material enters  airway, passes BELOW cords and not ejected out despite cough  attempt by patient Pharyngeal - Solids Pharyngeal - Puree: Delayed swallow initiation;Premature spillage  to valleculae Pharyngeal - Pill: Delayed swallow initiation;Premature spillage  to valleculae  Cervical Esophageal Phase    GO    Cervical Esophageal Phase Cervical Esophageal Phase: Impaired Cervical Esophageal Phase - Comment Cervical Esophageal Comment: trace back flow, decreased UES  opening with mild pyriform residue        Herbie Baltimore, MA CCC-SLP [phone]  [place], Katherene Ponto  01/09/2014, 1:48 PM     Microbiology: No results found for this or any previous visit (from the past 240 hour(s)).   Labs: Basic Metabolic Panel:  Recent Labs Lab 01/11/14 0615 01/12/14 0438 01/13/14 0620 01/14/14 0605 01/15/14 0620  NA 145 150* 148* 148* 138  K 3.6* 4.1 4.0 4.1 3.7  CL 99 102 101 101 91*  CO2 37* 38* 39* 39* 39*  GLUCOSE 110* 142* 130* 155* 142*  BUN 26* $Remov'22 21 21 17  'AHUjvu$ CREATININE 0.76 0.72 0.69 0.65 0.58  CALCIUM 9.2 9.4 9.1 9.3 9.0   Liver Function Tests: No results found for this basename: AST, ALT, ALKPHOS, BILITOT, PROT, ALBUMIN,  in the last 168 hours No results found for this basename: LIPASE, AMYLASE,  in the last 168 hours No results found for this basename: AMMONIA,  in the last 168 hours CBC:  Recent Labs Lab 01/10/14 0429 01/11/14 0615  WBC 7.6 7.9  HGB 11.4* 11.5*  HCT 38.5 38.8  MCV 82.4 83.1  PLT 333 317   Cardiac Enzymes: No results found for this basename: CKTOTAL, CKMB, CKMBINDEX, TROPONINI,  in the last 168 hours BNP: BNP (last 3 results)  Recent Labs  08/23/13 2224  PROBNP 184.6   CBG:  Recent Labs Lab 01/15/14 1130 01/15/14 1634 01/15/14 2037 01/16/14 0755 01/16/14 1251  GLUCAP 165* 171* 79 105* 127*    Additional labs: 1. Influenza panel PCR: Negative  2. Urine Legionella and streptococcal antigen: Negative  3.  2-D echo 01/06/14: Study Conclusions  - Left ventricle: The cavity size was normal. There was mild focal basal hypertrophy of the septum. Systolic function was vigorous. The estimated ejection fraction was in the range of 65% to 70%. Wall motion was normal; there were no regional wall motion abnormalities. Doppler parameters are consistent with high ventricular filling pressure. - Aortic valve: Mild regurgitation. Valve area: 1.88cm^2(VTI). Valve area: 2.04cm^2 (Vmax). - Mitral valve: Calcified annulus. The findings are consistent with mild stenosis. Valve area by pressure half-time: 2.06cm^2.  Valve area by continuity equation (using LVOT flow): 1.62cm^2. - Left atrium: The atrium was mildly dilated. - Right atrium: The atrium was mildly dilated. - Pulmonary arteries: Systolic pressure was mildly increased. PA peak pressure: 27mm Hg (S). Impressions:  - Compared to 01/05/13, no significant change.     Signed:  Vernell Leep, MD, FACP, FHM. [facility] Pager [phone]  If 7PM-7AM, please contact night-coverage [email or web address] Password [email or web address] 01/16/2014, 1:04 PM

## 2014-01-16 NOTE — Progress Notes (Signed)
SLP Cancellation Note  Patient Details Name: Savannah [name] MRN: [id number] DOB: 05/11/1919   Cancelled treatment:       Reason Eval/Treat Not Completed: Other (comment). Pt has transitioned to comfort care, Family has agreed for her to be given POs as desired. SLP will sign off, please call if there are further needs.   Harlon DittyBonnie Makensey Rego, MA CCC-SLP [phone]   Claudine MoutonDeBlois, Chantrell Apsey Caroline 01/16/2014, 2:02 PM

## 2014-01-17 ENCOUNTER — Encounter: Payer: Self-pay | Admitting: Adult Health

## 2014-01-17 ENCOUNTER — Non-Acute Institutional Stay (SKILLED_NURSING_FACILITY): Payer: PRIVATE HEALTH INSURANCE | Admitting: Adult Health

## 2014-01-17 DIAGNOSIS — I5033 Acute on chronic diastolic (congestive) heart failure: Secondary | ICD-10-CM

## 2014-01-17 DIAGNOSIS — I639 Cerebral infarction, unspecified: Secondary | ICD-10-CM

## 2014-01-17 DIAGNOSIS — I6350 Cerebral infarction due to unspecified occlusion or stenosis of unspecified cerebral artery: Secondary | ICD-10-CM

## 2014-01-17 DIAGNOSIS — I509 Heart failure, unspecified: Secondary | ICD-10-CM

## 2014-01-17 DIAGNOSIS — J9610 Chronic respiratory failure, unspecified whether with hypoxia or hypercapnia: Secondary | ICD-10-CM

## 2014-01-17 DIAGNOSIS — R1319 Other dysphagia: Secondary | ICD-10-CM

## 2014-01-17 DIAGNOSIS — F0390 Unspecified dementia without behavioral disturbance: Secondary | ICD-10-CM

## 2014-01-17 DIAGNOSIS — I10 Essential (primary) hypertension: Secondary | ICD-10-CM

## 2014-01-17 NOTE — Progress Notes (Signed)
Patient ID: Savannah [name], female   DOB: 11/05/1919, 80 y.o.   MRN: [id number]     No Known Allergies   Chief Complaint  Patient presents with  . Hospitalization Follow-up    HPI:  She had a complicated hospitalization due to respiratory failure; chf; new cva; afib. Her status continued to decline. Palliative care was brought in during her hospitalization in order to help her family make end of life decisions. Her family has decided upon hospice care for her. She is here for comfort care at her end of life. She is unable to participate in the hpi or ros. She does appear to be in pain; but is not in acute distress.    Past Medical History  Diagnosis Date  . Hypertension   . Hypercholesteremia   . Pelvis fracture   . Kidney stones   . Hyperglycemia   . Diabetes mellitus without complication   . CVA (cerebral infarction)   . Hypoxia     Past Surgical History  Procedure Laterality Date  . Abdominal hysterectomy      VITAL SIGNS BP 133/70  Pulse 68  Ht 5\' 3"  (1.6 m)  Wt 178 lb (80.74 kg)  BMI 31.54 kg/m2   Patient's Medications  New Prescriptions   No medications on file  Previous Medications   ALBUTEROL (PROVENTIL) (2.5 MG/3ML) 0.083% NEBULIZER SOLUTION    Take 3 mLs (2.5 mg total) by nebulization every 2 (two) hours as needed for wheezing or shortness of breath.   BISACODYL (DULCOLAX) 10 MG SUPPOSITORY    Place 1 suppository (10 mg total) rectally daily as needed for moderate constipation.   FEEDING SUPPLEMENT, ENSURE, (ENSURE) PUDG    Take 1 Container by mouth 3 (three) times daily between meals.   IPRATROPIUM-ALBUTEROL (DUONEB) 0.5-2.5 (3) MG/3ML SOLN    Take 3 mLs by nebulization 3 (three) times daily.    MORPHINE SULFATE (MORPHINE CONCENTRATE) 10 MG / 0.5 ML CONCENTRATED SOLUTION    Place 0.25 mLs (5 mg total) under the tongue every 4 (four) hours as needed for shortness of breath (pain).   NITROGLYCERIN (NITROSTAT) 0.4 MG SL TABLET    Place 1 tablet (0.4 mg total)  under the tongue every 5 (five) minutes as needed for chest pain.   NON FORMULARY    Place 2 L into the nose continuous. 02  Modified Medications   Modified Medication Previous Medication   FOOD THICKENER (THICK IT) POWD food thickener (THICK IT) POWD      Take 1 Container by mouth as needed. NECTAR THICK    Oral, As needed, to thicken fluids  Discontinued Medications   DOCUSATE SODIUM (DSS) 100 MG CAPS    Take 100 mg by mouth 2 (two) times daily.   METOPROLOL TARTRATE (LOPRESSOR) 25 MG TABLET    Take 1 tablet (25 mg total) by mouth 2 (two) times daily.   OMEPRAZOLE (PRILOSEC) 20 MG CAPSULE    Take 20 mg by mouth daily.    SIGNIFICANT DIAGNOSTIC EXAMS  01-05-14: acute abdomen: Bilateral basilar atelectasis or infiltrate. No pulmonary edema. Mild distended bowel loops in right abdomen suspicious for ileus, partial obstruction or enteritis. No free abdominal air.   01-06-14: chest x-ray: Progression of bibasilar atelectasis/infiltrate and bilateral effusions. Mild fluid overload also possible  01-06-14: TEE: Left ventricle: The cavity size was normal. There was mild focal basal hypertrophy of the septum. Systolic function was vigorous. The estimated ejection fraction was in the range of 65% to 70%. Wall motion  was normal; there were noregional wall motion abnormalities. Doppler parameters are consistent with high ventricular filling pressure. - Aortic valve: Mild regurgitation.  - Mitral valve: Calcified annulus. The findings are consistent with mild stenosis.  - Left atrium: The atrium was mildly dilated. - Right atrium: The atrium was mildly dilated. - Pulmonary arteries: Systolic pressure was mildly increased.   01-10-14: ct of head: Findings consistent with  larger right MCA territory infarction. Mild mass effect upon the right ventricle. No evidence of hemorrhage.  01-10-14: chest x-ray: There has been mild interval improvement in the appearance of the pulmonary interstitium bilaterally. There  remains atelectasis or pneumonia at the left lung base. The right lung base is clearer today.  01-15-14: chest x-ray; 1. Decreasing lung volumes with worsening bibasilar (left greater than right) atelectasis and/or consolidation. 2. Increasing moderate left pleural effusion and small right pleural Effusion. 3. Cardiomegaly with evidence of mild congestive heart failure, as above. 4. Atherosclerosis. 5. Large hiatal hernia.     LABS REVIEWED:   01-05-14: wbc 10.2; hgb 12.4; hct 39.8; mcv 80.4 plt 233; glucose 134; bun 13; creat 0.69; k+4.5; na++140; liver normal albumin 2.5; urine culture: no growth 01-10-14: wbc 7.6;hgb 11.4; hct 38.5; mcv 82.4;plt 333; glucose 117; bun 26; creat 0.82; k+3.1; na++148 C02 38 01-11-14: wbc 7.9; hgb 11.5; hct 38.8; mcv 83.1; plt 317; glucose 110; bun 26; creat 0.76; k+3.6; na++145; C02 37 01-15-14: glucose 142; bun 17; creat 0.58; k+3.7; na++ 138; C02 39      Review of Systems  Unable to perform ROS   Physical Exam  Constitutional: No distress.  She appears to be in pain  Neck: Neck supple. No JVD present.  Cardiovascular: Normal rate, regular rhythm and intact distal pulses.   Respiratory: Effort normal. No respiratory distress. She has no wheezes.  Breath sounds diminished throughout  GI: Soft. Bowel sounds are normal. She exhibits no distension. There is no tenderness.  Musculoskeletal: She exhibits no edema.  Is able to move extremities  Neurological:  Will respond to name  Skin: Skin is warm and dry. She is not diaphoretic.      ASSESSMENT/ PLAN:  1. Chronic respiratory failure; her status at this time is without change. She is on chronic 02 at 2 liters. Is taking duoneb three times daily and ha albuterol neb every 2 hours as needed.  She has roxanol 5 mg every 4 hours as needed for pain or distress will not make changes at this time and will monitor her status.   2. Diastolic heart failure: she is presently without change in status; she is not on  medications at this time. She has ntg as needed for chest pain. Will continue to monitor her status.   3. Dementia: she continues to decline; will respond to name at times; is presently not on medications; will not make changes and will monitor her status.   4. CVA; no change in her status; will continue to focus her care upon comfort and will monitor her status.   5. Hypertension: is stable is not on medications will not make changes will monitor her status.   6. Dysphagia: no outward sign of aspiration present; will continue nectar thick liquids at this time.    Time spent with patient 50 minutes.      Synthia Innocent NP [facility]  Contact [phone] Monday through Friday 8am- 5pm  After hours call [phone]

## 2014-01-20 ENCOUNTER — Non-Acute Institutional Stay (SKILLED_NURSING_FACILITY): Payer: PRIVATE HEALTH INSURANCE | Admitting: Internal Medicine

## 2014-01-20 ENCOUNTER — Other Ambulatory Visit: Payer: Self-pay | Admitting: *Deleted

## 2014-01-20 DIAGNOSIS — R1319 Other dysphagia: Secondary | ICD-10-CM

## 2014-01-20 DIAGNOSIS — J9610 Chronic respiratory failure, unspecified whether with hypoxia or hypercapnia: Secondary | ICD-10-CM

## 2014-01-20 DIAGNOSIS — I509 Heart failure, unspecified: Secondary | ICD-10-CM

## 2014-01-20 DIAGNOSIS — I5033 Acute on chronic diastolic (congestive) heart failure: Secondary | ICD-10-CM

## 2014-01-20 DIAGNOSIS — L299 Pruritus, unspecified: Secondary | ICD-10-CM

## 2014-01-20 DIAGNOSIS — R52 Pain, unspecified: Secondary | ICD-10-CM

## 2014-01-20 MED ORDER — MORPHINE SULFATE (CONCENTRATE) 20 MG/ML PO SOLN: ORAL | Status: DC

## 2014-01-20 NOTE — Telephone Encounter (Signed)
[facility] 

## 2014-02-05 DIAGNOSIS — R52 Pain, unspecified: Secondary | ICD-10-CM | POA: Insufficient documentation

## 2014-02-05 DIAGNOSIS — L299 Pruritus, unspecified: Secondary | ICD-10-CM | POA: Insufficient documentation

## 2014-02-05 NOTE — Progress Notes (Signed)
Patient ID: Savannah [name], female   DOB: December 05, 1918, 80 y.o.   MRN: [id number]     [facility]   PCP: Oneal Grout, MD  Code Status: DNR  No Known Allergies  Chief Complaint: new admission  HPI:  80 y/o female patient was in the hospital from 01/05/14- 01/16/14 with acute respiratory failure in setting of pneumonia and afib with RVR. She had acute necephalopathy and family decided on comfort care for her. She is in the facility with hospice care being provided. She is seen in her room today. She appears to be in pain and also has been itching. As per staff, she appears uncomfortable since this am. With her decreased responsiveness and dementia, unable to obtain any ROS  Review of Systems:  Unable to obtain ROS  Past Medical History  Diagnosis Date  . Hypertension   . Hypercholesteremia   . Pelvis fracture   . Kidney stones   . Hyperglycemia   . Diabetes mellitus without complication   . CVA (cerebral infarction)   . Hypoxia    Past Surgical History  Procedure Laterality Date  . Abdominal hysterectomy     Social History:   reports that she has quit smoking. She does not have any smokeless tobacco history on file. She reports that she does not drink alcohol or use illicit drugs.  Family History  Problem Relation Age of Onset  . Heart disease Mother   . Heart disease Father   . Diabetes Sister   . Stroke Brother     Medications: Patient's Medications  New Prescriptions   No medications on file  Previous Medications   ALBUTEROL (PROVENTIL) (2.5 MG/3ML) 0.083% NEBULIZER SOLUTION    Take 3 mLs (2.5 mg total) by nebulization every 2 (two) hours as needed for wheezing or shortness of breath.   BISACODYL (DULCOLAX) 10 MG SUPPOSITORY    Place 1 suppository (10 mg total) rectally daily as needed for moderate constipation.   FEEDING SUPPLEMENT, ENSURE, (ENSURE) PUDG    Take 1 Container by mouth 3 (three) times daily between meals.   FOOD THICKENER (THICK IT) POWD     Take 1 Container by mouth as needed. NECTAR THICK   IPRATROPIUM-ALBUTEROL (DUONEB) 0.5-2.5 (3) MG/3ML SOLN    Take 3 mLs by nebulization 3 (three) times daily.    MORPHINE (ROXANOL) 20 MG/ML CONCENTRATED SOLUTION    Take 0.1ml by mouth every 4 hours as needed for pain   MORPHINE SULFATE (MORPHINE CONCENTRATE) 10 MG / 0.5 ML CONCENTRATED SOLUTION    Place 0.25 mLs (5 mg total) under the tongue every 4 (four) hours as needed for shortness of breath (pain).   NITROGLYCERIN (NITROSTAT) 0.4 MG SL TABLET    Place 1 tablet (0.4 mg total) under the tongue every 5 (five) minutes as needed for chest pain.   NON FORMULARY    Place 2 L into the nose continuous. 02  Modified Medications   No medications on file  Discontinued Medications   No medications on file     Physical Exam: Filed Vitals:   01/20/14 1751  BP: 140/75  Pulse: 88  Temp: 98.3 F (36.8 C)  Resp: 18  SpO2: 95%    General- elderly female in mild distress Head- atraumatic, normocephalic Eyes- PERRLA, EOMI, no pallor, no icterus, no discharge Neck- no lymphadenopathy Cardiovascular- irregular heart rate Respiratory- poor air entry at bases, on o2 by nasal canula Abdomen- bowel sounds present, soft, non tender, foley in place Musculoskeletal- no  edema Neurological- somnolent Skin- warm and dry, no diaphoresis  Labs reviewed: Basic Metabolic Panel:  Recent Labs  44/11/201/06/15 0620 01/14/14 0605 01/15/14 0620  NA 148* 148* 138  K 4.0 4.1 3.7  CL 101 101 91*  CO2 39* 39* 39*  GLUCOSE 130* 155* 142*  BUN 21 21 17   CREATININE 0.69 0.65 0.58  CALCIUM 9.1 9.3 9.0   Liver Function Tests:  Recent Labs  08/23/13 1705 01/05/14 0936  AST 18 19  ALT 13 11  ALKPHOS 52 63  BILITOT 0.4 0.5  PROT 6.1 6.6  ALBUMIN 2.9* 2.5*   No results found for this basename: LIPASE, AMYLASE,  in the last 8760 hours No results found for this basename: AMMONIA,  in the last 8760 hours CBC:  Recent Labs  08/23/13 1705  08/26/13 0511  01/05/14 0936 01/10/14 0429 01/11/14 0615  WBC 7.0  < > 9.7 10.2 7.6 7.9  NEUTROABS 4.5  --  8.9* 7.3  --   --   HGB 11.5*  < > 9.8* 12.4 11.4* 11.5*  HCT 38.3  < > 32.7* 39.8 38.5 38.8  MCV 74.7*  < > 75.3* 80.4 82.4 83.1  PLT 264  < > 264 233 333 317  < > = values in this interval not displayed. Cardiac Enzymes: No results found for this basename: CKTOTAL, CKMB, CKMBINDEX, TROPONINI,  in the last 8760 hours BNP: No components found with this basename: POCBNP,  CBG:  Recent Labs  01/15/14 2037 01/16/14 0755 01/16/14 1251  GLUCAP 79 105* 127*    Radiological Exams: Ct Head Wo Contrast  01/10/2014   CLINICAL DATA:  Altered mental status, lethargy  EXAM: CT HEAD WITHOUT CONTRAST  TECHNIQUE: Contiguous axial images were obtained from the base of the skull through the vertex without intravenous contrast.  COMPARISON:  CT HEAD W/O CM dated 08/23/2013  FINDINGS: There is a large geographic region of low attenuation within the right frontotemporal lobe involving the white matter and cortical gray matter most consistent with a right MCA acute infarction. There is no evidence of hemorrhage. There is mild compression of the anterior horn of the right ventricle from edema from the presumed infarction.  The generalized cortical atrophy. Periventricular subcortical white matter hypodensities. Paranasal sinuses and mastoid air cells are clear.  IMPRESSION: Findings consistent with  larger right MCA territory infarction.  Mild mass effect upon the right ventricle.  No evidence of hemorrhage.  Findings conveyed toCrystal Mefford, RNon 01/10/2014  at12:26.   Electronically Signed   By: Genevive BiStewart  Edmunds M.D.   On: 01/10/2014 12:27   Dg Chest Port 1 View  01/15/2014   CLINICAL DATA:  Dyspnea.  EXAM: PORTABLE CHEST - 1 VIEW  COMPARISON:  Chest x-ray 01/12/2014.  FINDINGS: Lung volumes are very low. There are bibasilar opacities (left greater than right), which have increased, and are compatible with worsening  areas of atelectasis and/or consolidation. Possible small right and moderate left-sided pleural effusions. Pulmonary venous congestion. Indistinct interstitial markings may suggest mild interstitial pulmonary edema. Enlargement of the cardiopericardial silhouette may in part be positional, and in part related to the presence of a large hiatal hernia (retrocardiac lucency again noted, compatible with a large hiatal hernia). Mediastinal contours are grossly distorted by patient positioning. Atherosclerosis in the thoracic aorta.  IMPRESSION: 1. Decreasing lung volumes with worsening bibasilar (left greater than right) atelectasis and/or consolidation. 2. Increasing moderate left pleural effusion and small right pleural effusion. 3. Cardiomegaly with evidence of mild congestive heart failure,  as above. 4. Atherosclerosis. 5. Large hiatal hernia.   Electronically Signed   By: Trudie Reed M.D.   On: 01/15/2014 07:49   Dg Chest Port 1 View  01/12/2014   CLINICAL DATA:  Dyspnea, HCAP  EXAM: PORTABLE CHEST - 1 VIEW  COMPARISON:  DG CHEST 1V PORT dated 01/06/2014  FINDINGS: The lungs are adequately inflated. The left hemidiaphragm remains obscured and the retrocardiac region on the left remains dense. The pulmonary interstitial markings are less conspicuous. The cardiopericardial silhouette remains enlarged. The pulmonary vascularity is not clearly engorged. There is no significant pleural effusion on the right. Pleural fluid on the left cannot be excluded. The observed portions of the bony thorax exhibit no acute abnormalities.  IMPRESSION: There has been mild interval improvement in the appearance of the pulmonary interstitium bilaterally. There remains atelectasis or pneumonia at the left lung base. The right lung base is clearer today.   Electronically Signed   By: David  [place]   On: 01/12/2014 08:32   Dg Chest Port 1 View  01/06/2014   CLINICAL DATA:  Hypoxia  EXAM: PORTABLE CHEST - 1 VIEW  COMPARISON:   01/05/2014  FINDINGS: Progression of bibasilar airspace disease. Progression of small bilateral effusions.  Cardiac enlargement with mild vascular congestion. Apical scarring bilaterally.  IMPRESSION: Progression of bibasilar atelectasis/infiltrate and bilateral effusions. Mild fluid overload also possible.   Electronically Signed   By: Marlan Palau M.D.   On: 01/06/2014 12:55   Dg Abd Acute W/chest  01/05/2014   CLINICAL DATA:  Atrial fibrillation, question free abdominal air  EXAM: ACUTE ABDOMEN SERIES (ABDOMEN 2 VIEW & CHEST 1 VIEW)  COMPARISON:  08/24/2013  FINDINGS: Cardiomegaly again noted. There is bilateral basilar atelectasis or infiltrate. No pulmonary edema.  No free abdominal air is noted. Mild distended bowel loops in right abdomen suspicious for ileus, partial obstruction or enteritis.  IMPRESSION: Bilateral basilar atelectasis or infiltrate. No pulmonary edema. Mild distended bowel loops in right abdomen suspicious for ileus, partial obstruction or enteritis. No free abdominal air.   Electronically Signed   By: Natasha Mead M.D.   On: 01/05/2014 10:22   Dg Swallowing Func-speech Pathology  01/09/2014   Riley Nearing [place]blois, CCC-SLP     01/09/2014  1:50 PM Objective Swallowing Evaluation: Modified Barium Swallowing Study   Patient Details  Name: ZRIYAH KOPPLIN MRN: [id number] Date of Birth: 03-16-1919  Today's Date: 01/09/2014 Time: 4098-1191 SLP Time Calculation (min): 33 min  Past Medical History:  Past Medical History  Diagnosis Date  . Hypertension   . Hypercholesteremia   . Pelvis fracture   . Kidney stones   . Hyperglycemia   . Diabetes mellitus without complication   . CVA (cerebral infarction)   . Hypoxia    Past Surgical History:  Past Surgical History  Procedure Laterality Date  . Abdominal hysterectomy     HPI:  From [name] long term [facility].  She was brought in after being  found confused and less responsive.  For the last several weeks,  patient has been getting weaker per daughter- unable  to use  walker to get to the bathroom.  She does endorse SOB but no  fever/chills, no trouble eating, no dysuria but is very hard of  hearing.  In the ER, she was found to have a PNA and was in a fib  with RVR- family denies history.  She was also hypotensive but BP  responded to IVF.  Family confirms she is a DNR.  In the ER she  was given abx for [facility] and cardizem gtt for atrial  fibrillation.     Assessment / Plan / Recommendation Clinical Impression  Dysphagia Diagnosis: Moderate oral phase dysphagia;Moderate  pharyngeal phase dysphagia;Suspected primary esophageal dysphagia Clinical impression: Pt demonstrates a moderate oral dysphagia  with poor control of bolus. With small sips the pt holds bolus  prior to initiating transit, with large cup sips there is  sometimes significant anterior and posterior bolus loss, in some  cases leading to increased aspiration. At this time, teaspoon  presentation is best. Oropharyngeal phase characterized by  delayed airway protection with all liquid boluses reaching the  cords prior to the swallow. Nectar thick liquids are most easily  sensed and ejected from vestibule (thin liquids are silently  aspirated). Recommend dys 1 (puree) and nectar thick liquids  given via teaspoon with encouragement for an occasional throat  clear. There is not much of a difference between honey and  nectar, but if sensed aspiration events are frequently observed,  downgrade to honey. Aspiration risk will continue to be moderate  to high due to fluctuating mentation and overall decompensation  as well as signs of a probable esophageal dysphagia as well  (stasis surging up to proximal esophagus).     Treatment Recommendation  Therapy as outlined in treatment plan below    Diet Recommendation Dysphagia 1 (Puree);Nectar-thick liquid   Liquid Administration via: Spoon Medication Administration: Crushed with puree Supervision: Staff to assist with self feeding;Full  supervision/cueing for compensatory  strategies Compensations: Slow rate;Small sips/bites;Clear throat  intermittently Postural Changes and/or Swallow Maneuvers: Seated upright 90  degrees;Upright 30-60 min after meal    Other  Recommendations Oral Care Recommendations: Oral care BID Other Recommendations: Order thickener from pharmacy   Follow Up Recommendations  Skilled Nursing facility    Frequency and Duration min 2x/week  2 weeks   Pertinent Vitals/Pain NA    SLP Swallow Goals     General HPI: From [name] long term [facility]. She was brought in  after being found confused and less responsive.  For the last  several weeks, patient has been getting weaker per daughter-  unable to use walker to get to the bathroom.  She does endorse  SOB but no fever/chills, no trouble eating, no dysuria but is  very hard of hearing.  In the ER, she was found to have a PNA and  was in a fib with RVR- family denies history.  She was also  hypotensive but BP responded to IVF.  Family confirms she is a  DNR.  In the ER she was given abx for [facility] and cardizem gtt for  atrial fibrillation. Type of Study: Modified Barium Swallowing Study Reason for Referral: Objectively evaluate swallowing function Previous Swallow Assessment: none found Diet Prior to this Study: NPO Temperature Spikes Noted: No Respiratory Status: Nasal cannula History of Recent Intubation: No Behavior/Cognition: Alert;Cooperative;Confused Oral Cavity - Dentition: Edentulous Oral Motor / Sensory Function: Impaired - see Bedside swallow  eval Self-Feeding Abilities: Total assist Patient Positioning: Upright in chair Baseline Vocal Quality: Clear;Low vocal intensity Volitional Cough: Weak Volitional Swallow: Able to elicit Anatomy: Within functional limits Pharyngeal Secretions: Not observed secondary MBS    Reason for Referral Objectively evaluate swallowing function   Oral Phase Oral Preparation/Oral Phase Oral Phase: Impaired Oral - Honey Oral - Honey Teaspoon: Left anterior bolus loss;Weak lingual   manipulation;Holding of bolus;Delayed oral transit Oral - Nectar Oral - Nectar Teaspoon: Left anterior bolus loss;Weak  lingual  manipulation;Holding of bolus;Delayed oral transit Oral - Nectar Cup: Left anterior bolus loss;Weak lingual  manipulation;Holding of bolus;Delayed oral transit Oral - Nectar Straw: Left anterior bolus loss;Weak lingual  manipulation;Holding of bolus;Delayed oral transit Oral - Thin Oral - Thin Teaspoon: Left anterior bolus loss;Weak lingual  manipulation;Holding of bolus;Delayed oral transit Oral - Thin Cup: Left anterior bolus loss;Weak lingual  manipulation;Holding of bolus;Delayed oral transit Oral - Solids Oral - Puree: Left anterior bolus loss;Weak lingual  manipulation;Holding of bolus;Delayed oral transit Oral - Pill: Left anterior bolus loss;Weak lingual  manipulation;Holding of bolus;Delayed oral transit   Pharyngeal Phase Pharyngeal Phase Pharyngeal Phase: Impaired Pharyngeal - Honey Pharyngeal - Honey Teaspoon: Delayed swallow initiation;Premature  spillage to pyriform sinuses;Penetration/Aspiration before  swallow;Pharyngeal residue - valleculae;Pharyngeal residue -  pyriform sinuses Penetration/Aspiration details (honey teaspoon): Material enters  airway, CONTACTS cords then ejected out;Material enters airway,  remains ABOVE vocal cords and not ejected out;Material enters  airway, CONTACTS cords and not ejected out Pharyngeal - Nectar Pharyngeal - Nectar Teaspoon: Delayed swallow  initiation;Premature spillage to pyriform  sinuses;Penetration/Aspiration before swallow;Pharyngeal residue  - valleculae;Pharyngeal residue - pyriform sinuses Penetration/Aspiration details (nectar teaspoon): Material enters  airway, CONTACTS cords and not ejected out;Material enters  airway, CONTACTS cords then ejected out;Material enters airway,  remains ABOVE vocal cords and not ejected out;Material enters  airway, passes BELOW cords then ejected out Pharyngeal - Nectar Cup: Delayed swallow  initiation;Premature  spillage to pyriform sinuses;Penetration/Aspiration before  swallow;Pharyngeal residue - valleculae;Pharyngeal residue -  pyriform sinuses Penetration/Aspiration details (nectar cup): Material enters  airway, remains ABOVE vocal cords and not ejected out;Material  enters airway, CONTACTS cords then ejected out;Material enters  airway, remains ABOVE vocal cords then ejected out Pharyngeal - Nectar Straw: Delayed swallow initiation;Premature  spillage to pyriform sinuses;Penetration/Aspiration before  swallow;Pharyngeal residue - valleculae;Pharyngeal residue -  pyriform sinuses Penetration/Aspiration details (nectar straw): Material enters  airway, remains ABOVE vocal cords and not ejected out;Material  enters airway, CONTACTS cords then ejected out Pharyngeal - Thin Pharyngeal - Thin Teaspoon: Delayed swallow initiation;Premature spillage to pyriform sinuses;Penetration/Aspiration before  swallow;Moderate aspiration Penetration/Aspiration details (thin teaspoon): Material enters  airway, passes BELOW cords without attempt by patient to eject  out (silent aspiration) Pharyngeal - Thin Cup: Delayed swallow initiation;Premature  spillage to pyriform sinuses;Penetration/Aspiration before  swallow;Moderate aspiration Penetration/Aspiration details (thin cup): Material enters  airway, passes BELOW cords and not ejected out despite cough  attempt by patient Pharyngeal - Solids Pharyngeal - Puree: Delayed swallow initiation;Premature spillage  to valleculae Pharyngeal - Pill: Delayed swallow initiation;Premature spillage  to valleculae  Cervical Esophageal Phase    GO    Cervical Esophageal Phase Cervical Esophageal Phase: Impaired Cervical Esophageal Phase - Comment Cervical Esophageal Comment: trace back flow, decreased UES  opening with mild pyriform residue        Harlon Ditty, MA CCC-SLP [phone]  DeBlois, Riley Nearing 01/09/2014, 1:48 PM     Assessment/Plan  Pain- with her being under  hospice care and appearing in mild distress, will change her morphine to 10 mg q4h prn for pain/ distress for now and reassess  Pruritus- likely with her using of opioids. Will have her on benadryl 25 mg q8h prn itching  Chronic respiratory failure- continue o2 by nasal canula. have increased the dose of morphine and this should help with her breathing as well. Continue duonebs   chf- persists. Off all medication at present. Monitor clinically. Can give prn NTG for chest pain  Dysphagia- nectar thick liquids as  tolerated for now  Patient is progressively declining. Comfort care is the main goal here.   Family/ staff Communication: reviewed care plan with patient and nursing supervisor   Goals of care: comfort care. reviewed care plan with hospice team   Labs/tests ordered- none    Oneal Grout, MD  [facility] [phone] (Monday-Friday 8 am - 5 pm) [phone] (afterhours)

## 2014-02-06 ENCOUNTER — Encounter: Payer: Self-pay | Admitting: Adult Health

## 2014-02-06 ENCOUNTER — Non-Acute Institutional Stay (SKILLED_NURSING_FACILITY): Payer: PRIVATE HEALTH INSURANCE | Admitting: Adult Health

## 2014-02-06 DIAGNOSIS — B029 Zoster without complications: Secondary | ICD-10-CM

## 2014-02-06 NOTE — Progress Notes (Signed)
Patient ID: Savannah [name], female   DOB: 08/14/1919, 80 y.o.   MRN: [id number]     [facility]  No Known Allergies   Chief Complaint  Patient presents with  . Acute Visit    rash right thigh    HPI:  Staff reports that she has a rash on her right outer thigh. The area is red; inflamed with papules present. The area is very painful as well. I have had Dr. Glade Lloyd come and see this rash as well. We both think that this is shingles. Her daughter states she has had this in the past and has also had this area on her neck as well; and has been treated with topical medications for psoriasis. This diagnosis has not been formally given. We will treat this as shingles at this time.    Past Medical History  Diagnosis Date  . Hypertension   . Hypercholesteremia   . Pelvis fracture   . Kidney stones   . Hyperglycemia   . Diabetes mellitus without complication   . CVA (cerebral infarction)   . Hypoxia     Past Surgical History  Procedure Laterality Date  . Abdominal hysterectomy      VITAL SIGNS BP 136/70  Pulse 76  Ht 5\' 3"  (1.6 m)  Wt 181 lb (82.101 kg)  BMI 32.07 kg/m2   Patient's Medications  New Prescriptions   No medications on file  Previous Medications   ALBUTEROL (PROVENTIL) (2.5 MG/3ML) 0.083% NEBULIZER SOLUTION    Take 3 mLs (2.5 mg total) by nebulization every 2 (two) hours as needed for wheezing or shortness of breath.   FEEDING SUPPLEMENT, ENSURE, (ENSURE) PUDG    Take 1 Container by mouth 3 (three) times daily between meals.   FOOD THICKENER (THICK IT) POWD    Take 1 Container by mouth as needed. NECTAR THICK   IPRATROPIUM-ALBUTEROL (DUONEB) 0.5-2.5 (3) MG/3ML SOLN    Take 3 mLs by nebulization 3 (three) times daily.    MORPHINE SULFATE (MORPHINE CONCENTRATE) 10 MG / 0.5 ML CONCENTRATED SOLUTION    Place 0.25 mLs (5 mg total) under the tongue every 4 (four) hours as needed for shortness of breath (pain).   NITROGLYCERIN (NITROSTAT) 0.4 MG SL TABLET    Place 1  tablet (0.4 mg total) under the tongue every 5 (five) minutes as needed for chest pain.   NON FORMULARY    Place 2 L into the nose continuous. 02  Modified Medications   Modified Medication Previous Medication   BISACODYL (DULCOLAX) 10 MG SUPPOSITORY bisacodyl (DULCOLAX) 10 MG suppository      Place 10 mg rectally 3 (three) times a week.    Place 1 suppository (10 mg total) rectally daily as needed for moderate constipation.  Discontinued Medications   MORPHINE (ROXANOL) 20 MG/ML CONCENTRATED SOLUTION    Take 0.72ml by mouth every 4 hours as needed for pain    SIGNIFICANT DIAGNOSTIC EXAMS  01-05-14: acute abdomen: Bilateral basilar atelectasis or infiltrate. No pulmonary edema. Mild distended bowel loops in right abdomen suspicious for ileus, partial obstruction or enteritis. No free abdominal air.   01-06-14: chest x-ray: Progression of bibasilar atelectasis/infiltrate and bilateral effusions. Mild fluid overload also possible  01-06-14: TEE: Left ventricle: The cavity size was normal. There was mild focal basal hypertrophy of the septum. Systolic function was vigorous. The estimated ejection fraction was in the range of 65% to 70%. Wall motion was normal; there were noregional wall motion abnormalities. Doppler parameters are consistent with  high ventricular filling pressure. - Aortic valve: Mild regurgitation.  - Mitral valve: Calcified annulus. The findings are consistent with mild stenosis.  - Left atrium: The atrium was mildly dilated. - Right atrium: The atrium was mildly dilated. - Pulmonary arteries: Systolic pressure was mildly increased.   01-10-14: ct of head: Findings consistent with  larger right MCA territory infarction. Mild mass effect upon the right ventricle. No evidence of hemorrhage.  01-10-14: chest x-ray: There has been mild interval improvement in the appearance of the pulmonary interstitium bilaterally. There remains atelectasis or pneumonia at the left lung base. The right  lung base is clearer today.  01-15-14: chest x-ray; 1. Decreasing lung volumes with worsening bibasilar (left greater than right) atelectasis and/or consolidation. 2. Increasing moderate left pleural effusion and small right pleural Effusion. 3. Cardiomegaly with evidence of mild congestive heart failure, as above. 4. Atherosclerosis. 5. Large hiatal hernia.     LABS REVIEWED:   01-05-14: wbc 10.2; hgb 12.4; hct 39.8; mcv 80.4 plt 233; glucose 134; bun 13; creat 0.69; k+4.5; na++140; liver normal albumin 2.5; urine culture: no growth 01-10-14: wbc 7.6;hgb 11.4; hct 38.5; mcv 82.4;plt 333; glucose 117; bun 26; creat 0.82; k+3.1; na++148 C02 38 01-11-14: wbc 7.9; hgb 11.5; hct 38.8; mcv 83.1; plt 317; glucose 110; bun 26; creat 0.76; k+3.6; na++145; C02 37 01-15-14: glucose 142; bun 17; creat 0.58; k+3.7; na++ 138; C02 39      Review of Systems  Unable to perform ROS   Physical Exam  Constitutional: No distress.  She appears to be in pain  Neck: Neck supple. No JVD present.  Cardiovascular: Normal rate, regular rhythm and intact distal pulses.   Respiratory: Effort normal. No respiratory distress. She has no wheezes.  Breath sounds diminished throughout  GI: Soft. Bowel sounds are normal. She exhibits no distension. There is no tenderness.  Musculoskeletal: She exhibits no edema.  Is able to move extremities  Neurological:  Will respond to name  Skin: Skin is warm and dry. She is not diaphoretic.  Has a small papular rash on her right outer thigh which is inflamed and painful      ASSESSMENT/ PLAN:  1. Shingles: will begin acyclovir liquid 400 mg four times daily for 10 days and prednisone 10 mg for 4 days and will monitor her status.

## 2014-02-17 ENCOUNTER — Non-Acute Institutional Stay (SKILLED_NURSING_FACILITY): Payer: PRIVATE HEALTH INSURANCE | Admitting: Adult Health

## 2014-02-17 DIAGNOSIS — IMO0001 Reserved for inherently not codable concepts without codable children: Secondary | ICD-10-CM

## 2014-02-17 DIAGNOSIS — I4891 Unspecified atrial fibrillation: Secondary | ICD-10-CM

## 2014-02-17 DIAGNOSIS — F0390 Unspecified dementia without behavioral disturbance: Secondary | ICD-10-CM

## 2014-02-17 DIAGNOSIS — I509 Heart failure, unspecified: Secondary | ICD-10-CM

## 2014-02-17 DIAGNOSIS — I6350 Cerebral infarction due to unspecified occlusion or stenosis of unspecified cerebral artery: Secondary | ICD-10-CM

## 2014-02-17 DIAGNOSIS — I10 Essential (primary) hypertension: Secondary | ICD-10-CM

## 2014-02-17 DIAGNOSIS — J9610 Chronic respiratory failure, unspecified whether with hypoxia or hypercapnia: Secondary | ICD-10-CM

## 2014-02-17 DIAGNOSIS — I5033 Acute on chronic diastolic (congestive) heart failure: Secondary | ICD-10-CM

## 2014-02-17 DIAGNOSIS — I639 Cerebral infarction, unspecified: Secondary | ICD-10-CM

## 2014-02-17 DIAGNOSIS — E1165 Type 2 diabetes mellitus with hyperglycemia: Secondary | ICD-10-CM

## 2014-02-17 DIAGNOSIS — R1319 Other dysphagia: Secondary | ICD-10-CM

## 2014-02-20 ENCOUNTER — Encounter: Payer: Self-pay | Admitting: Adult Health

## 2014-02-20 NOTE — Progress Notes (Signed)
Patient ID: Savannah [name], female   DOB: 08/10/1919, 80 y.o.   MRN: [id number]     [facility]  No Known Allergies   Chief Complaint  Patient presents with  . Medical Managment of Chronic Issues    HPI:  She is being seen for the management of her chronic illnesses. She is followed by hospice care. There are no concerns being voiced by the nursing staff at this time. She is unable to fully participate in the hpi or ros.    Past Medical History  Diagnosis Date  . Hypertension   . Hypercholesteremia   . Pelvis fracture   . Kidney stones   . Hyperglycemia   . Diabetes mellitus without complication   . CVA (cerebral infarction)   . Hypoxia     Past Surgical History  Procedure Laterality Date  . Abdominal hysterectomy      VITAL SIGNS BP 145/79  Pulse 87  Ht 5\' 3"  (1.6 m)  Wt 166 lb 11.2 oz (75.615 kg)  BMI 29.54 kg/m2   Patient's Medications  New Prescriptions   No medications on file  Previous Medications   ALBUTEROL (PROVENTIL) (2.5 MG/3ML) 0.083% NEBULIZER SOLUTION    Take 3 mLs (2.5 mg total) by nebulization every 2 (two) hours as needed for wheezing or shortness of breath.   BISACODYL (DULCOLAX) 10 MG SUPPOSITORY    Place 10 mg rectally 3 (three) times a week.   FEEDING SUPPLEMENT, ENSURE, (ENSURE) PUDG    Take 1 Container by mouth 3 (three) times daily between meals.   FOOD THICKENER (THICK IT) POWD    Take 1 Container by mouth as needed. NECTAR THICK   IPRATROPIUM-ALBUTEROL (DUONEB) 0.5-2.5 (3) MG/3ML SOLN    Take 3 mLs by nebulization 3 (three) times daily.    MORPHINE SULFATE (MORPHINE CONCENTRATE) 10 MG / 0.5 ML CONCENTRATED SOLUTION    Place 0.25 mLs (5 mg total) under the tongue every 4 (four) hours as needed for shortness of breath (pain).   NITROGLYCERIN (NITROSTAT) 0.4 MG SL TABLET    Place 1 tablet (0.4 mg total) under the tongue every 5 (five) minutes as needed for chest pain.   NON FORMULARY    Place 2 L into the nose continuous. 02  Modified  Medications   No medications on file  Discontinued Medications   No medications on file    SIGNIFICANT DIAGNOSTIC EXAMS  01-05-14: acute abdomen: Bilateral basilar atelectasis or infiltrate. No pulmonary edema. Mild distended bowel loops in right abdomen suspicious for ileus, partial obstruction or enteritis. No free abdominal air.   01-06-14: chest x-ray: Progression of bibasilar atelectasis/infiltrate and bilateral effusions. Mild fluid overload also possible  01-06-14: TEE: Left ventricle: The cavity size was normal. There was mild focal basal hypertrophy of the septum. Systolic function was vigorous. The estimated ejection fraction was in the range of 65% to 70%. Wall motion was normal; there were noregional wall motion abnormalities. Doppler parameters are consistent with high ventricular filling pressure. - Aortic valve: Mild regurgitation.  - Mitral valve: Calcified annulus. The findings are consistent with mild stenosis.  - Left atrium: The atrium was mildly dilated. - Right atrium: The atrium was mildly dilated. - Pulmonary arteries: Systolic pressure was mildly increased.   01-10-14: ct of head: Findings consistent with  larger right MCA territory infarction. Mild mass effect upon the right ventricle. No evidence of hemorrhage.  01-10-14: chest x-ray: There has been mild interval improvement in the appearance of the pulmonary interstitium bilaterally.  There remains atelectasis or pneumonia at the left lung base. The right lung base is clearer today.  01-15-14: chest x-ray; 1. Decreasing lung volumes with worsening bibasilar (left greater than right) atelectasis and/or consolidation. 2. Increasing moderate left pleural effusion and small right pleural Effusion. 3. Cardiomegaly with evidence of mild congestive heart failure, as above. 4. Atherosclerosis. 5. Large hiatal hernia.     LABS REVIEWED:   01-05-14: wbc 10.2; hgb 12.4; hct 39.8; mcv 80.4 plt 233; glucose 134; bun 13; creat 0.69;  k+4.5; na++140; liver normal albumin 2.5; urine culture: no growth 01-10-14: wbc 7.6;hgb 11.4; hct 38.5; mcv 82.4;plt 333; glucose 117; bun 26; creat 0.82; k+3.1; na++148 C02 38 01-11-14: wbc 7.9; hgb 11.5; hct 38.8; mcv 83.1; plt 317; glucose 110; bun 26; creat 0.76; k+3.6; na++145; C02 37 01-15-14: glucose 142; bun 17; creat 0.58; k+3.7; na++ 138; C02 39      Review of Systems  Unable to perform ROS   Physical Exam  Constitutional: No distress.  She does not appear to be in pain  Neck: Neck supple. No JVD present.  Cardiovascular: Normal rate, regular rhythm and intact distal pulses.   Respiratory: Effort normal. No respiratory distress. She has no wheezes.  Breath sounds diminished throughout  GI: Soft. Bowel sounds are normal. She exhibits no distension. There is no tenderness.  Musculoskeletal: She exhibits no edema.  Is able to move extremities has foley  Neurological:  Will respond to name  Skin: Skin is warm and dry. She is not diaphoretic.      ASSESSMENT/ PLAN:  1. Chronic respiratory failure; her status at this time is without change. She is on chronic 02 at 2 liters. Is taking duoneb three times daily and has albuterol neb every 2 hours as needed.  She has roxanol 5 mg every 4 hours as needed for pain or distress will not make changes at this time and will monitor her status.   2. Diastolic heart failure: she is presently without change in status; she is not on medications at this time. She has ntg as needed for chest pain. Will continue to monitor her status.   3. Dementia: she continues to decline; will respond to name at times; is presently not on medications; will not make changes and will monitor her status.   4. CVA; no change in her status; will continue to focus her care upon comfort and will monitor her status.   5. Hypertension: is stable is not on medications will not make changes will monitor her status.   6. Dysphagia: no outward sign of aspiration present;  will continue nectar thick liquids at this time.

## 2014-03-14 ENCOUNTER — Non-Acute Institutional Stay (SKILLED_NURSING_FACILITY): Payer: Medicare Other | Admitting: Adult Health

## 2014-03-14 DIAGNOSIS — F0390 Unspecified dementia without behavioral disturbance: Secondary | ICD-10-CM

## 2014-03-14 DIAGNOSIS — I5033 Acute on chronic diastolic (congestive) heart failure: Secondary | ICD-10-CM

## 2014-03-14 DIAGNOSIS — I6350 Cerebral infarction due to unspecified occlusion or stenosis of unspecified cerebral artery: Secondary | ICD-10-CM

## 2014-03-14 DIAGNOSIS — I509 Heart failure, unspecified: Secondary | ICD-10-CM

## 2014-03-14 DIAGNOSIS — I639 Cerebral infarction, unspecified: Secondary | ICD-10-CM

## 2014-03-14 DIAGNOSIS — J9610 Chronic respiratory failure, unspecified whether with hypoxia or hypercapnia: Secondary | ICD-10-CM

## 2014-03-15 ENCOUNTER — Encounter: Payer: Self-pay | Admitting: Adult Health

## 2014-03-15 NOTE — Progress Notes (Signed)
Patient ID: Savannah [name], female   DOB: 11/10/1918, 80 y.o.   MRN: [id number]     [facility]  No Known Allergies   Chief Complaint  Patient presents with  . Medical Management of Chronic Issues    HPI:  She is being seen for the management of her chronic illnesses. She continues to be followed by hospice care. She has declined and is actively dying. She is not responsive to stimuli at this time. The hospice nurse is aware and has spoken to her family. There are no signs of distress of at this time.   Past Medical History  Diagnosis Date  . Hypertension   . Hypercholesteremia   . Pelvis fracture   . Kidney stones   . Hyperglycemia   . Diabetes mellitus without complication   . CVA (cerebral infarction)   . Hypoxia     Past Surgical History  Procedure Laterality Date  . Abdominal hysterectomy      VITAL SIGNS BP 122/65  Pulse 68  Ht 5\' 3"  (1.6 m)  Wt 166 lb 11.2 oz (75.615 kg)  BMI 29.54 kg/m2   Patient's Medications  New Prescriptions   No medications on file  Previous Medications   BISACODYL (DULCOLAX) 10 MG SUPPOSITORY    Place 10 mg rectally 3 (three) times a week.   FOOD THICKENER (THICK IT) POWD    Take 1 Container by mouth as needed. NECTAR THICK   NITROGLYCERIN (NITROSTAT) 0.4 MG SL TABLET    Place 1 tablet (0.4 mg total) under the tongue every 5 (five) minutes as needed for chest pain.   NON FORMULARY    Place 2 L into the nose continuous. 02   OXYCODONE (ROXICODONE INTENSOL) 20 MG/ML CONCENTRATED SOLUTION    Take 10 mg by mouth every 4 (four) hours as needed for severe pain. Twice daily and every 4 hours as needed  Modified Medications   No medications on file  Discontinued Medications   ALBUTEROL (PROVENTIL) (2.5 MG/3ML) 0.083% NEBULIZER SOLUTION    Take 3 mLs (2.5 mg total) by nebulization every 2 (two) hours as needed for wheezing or shortness of breath.   FEEDING SUPPLEMENT, ENSURE, (ENSURE) PUDG    Take 1 Container by mouth 3 (three) times daily  between meals.   IPRATROPIUM-ALBUTEROL (DUONEB) 0.5-2.5 (3) MG/3ML SOLN    Take 3 mLs by nebulization 3 (three) times daily.    MORPHINE SULFATE (MORPHINE CONCENTRATE) 10 MG / 0.5 ML CONCENTRATED SOLUTION    Place 0.25 mLs (5 mg total) under the tongue every 4 (four) hours as needed for shortness of breath (pain).    SIGNIFICANT DIAGNOSTIC EXAMS  01-05-14: acute abdomen: Bilateral basilar atelectasis or infiltrate. No pulmonary edema. Mild distended bowel loops in right abdomen suspicious for ileus, partial obstruction or enteritis. No free abdominal air.   01-06-14: chest x-ray: Progression of bibasilar atelectasis/infiltrate and bilateral effusions. Mild fluid overload also possible  01-06-14: TEE: Left ventricle: The cavity size was normal. There was mild focal basal hypertrophy of the septum. Systolic function was vigorous. The estimated ejection fraction was in the range of 65% to 70%. Wall motion was normal; there were noregional wall motion abnormalities. Doppler parameters are consistent with high ventricular filling pressure. - Aortic valve: Mild regurgitation.  - Mitral valve: Calcified annulus. The findings are consistent with mild stenosis.  - Left atrium: The atrium was mildly dilated. - Right atrium: The atrium was mildly dilated. - Pulmonary arteries: Systolic pressure was mildly increased.  01-10-14: ct of head: Findings consistent with  larger right MCA territory infarction. Mild mass effect upon the right ventricle. No evidence of hemorrhage.  01-10-14: chest x-ray: There has been mild interval improvement in the appearance of the pulmonary interstitium bilaterally. There remains atelectasis or pneumonia at the left lung base. The right lung base is clearer today.  01-15-14: chest x-ray; 1. Decreasing lung volumes with worsening bibasilar (left greater than right) atelectasis and/or consolidation. 2. Increasing moderate left pleural effusion and small right pleural Effusion. 3.  Cardiomegaly with evidence of mild congestive heart failure, as above. 4. Atherosclerosis. 5. Large hiatal hernia.     LABS REVIEWED:   01-05-14: wbc 10.2; hgb 12.4; hct 39.8; mcv 80.4 plt 233; glucose 134; bun 13; creat 0.69; k+4.5; na++140; liver normal albumin 2.5; urine culture: no growth 01-10-14: wbc 7.6;hgb 11.4; hct 38.5; mcv 82.4;plt 333; glucose 117; bun 26; creat 0.82; k+3.1; na++148 C02 38 01-11-14: wbc 7.9; hgb 11.5; hct 38.8; mcv 83.1; plt 317; glucose 110; bun 26; creat 0.76; k+3.6; na++145; C02 37 01-15-14: glucose 142; bun 17; creat 0.58; k+3.7; na++ 138; C02 39      Review of Systems  Unable to perform ROS   Physical Exam  Constitutional: No distress.  She does not appear to be in pain  Neck: Neck supple. No JVD present.  Cardiovascular: Normal rate, regular rhythm and intact distal pulses.   Respiratory: Effort normal. No respiratory distress. She has no wheezes.  Breath sounds diminished throughout  GI: Soft. Bowel sounds are normal. She exhibits no distension. There is no tenderness.  Musculoskeletal: She exhibits no edema.  Neurological:  Unresponsive   Skin: Skin is warm and dry. She is not diaphoretic.      ASSESSMENT/ PLAN:  1. Chronic respiratory failure; her status is worse . She is on chronic 02 at 2 liters. Will continue her oxyfast at 10 mg twice daily and will change her prn dose to 10 mg every 2 hours as needed and will monitor her status.   2. Diastolic heart failure: she is presently without change in status; she is not on medications at this time. She has ntg as needed for chest pain. Will continue to monitor her status.   3. Dementia: she continues to decline; will respond to name at times; is presently not on medications; will not make changes and will monitor her status.   4. CVA; no change in her status; will continue to focus her care upon comfort and will monitor her status.   5. Hypertension: is stable is not on medications will not make  changes will monitor her status.   6. Dysphagia: no outward sign of aspiration present; will continue nectar thick liquids at this time.

## 2014-04-10 DEATH — deceased

## 2014-10-03 IMAGING — CT CT HEAD W/O CM
1 series · 16 of 30 positions shown, 20 images · non-contrast
Comparison: CT HEAD W/O CM dated 08/23/2013

CLINICAL DATA: Altered mental status, lethargy

EXAM:
CT HEAD WITHOUT CONTRAST
TECHNIQUE: Contiguous axial images were obtained from the base of the skull
through the vertex without intravenous contrast.

[Series 2: head 5.0 h30s · axial · 0.44mm/px · z∈[-166,-21]mm · 16 of 33 slices shown, 20 images]
[im 2/33  brain]
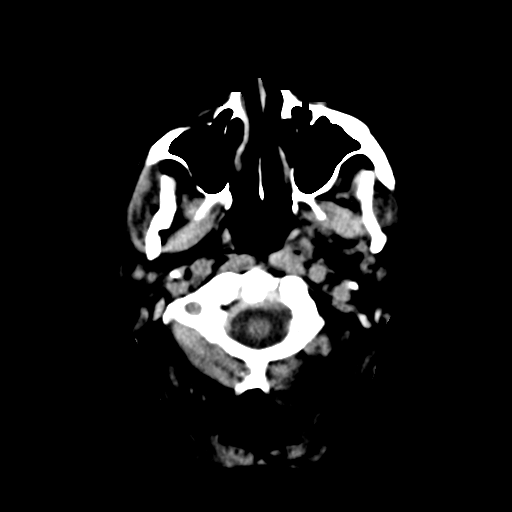
[im 2/33  bone]
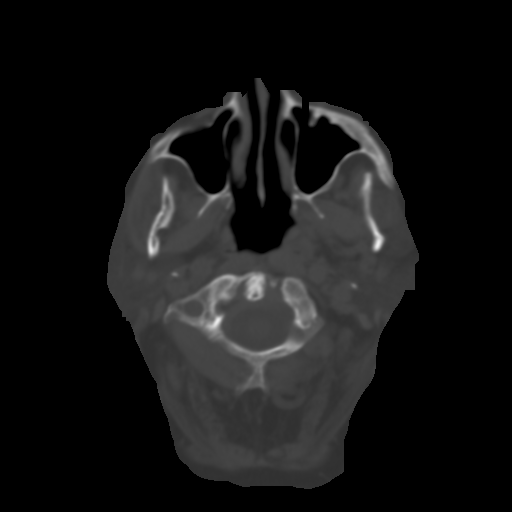
[im 4/33  brain]
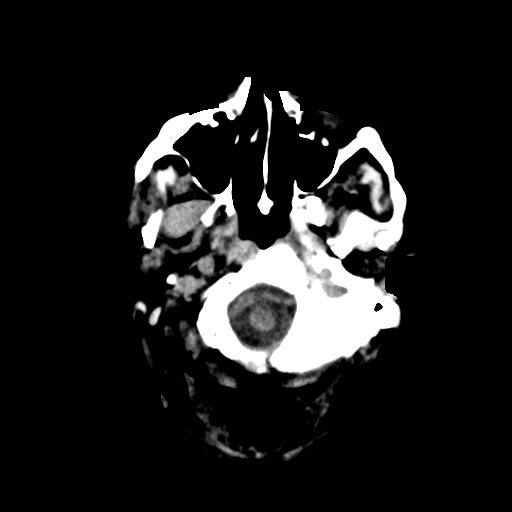
[im 6/33  brain]
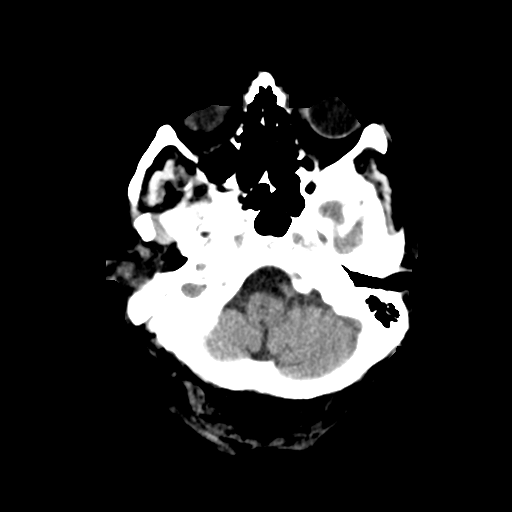
[im 8/33  brain]
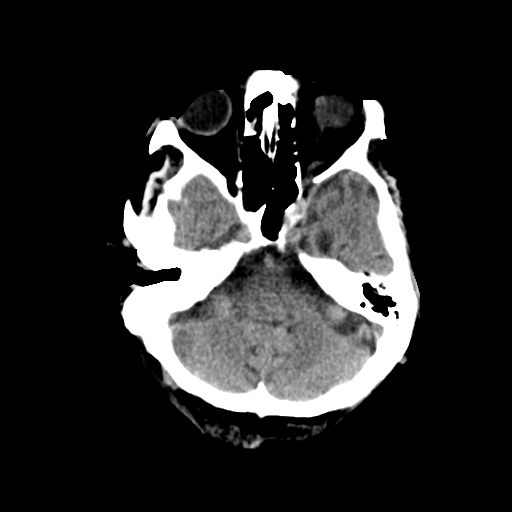
[im 9/33  brain]
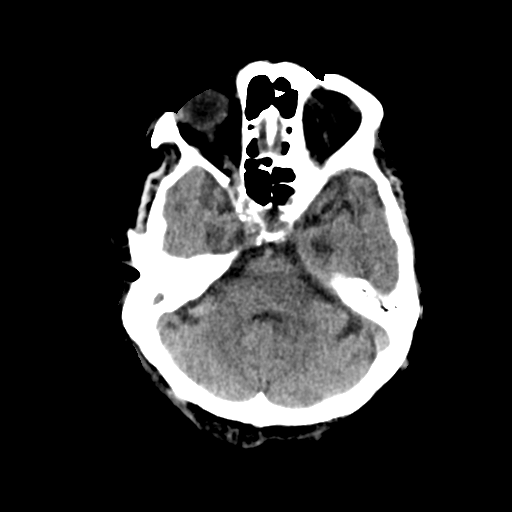
[im 9/33  bone]
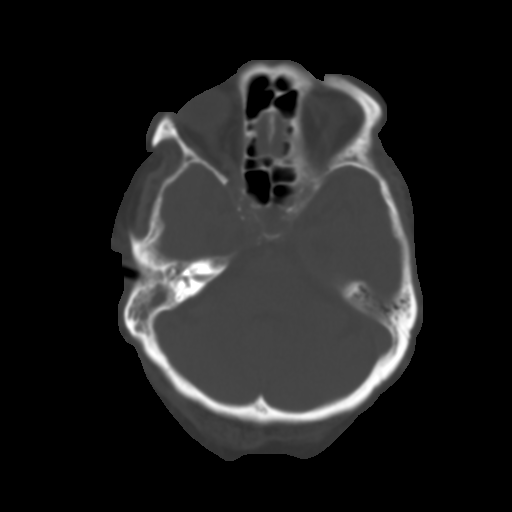
[im 12/33  brain]
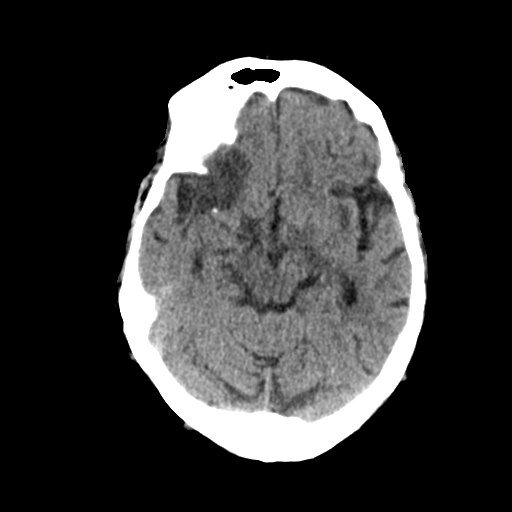
[im 14/33  brain]
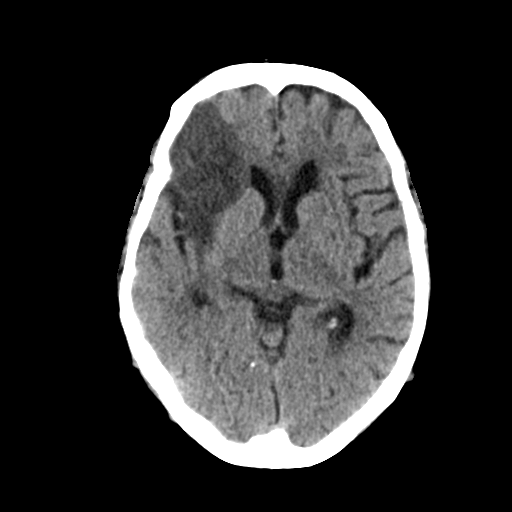
[im 16/33  brain]
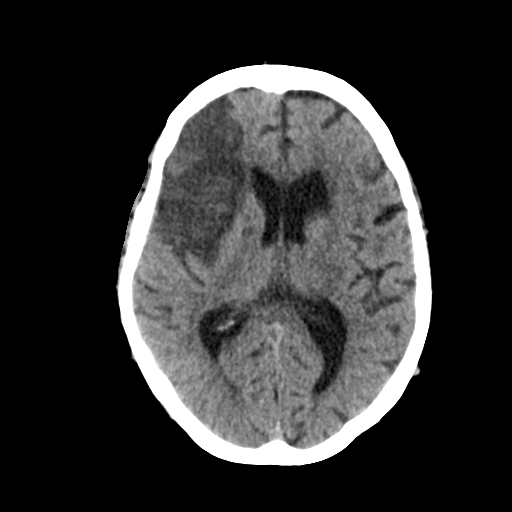
[im 17/33  brain]
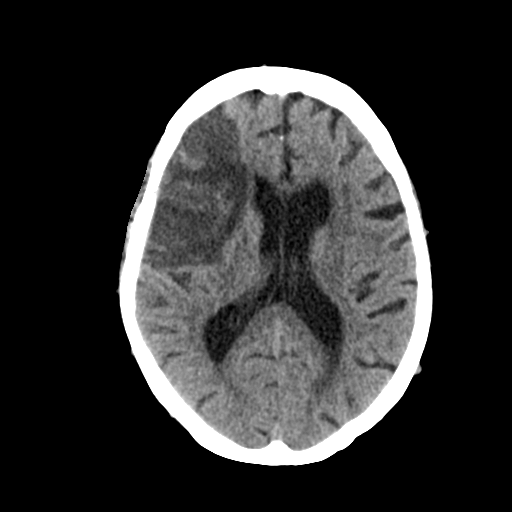
[im 17/33  bone]
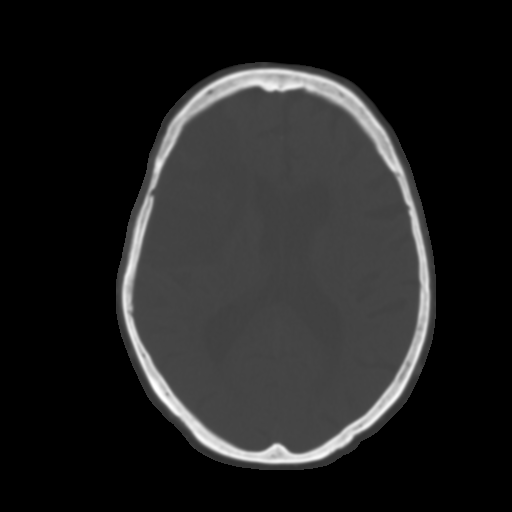
[im 19/33  brain]
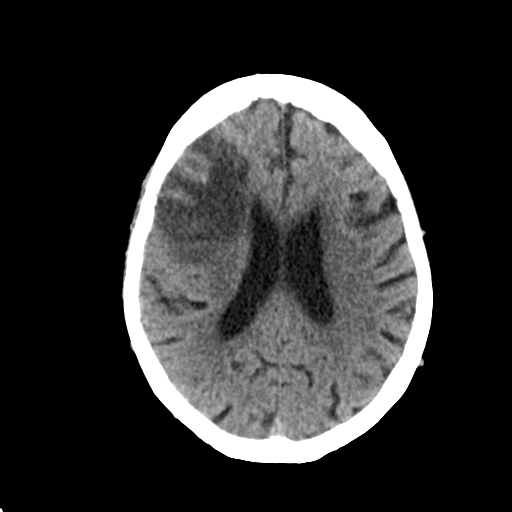
[im 21/33  brain]
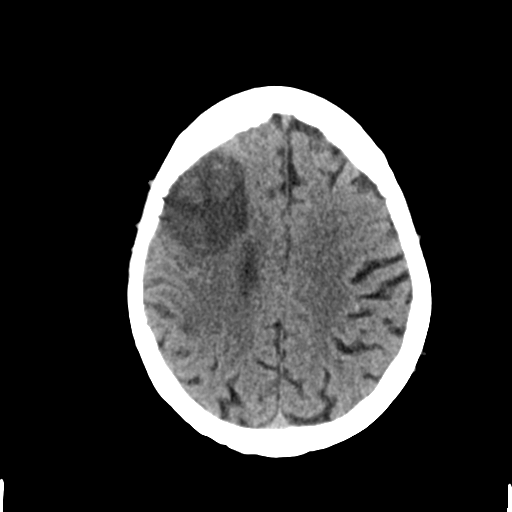
[im 24/33  brain]
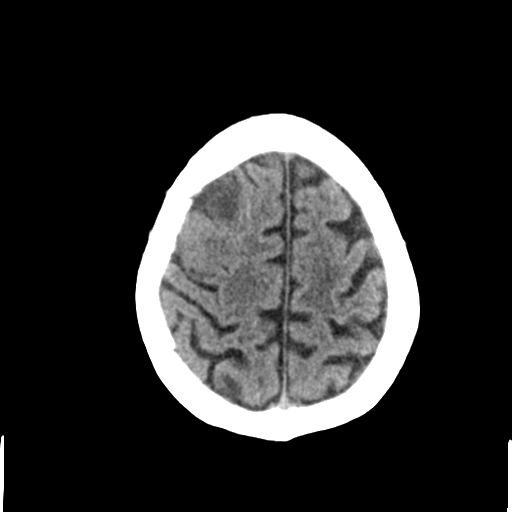
[im 25/33  brain]
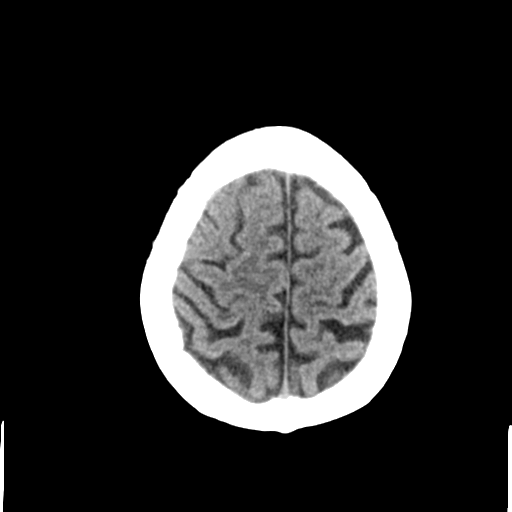
[im 25/33  bone]
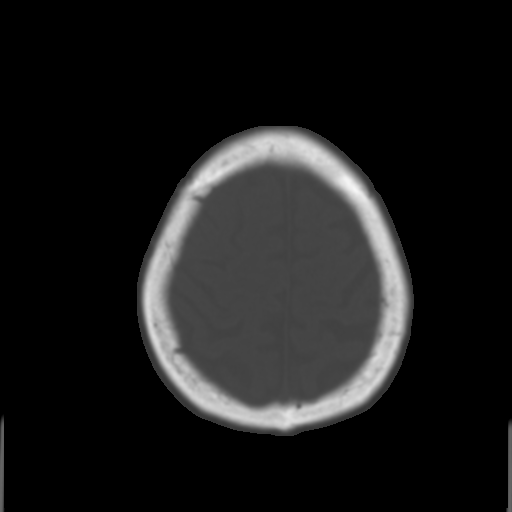
[im 27/33  brain]
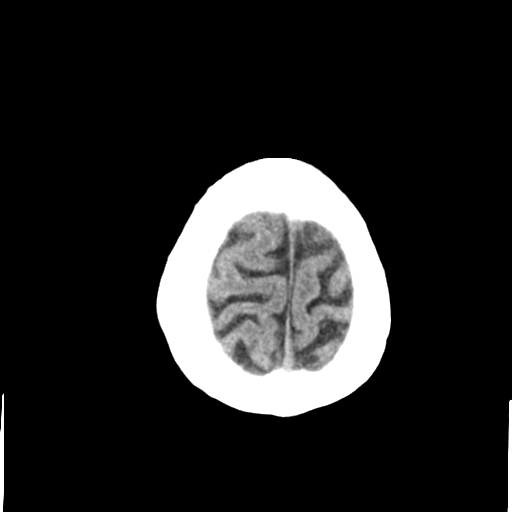
[im 29/33  brain]
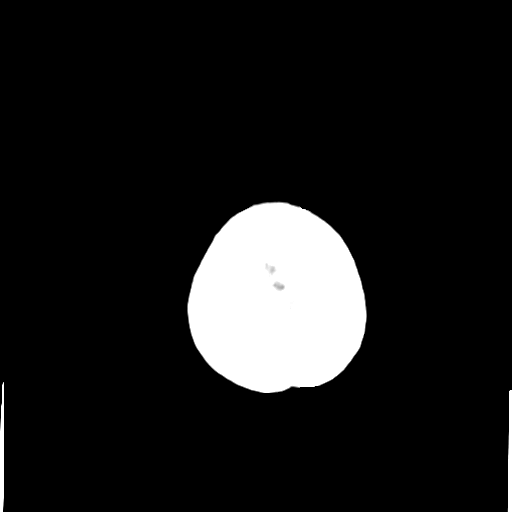
[im 31/33  brain]
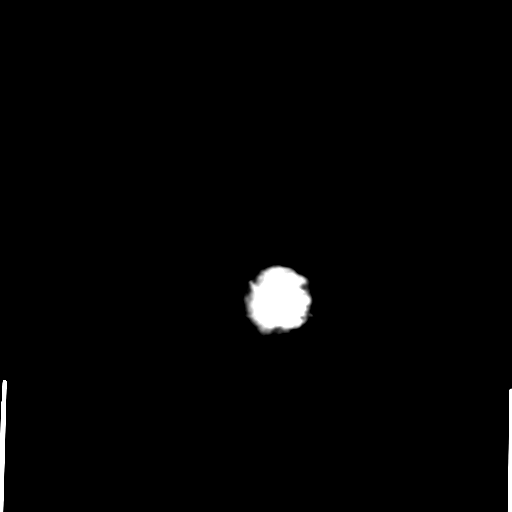

[16 of 30 positions shown; findings below may reference images not displayed]

FINDINGS: There is a large geographic region of low attenuation within the
right frontotemporal lobe involving the white matter and cortical
gray matter most consistent with a right MCA acute infarction. There
is no evidence of hemorrhage. There is mild compression of the
anterior horn of the right ventricle from edema from the presumed
infarction.

The generalized cortical atrophy. Periventricular subcortical white
matter hypodensities. Paranasal sinuses and mastoid air cells are
clear.
IMPRESSION: Findings consistent with  larger right MCA territory infarction.

Mild mass effect upon the right ventricle.

No evidence of hemorrhage.

Findings conveyed [place], [place] 01/10/2014  at[DATE].

## 2014-10-08 IMAGING — CR DG CHEST 1V PORT
1 series · 1 of 1 positions shown · non-contrast
Comparison: Chest x-ray 01/12/2014.

CLINICAL DATA: Dyspnea.

EXAM:
PORTABLE CHEST - 1 VIEW

[AP]
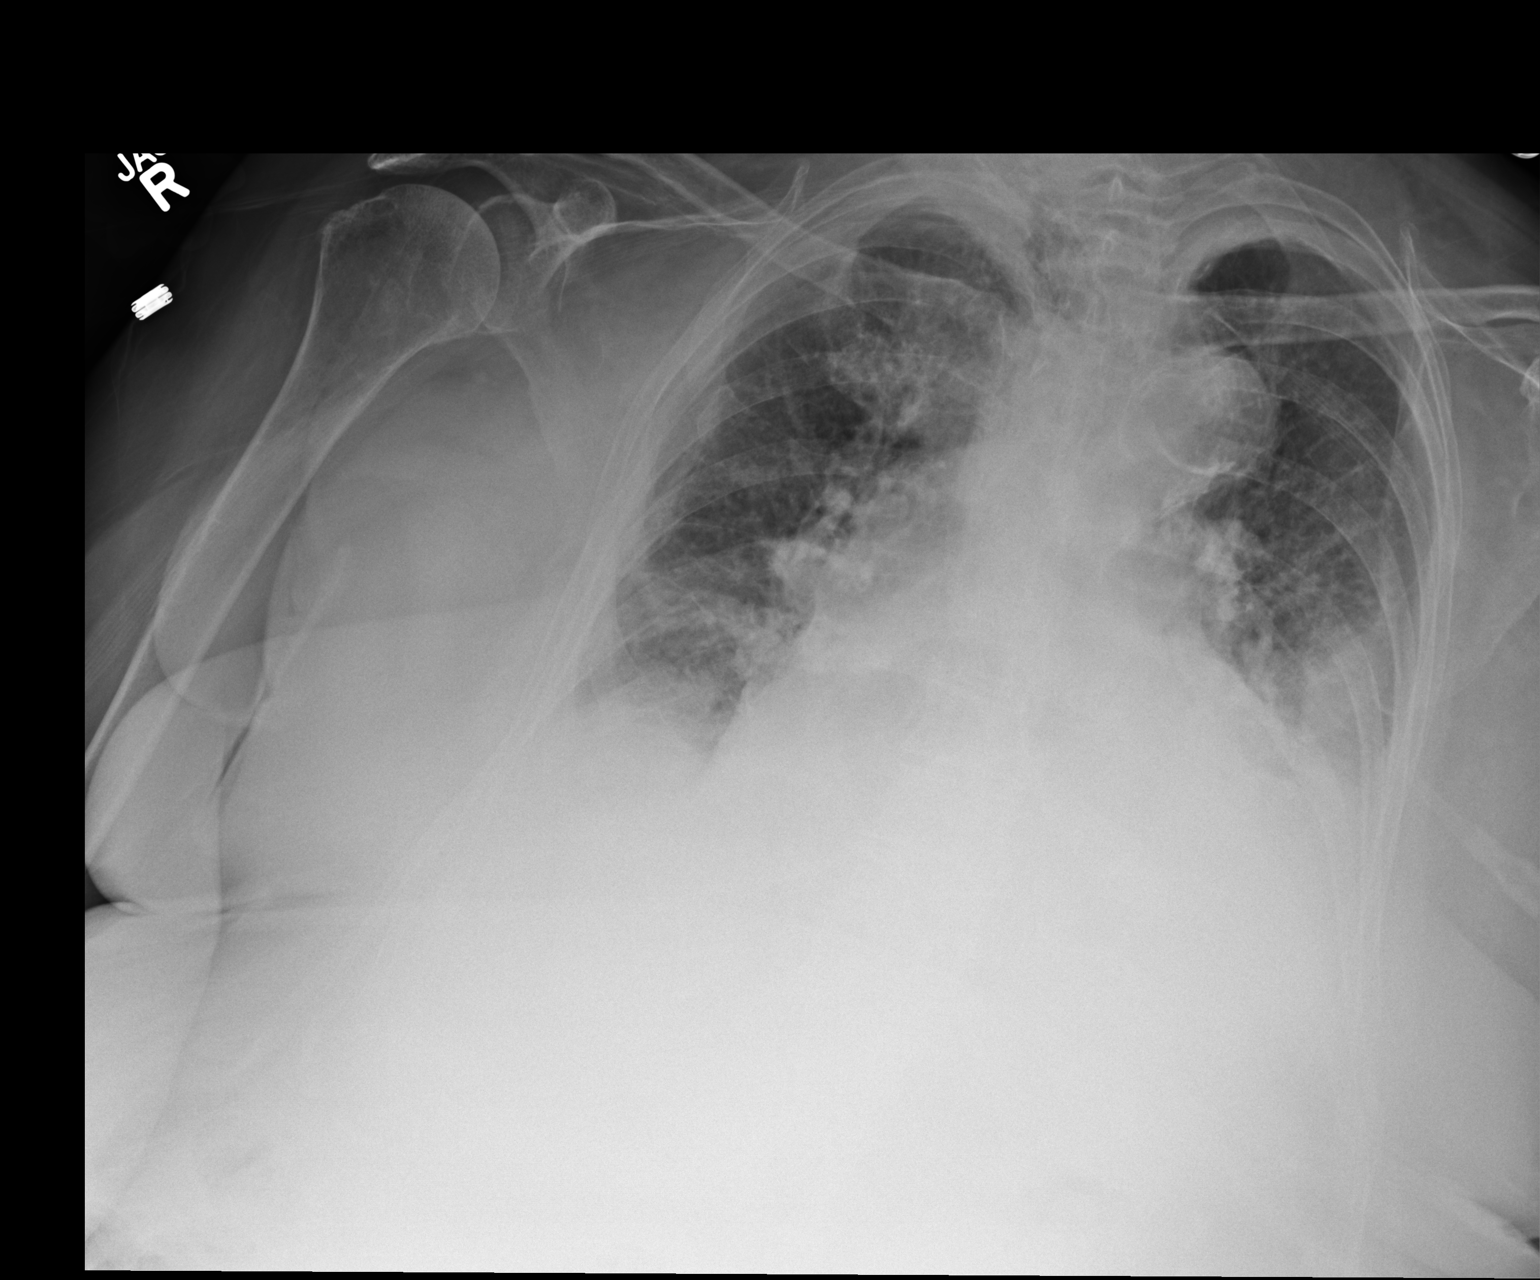

[1 of 1 positions shown; findings below may reference images not displayed]

FINDINGS: Lung volumes are very low. There are bibasilar opacities (left
greater than right), which have increased, and are compatible with
worsening areas of atelectasis and/or consolidation. Possible small
right and moderate left-sided pleural effusions. Pulmonary venous
congestion. Indistinct interstitial markings may suggest mild
interstitial pulmonary edema. Enlargement of the cardiopericardial
silhouette may in part be positional, and in part related to the
presence of a large hiatal hernia (retrocardiac lucency again noted,
compatible with a large hiatal hernia). Mediastinal contours are
grossly distorted by patient positioning. Atherosclerosis in the
thoracic aorta.
IMPRESSION: 1. Decreasing lung volumes with worsening bibasilar (left greater
than right) atelectasis and/or consolidation.
2. Increasing moderate left pleural effusion and small right pleural
effusion.
3. Cardiomegaly with evidence of mild congestive heart failure, as
above.
4. Atherosclerosis.
5. Large hiatal hernia.
